# Patient Record
Sex: Male | Born: 1955 | Race: Black or African American | Hispanic: No | Marital: Married | Smoking: Never smoker
Health system: Southern US, Community
[De-identification: ages and names within clinical notes are randomized; demographics above are authoritative.]

## PROBLEM LIST (undated history)

## (undated) DIAGNOSIS — K219 Gastro-esophageal reflux disease without esophagitis: Secondary | ICD-10-CM

## (undated) DIAGNOSIS — I509 Heart failure, unspecified: Secondary | ICD-10-CM

## (undated) DIAGNOSIS — N400 Enlarged prostate without lower urinary tract symptoms: Secondary | ICD-10-CM

## (undated) DIAGNOSIS — I10 Essential (primary) hypertension: Secondary | ICD-10-CM

## (undated) DIAGNOSIS — E785 Hyperlipidemia, unspecified: Secondary | ICD-10-CM

## (undated) HISTORY — PX: PROSTATE SURGERY: SHX751

## (undated) HISTORY — DX: Benign prostatic hyperplasia without lower urinary tract symptoms: N40.0

## (undated) HISTORY — DX: Essential (primary) hypertension: I10

## (undated) HISTORY — PX: CYSTOSCOPY WITH INSERTION OF UROLIFT: SHX6678

## (undated) HISTORY — PX: LASIK: SHX215

## (undated) HISTORY — PX: OTHER SURGICAL HISTORY: SHX169

## (undated) HISTORY — DX: Hyperlipidemia, unspecified: E78.5

## (undated) HISTORY — PX: CARPAL TUNNEL RELEASE: SHX101

---

## 2009-01-18 DIAGNOSIS — I639 Cerebral infarction, unspecified: Secondary | ICD-10-CM

## 2009-01-18 HISTORY — DX: Cerebral infarction, unspecified: I63.9

## 2019-05-17 ENCOUNTER — Telehealth: Payer: Self-pay | Admitting: Medical

## 2019-05-17 NOTE — Telephone Encounter (Signed)
Pt's wife called to schedule this pt for a new appt. She states that pt is having issues with balance and walking. She states that pt has a history of stroke. Wife was advise that pt needed to be assessed today at ER due to his stroke history. She made him a new pt appt with Vincenza Hews for next week anyway. She was advised again to take him to ER.

## 2019-05-24 ENCOUNTER — Encounter: Payer: Self-pay | Admitting: Medical

## 2019-05-24 ENCOUNTER — Other Ambulatory Visit: Payer: Self-pay

## 2019-05-24 ENCOUNTER — Ambulatory Visit: Payer: BC Managed Care – PPO | Admitting: Medical

## 2019-05-24 VITALS — BP 122/84 | HR 81 | Temp 97.9°F | Ht 68.75 in | Wt 168.4 lb

## 2019-05-24 DIAGNOSIS — R42 Dizziness and giddiness: Secondary | ICD-10-CM

## 2019-05-24 DIAGNOSIS — N401 Enlarged prostate with lower urinary tract symptoms: Secondary | ICD-10-CM | POA: Insufficient documentation

## 2019-05-24 DIAGNOSIS — N529 Male erectile dysfunction, unspecified: Secondary | ICD-10-CM

## 2019-05-24 DIAGNOSIS — Z8673 Personal history of transient ischemic attack (TIA), and cerebral infarction without residual deficits: Secondary | ICD-10-CM

## 2019-05-24 DIAGNOSIS — R002 Palpitations: Secondary | ICD-10-CM | POA: Diagnosis not present

## 2019-05-24 DIAGNOSIS — H6501 Acute serous otitis media, right ear: Secondary | ICD-10-CM

## 2019-05-24 DIAGNOSIS — R270 Ataxia, unspecified: Secondary | ICD-10-CM

## 2019-05-24 DIAGNOSIS — R350 Frequency of micturition: Secondary | ICD-10-CM

## 2019-05-24 DIAGNOSIS — I10 Essential (primary) hypertension: Secondary | ICD-10-CM

## 2019-05-24 MED ORDER — MECLIZINE HCL 25 MG PO TABS
25.0000 mg | ORAL_TABLET | Freq: Two times a day (BID) | ORAL | 0 refills | Status: DC
Start: 2019-05-24 — End: 2019-07-09

## 2019-05-24 NOTE — Progress Notes (Signed)
Subjective: Chief Complaint  Patient presents with  . New Patient (Initial Visit)  . Difficulty Walking    balance issuse- feels like falling over    Here as a new patient.   Was living in [place] prior.  Moved here a year ago.  From [place] originally, moved here in 1991.  He notes having a stroke 9 years ago.  Since then he has been doing yearly physical.   Is past due currently for physical given covid.    In recent weeks he notes not feeling balanced when walking.   Recently when standing, felt he has leaning to the left.  His coworker asked if he was feeling ok.  He could tell something was off.  Few days ago coming out of his office, had sudden dizzy spell, lasted 30 seconds.    He notes for the past year, will get a jolt of jumpy feeling of the heart.  Has had this several times this past year but worse recently.  Was once weekly but now this is more frequent.    He had a treadmill stress test almost 2 years ago, normal.    No recently numbness or tingling.  He notes hx/o enlarged prostate.  No incontinence of urine or bowel in general.   He does have prostate issues and uses medicaiton to prevent nocturia.   No vision or hearing change, no slurred speech, no confusion.   No significant headaches.  Drinks no alcohol, nonsmoker lifetime.   Since his stroke 9 years ago he can get some muscle twitching, right index finger will lock up at times, sometimes gets right thigh spasm.  He notes for 3 months having problems getting and keeping erections.  New problem  No CP, no sob, no syncope.  Past Medical History:  Diagnosis Date  . BPH (benign prostatic hyperplasia)   . CVA (cerebral vascular accident) (HCC) 2011  . Hyperlipidemia   . Hypertension    Current Outpatient Medications on File Prior to Visit  Medication Sig Dispense Refill  . aspirin EC 81 MG tablet Take 81 mg by mouth daily.    Marland Kitchen lisinopril-hydrochlorothiazide (ZESTORETIC) 10-12.5 MG tablet Take 1 tablet by mouth  daily.    . simvastatin (ZOCOR) 80 MG tablet Take 80 mg by mouth daily.    . tamsulosin (FLOMAX) 0.4 MG CAPS capsule Take 0.4 mg by mouth.     No current facility-administered medications on file prior to visit.   Family History  Problem Relation Age of Onset  . Hypertension Mother   . Benign prostatic hyperplasia Father   . Other Father        died after horrible bed sore and demise  . Cancer Sister   . Heart disease Neg Hx   . Stroke Neg Hx      ROS as in subjective    Objective: BP 122/84   Pulse 81   Temp 97.9 F (36.6 C)   Ht 5' 8.75" (1.746 m)   Wt 168 lb 6.4 oz (76.4 kg)   SpO2 98%   BMI 25.05 kg/m    General appearence: alert, no distress, WD/WN, [place] American male HEENT: normocephalic, sclerae anicteric, PERRLA, EOMi, nares patent, right TM with serous fluid, left TM and canal normal, no discharge or erythema, pharynx normal Oral cavity: MMM, no lesions Neck: supple, no lymphadenopathy, no thyromegaly, no masses, no JVD, no bruits Heart: RRR, normal S1, S2, no murmurs Lungs: CTA bilaterally, no wheezes, rhonchi, or rales Extremities: no edema, no  cyanosis, no clubbing Pulses: 2+ symmetric, upper and lower extremities, normal cap refill Neurological: alert, oriented x 3, CN2-12 intact, strength normal upper extremities and lower extremities, sensation normal throughout, DTRs 2+ throughout, no cerebellar signs, gait normal Psychiatric: normal affect, behavior normal, pleasant    EKG indication dizziness, palpitations, rate 59 bpm, PR 268ms, QRS 9ms, QTC 352ms, axis 51 degrees, sinus bradycardia with 1st degree AV block, minimal voltage for LVH.   No prior EKG to compare   Assessment: Encounter Diagnoses  Name Primary?  . Dizziness Yes  . Ataxia   . Palpitation   . Essential hypertension   . History of stroke   . Benign prostatic hyperplasia with urinary frequency   . Erectile dysfunction, unspecified erectile dysfunction type   . Non-recurrent  acute serous otitis media of right ear      Plan: We discussed his concerns.  Discussed possible cause of his symptoms.  We discussed several things that can cause dizziness and feeling of ataxia.  He has a history of stroke.  He is on medicines for hypertension and high cholesterol.  He also has serious otitis fluid behind the right TM.  He will use a trial of meclizine in the event this helps.  But if symptoms continue we need to consider head CT or MRI  He has some palpitations or ectopic beats based on symptoms.  EKG reviewed showing first-degree AV block.  No prior EKG to review.  We will send to cardiology for event monitor testing/eval given the symptoms and new ED symptoms  Continue current medications  Advise if worse or new symptoms call otherwise.  Labs today.   Girard Cooter was seen today for new patient (initial visit) and difficulty walking.  Diagnoses and all orders for this visit:  Dizziness -     EKG 12-Lead -     Comprehensive metabolic panel -     TSH -     CBC -     Ambulatory referral to Cardiology  Ataxia -     EKG 12-Lead -     Comprehensive metabolic panel -     TSH -     CBC -     Ambulatory referral to Cardiology  Palpitation -     EKG 12-Lead -     Comprehensive metabolic panel -     TSH -     CBC -     Ambulatory referral to Cardiology  Essential hypertension -     EKG 12-Lead -     Comprehensive metabolic panel -     CBC -     Ambulatory referral to Cardiology  History of stroke  Benign prostatic hyperplasia with urinary frequency  Erectile dysfunction, unspecified erectile dysfunction type  Non-recurrent acute serous otitis media of right ear  Other orders -     meclizine (ANTIVERT) 25 MG tablet; Take 1 tablet (25 mg total) by mouth 2 (two) times daily.

## 2019-05-25 LAB — COMPREHENSIVE METABOLIC PANEL
ALT: 21 IU/L (ref 0–44)
AST: 28 IU/L (ref 0–40)
Albumin/Globulin Ratio: 1.4 (ref 1.2–2.2)
Albumin: 4.5 g/dL (ref 3.8–4.8)
Alkaline Phosphatase: 60 IU/L (ref 39–117)
BUN/Creatinine Ratio: 11 (ref 10–24)
BUN: 12 mg/dL (ref 8–27)
Bilirubin Total: 0.5 mg/dL (ref 0.0–1.2)
CO2: 28 mmol/L (ref 20–29)
Calcium: 9.7 mg/dL (ref 8.6–10.2)
Chloride: 99 mmol/L (ref 96–106)
Creatinine, Ser: 1.1 mg/dL (ref 0.76–1.27)
GFR calc Af Amer: 81 mL/min/{1.73_m2} (ref >59)
GFR calc non Af Amer: 70 mL/min/{1.73_m2} (ref >59)
Globulin, Total: 3.3 g/dL (ref 1.5–4.5)
Glucose: 76 mg/dL (ref 65–99)
Potassium: 4.6 mmol/L (ref 3.5–5.2)
Sodium: 139 mmol/L (ref 134–144)
Total Protein: 7.8 g/dL (ref 6.0–8.5)

## 2019-05-25 LAB — CBC
Hematocrit: 45.7 % (ref 37.5–51.0)
Hemoglobin: 14.8 g/dL (ref 13.0–17.7)
MCH: 27.7 pg (ref 26.6–33.0)
MCHC: 32.4 g/dL (ref 31.5–35.7)
MCV: 86 fL (ref 79–97)
Platelets: 196 10*3/uL (ref 150–450)
RBC: 5.34 x10E6/uL (ref 4.14–5.80)
RDW: 13.1 % (ref 11.6–15.4)
WBC: 3.9 10*3/uL (ref 3.4–10.8)

## 2019-05-25 LAB — TSH: TSH: 0.576 u[IU]/mL (ref 0.450–4.500)

## 2019-06-15 NOTE — Progress Notes (Signed)
Date:  06/20/2019   ID:  Adrian Willis, DOB Jan 17, 1956, MRN [id number]  PCP:  Jac Canavan, PA-C  Cardiologist:  Tessa Lerner, DO, [facility] (established care 06/20/2019)  REASON FOR CONSULT: Palpitation  REQUESTING PHYSICIAN:  Jac Canavan, PA-C [address] [place],  [place] [address]  Chief Complaint  Patient presents with  . Palpitations  . Dizziness  . New Patient (Initial Visit)    HPI  Adrian Willis is a 64 y.o. male who is being seen today for the evaluation of palpitations and dizziness at the request of Genia Del. Patient's past medical history and cardiac risk factors include: Hx of stroke, hypertension, hyperlipidemia, advanced age.   Patient presents the office with a chief complaint of palpitations.  Patient states that the symptoms have been going on for approximately 1 year but have been more frequent.  The last episode was last week when he was try to get out of bed.  The symptoms have been randomly without any precipitating factors.  They last for less than a second and are usually self-limited.  Associated symptoms include dizziness and feeling tired during the episodes.  He denies any anginal chest pain, shortness of breath, or syncopal events.  Patient also notes that he has been not drinking enough water on a daily basis and sometimes skips meals as well.  He does not drink any alcohol nor does consume any illicit drugs.  No new medications or energy drinks.  History of stroke (2011). Denies prior history of coronary artery disease, myocardial infarction, congestive heart failure, deep venous thrombosis, pulmonary embolism.  FUNCTIONAL STATUS: Walks a lot at work; however, no exercise program or daily routine.    ALLERGIES: No Known Allergies  MEDICATION LIST PRIOR TO VISIT: Current Meds  Medication Sig  . aspirin EC 81 MG tablet Take 81 mg by mouth daily.  Marland Kitchen lisinopril-hydrochlorothiazide (ZESTORETIC) 10-12.5 MG tablet Take 1 tablet  by mouth daily.  . simvastatin (ZOCOR) 80 MG tablet Take 80 mg by mouth daily.  . tamsulosin (FLOMAX) 0.4 MG CAPS capsule Take 0.4 mg by mouth.     PAST MEDICAL HISTORY: Past Medical History:  Diagnosis Date  . BPH (benign prostatic hyperplasia)   . CVA (cerebral vascular accident) (HCC) 2011  . Hyperlipidemia   . Hypertension     PAST SURGICAL HISTORY: Past Surgical History:  Procedure Laterality Date  . PROSTATE SURGERY    . thumb      FAMILY HISTORY: The patient family history includes Benign prostatic hyperplasia in his father; Cancer in his sister; Hypertension in his mother; Other in his father.  SOCIAL HISTORY:  The patient  reports that he has never smoked. He has never used smokeless tobacco. He reports that he does not drink alcohol or use drugs.  REVIEW OF SYSTEMS: Review of Systems  Constitution: Negative for chills and fever.  HENT: Negative for hoarse voice and nosebleeds.   Eyes: Negative for discharge, double vision and pain.  Cardiovascular: Positive for palpitations. Negative for chest pain, claudication, dyspnea on exertion, leg swelling, near-syncope, orthopnea, paroxysmal nocturnal dyspnea and syncope.  Respiratory: Negative for hemoptysis and shortness of breath.   Musculoskeletal: Negative for muscle cramps and myalgias.  Gastrointestinal: Negative for abdominal pain, constipation, diarrhea, hematemesis, hematochezia, melena, nausea and vomiting.  Neurological: Positive for dizziness. Negative for light-headedness.    PHYSICAL EXAM: Vitals with BMI 06/20/2019 05/24/2019  Height 5\' 8"  5' 8.75"  Weight 170 lbs 168 lbs 6 oz  BMI 25.85  25.06  Systolic 145 122  Diastolic 87 84  Pulse 88 81   Orthostatic VS for the past 72 hrs (Last 3 readings):  Orthostatic BP Patient Position BP Location Cuff Size Orthostatic Pulse  06/20/19 0933 (!) 140/94 Standing Left Arm Normal 94  06/20/19 0932 142/86 Sitting Left Arm -- 85  06/20/19 0931 128/81 Supine Left Arm  Normal 81  06/20/19 0927 -- Sitting Left Arm Normal --     CONSTITUTIONAL: Well-developed and well-nourished. No acute distress.  SKIN: Skin is warm and dry. No rash noted. No cyanosis. No pallor. No jaundice HEAD: Normocephalic and atraumatic.  EYES: No scleral icterus MOUTH/THROAT: Moist oral membranes.  NECK: No JVD present. No thyromegaly noted. No carotid bruits  LYMPHATIC: No visible cervical adenopathy.  CHEST Normal respiratory effort. No intercostal retractions  LUNGS: Clear to auscultation bilaterally.  no stridor. No wheezes. No rales.  CARDIOVASCULAR: Regular rate and rhythm, positive S1-S2, no murmurs rubs or gallops appreciated ABDOMINAL: Soft, nontender, nondistended, positive bowel in all 4 quadrants.  No apparent ascites.  EXTREMITIES: No peripheral edema  HEMATOLOGIC: No significant bruising NEUROLOGIC: Oriented to person, place, and time. Nonfocal. Normal muscle tone.  PSYCHIATRIC: Normal mood and affect. Normal behavior. Cooperative  CARDIAC DATABASE: EKG: 06/20/2019 normal sinus rhythm, 78 bpm, normal axis, no underlying ischemia or injury pattern.  Echocardiogram: None  Stress Testing: Back in 2005.   Heart Catheterization: None  LABORATORY DATA: CBC Latest Ref Rng & Units 05/24/2019  WBC 3.4 - 10.8 x10E3/uL 3.9  Hemoglobin 13.0 - 17.7 g/dL 57.2  Hematocrit 62.0 - 51.0 % 45.7  Platelets 150 - 450 x10E3/uL 196    CMP Latest Ref Rng & Units 05/24/2019  Glucose 65 - 99 mg/dL 76  BUN 8 - 27 mg/dL 12  Creatinine 3.55 - 9.74 mg/dL 1.63  Sodium 845 - 364 mmol/L 139  Potassium 3.5 - 5.2 mmol/L 4.6  Chloride 96 - 106 mmol/L 99  CO2 20 - 29 mmol/L 28  Calcium 8.6 - 10.2 mg/dL 9.7  Total Protein 6.0 - 8.5 g/dL 7.8  Total Bilirubin 0.0 - 1.2 mg/dL 0.5  Alkaline Phos 39 - 117 IU/L 60  AST 0 - 40 IU/L 28  ALT 0 - 44 IU/L 21    Lipid Panel  No results found for: CHOL, TRIG, HDL, CHOLHDL, VLDL, LDLCALC, LDLDIRECT, LABVLDL  No results found for: HGBA1C No  components found for: NTPROBNP Lab Results  Component Value Date   TSH 0.576 05/24/2019    BMP Recent Labs    05/24/19 1212  NA 139  K 4.6  CL 99  CO2 28  GLUCOSE 76  BUN 12  CREATININE 1.10  CALCIUM 9.7  GFRNONAA 70  GFRAA 81    CBC No results for input(s): WBC, RBC, HGB, HCT, PLT, MCV, MCH, MCHC, RDW, LYMPHSABS, MONOABS, EOSABS, BASOSABS in the last 168 hours.  Invalid input(s): NEUTRABS  HEMOGLOBIN A1C No results found for: HGBA1C, MPG  Cardiac Panel (last 3 results) No results for input(s): CKTOTAL, CKMB, TROPONINI, RELINDX in the last 8760 hours. No results for input(s): TROPIPOC in the last 8760 hours.  BNP (last 3 results) No results for input(s): PROBNP in the last 8760 hours.  TSH Recent Labs    05/24/19 1212  TSH 0.576    CHOLESTEROL No results for input(s): CHOL in the last 8760 hours.  Hepatic Function Panel Recent Labs    05/24/19 1212  PROT 7.8  ALBUMIN 4.5  AST 28  ALT 21  ALKPHOS  60  BILITOT 0.5    IMPRESSION:    ICD-10-CM   1. Palpitations  R00.2 PCV ECHOCARDIOGRAM COMPLETE    EXTERNAL ECG MONITOR (UP TO 30 DAYS) ROCT  2. Essential hypertension  I10 EKG 12-Lead  3. Dizziness  R42 PCV CAROTID DUPLEX (BILATERAL)  4. History of stroke  Z86.73 PCV CAROTID DUPLEX (BILATERAL)  5. Mixed hyperlipidemia  E78.2 PCV CAROTID DUPLEX (BILATERAL)     RECOMMENDATIONS: Zeric Baranowski is a 64 y.o. male whose past medical history and cardiac risk factors include:  Hx of stroke, hypertension, hyperlipidemia, advanced age.  Palpitations: Currently patient is asymptomatic in regards to his palpitations. EKG shows normal sinus rhythm without any ventricular ectopy. Echocardiogram will be ordered to evaluate for structural heart disease and left ventricular systolic function. Plan 14-day mobile cardiac ambulatory telemetry to evaluate for underlying arrhythmia and atrial fibrillation. We also discussed talking to his primary care provider on  splitting the antihypertensive medications to 2 tablets so that he can take 1 morning and 1 in the evening. Patient is asked to eat 3 balanced meals heart healthy.  And also to consume about 6 to 8 glasses of water.  History of stroke: Continue aspirin and statin therapy.  The 14-day mobile cardiac amatory telemetry will also help evaluate for underlying arrhythmia as well atrial fibrillation.  Dizziness: We will check carotid duplex.  Educated on changing positions slowly.  Orthostatic vital signs negative.  Mixed hyperlipidemia: Continue statin therapy.  Currently managed by primary team.  FINAL MEDICATION LIST END OF ENCOUNTER: No orders of the defined types were placed in this encounter.   There are no discontinued medications.   Current Outpatient Medications:  .  aspirin EC 81 MG tablet, Take 81 mg by mouth daily., Disp: , Rfl:  .  lisinopril-hydrochlorothiazide (ZESTORETIC) 10-12.5 MG tablet, Take 1 tablet by mouth daily., Disp: , Rfl:  .  simvastatin (ZOCOR) 80 MG tablet, Take 80 mg by mouth daily., Disp: , Rfl:  .  tamsulosin (FLOMAX) 0.4 MG CAPS capsule, Take 0.4 mg by mouth., Disp: , Rfl:  .  meclizine (ANTIVERT) 25 MG tablet, Take 1 tablet (25 mg total) by mouth 2 (two) times daily. (Patient not taking: Reported on 06/20/2019), Disp: 30 tablet, Rfl: 0  Orders Placed This Encounter  Procedures  . EXTERNAL ECG MONITOR (UP TO 30 DAYS) ROCT  . EKG 12-Lead  . PCV ECHOCARDIOGRAM COMPLETE  . PCV CAROTID DUPLEX (BILATERAL)   --Continue cardiac medications as reconciled in final medication list. --Return in about 5 weeks (around 07/25/2019) for review test results and follow up on palpitations. . Or sooner if needed. --Continue follow-up with your primary care physician regarding the management of your other chronic comorbid conditions.  Patient's questions and concerns were addressed to his satisfaction. He voices understanding of the instructions provided during this encounter.    This note was created using a voice recognition software as a result there may be grammatical errors inadvertently enclosed that do not reflect the nature of this encounter. Every attempt is made to correct such errors.  Rex Kras, [place], [facility]  Pager: [phone] Office: [phone]

## 2019-06-20 ENCOUNTER — Encounter: Payer: Self-pay | Admitting: Cardiology

## 2019-06-20 ENCOUNTER — Ambulatory Visit: Payer: BC Managed Care – PPO | Admitting: Cardiology

## 2019-06-20 ENCOUNTER — Other Ambulatory Visit: Payer: Self-pay

## 2019-06-20 VITALS — BP 145/87 | HR 88 | Ht 68.0 in | Wt 170.0 lb

## 2019-06-20 DIAGNOSIS — R42 Dizziness and giddiness: Secondary | ICD-10-CM

## 2019-06-20 DIAGNOSIS — Z8673 Personal history of transient ischemic attack (TIA), and cerebral infarction without residual deficits: Secondary | ICD-10-CM

## 2019-06-20 DIAGNOSIS — R002 Palpitations: Secondary | ICD-10-CM

## 2019-06-20 DIAGNOSIS — E782 Mixed hyperlipidemia: Secondary | ICD-10-CM

## 2019-06-20 DIAGNOSIS — I10 Essential (primary) hypertension: Secondary | ICD-10-CM

## 2019-06-27 ENCOUNTER — Ambulatory Visit: Payer: BC Managed Care – PPO

## 2019-06-27 ENCOUNTER — Other Ambulatory Visit: Payer: Self-pay

## 2019-06-27 ENCOUNTER — Encounter: Payer: BC Managed Care – PPO | Admitting: Medical

## 2019-06-27 DIAGNOSIS — Z8673 Personal history of transient ischemic attack (TIA), and cerebral infarction without residual deficits: Secondary | ICD-10-CM

## 2019-06-27 DIAGNOSIS — R002 Palpitations: Secondary | ICD-10-CM

## 2019-06-27 DIAGNOSIS — R42 Dizziness and giddiness: Secondary | ICD-10-CM

## 2019-06-27 DIAGNOSIS — E782 Mixed hyperlipidemia: Secondary | ICD-10-CM

## 2019-06-28 ENCOUNTER — Encounter: Payer: Self-pay | Admitting: Medical

## 2019-07-02 NOTE — Progress Notes (Signed)
Pt called back and explain his echo results. Pt undertstood

## 2019-07-02 NOTE — Progress Notes (Signed)
Called pt no answer, left a vm to call back

## 2019-07-09 ENCOUNTER — Telehealth: Payer: Self-pay | Admitting: Medical

## 2019-07-09 ENCOUNTER — Ambulatory Visit: Payer: BC Managed Care – PPO | Admitting: Medical

## 2019-07-09 ENCOUNTER — Other Ambulatory Visit: Payer: Self-pay

## 2019-07-09 ENCOUNTER — Encounter: Payer: Self-pay | Admitting: Medical

## 2019-07-09 VITALS — BP 140/80 | HR 88 | Ht 68.5 in | Wt 165.6 lb

## 2019-07-09 DIAGNOSIS — Z1211 Encounter for screening for malignant neoplasm of colon: Secondary | ICD-10-CM

## 2019-07-09 DIAGNOSIS — Z0000 Encounter for general adult medical examination without abnormal findings: Secondary | ICD-10-CM

## 2019-07-09 DIAGNOSIS — Z23 Encounter for immunization: Secondary | ICD-10-CM | POA: Diagnosis not present

## 2019-07-09 DIAGNOSIS — Z7189 Other specified counseling: Secondary | ICD-10-CM | POA: Diagnosis not present

## 2019-07-09 DIAGNOSIS — I10 Essential (primary) hypertension: Secondary | ICD-10-CM

## 2019-07-09 DIAGNOSIS — Z125 Encounter for screening for malignant neoplasm of prostate: Secondary | ICD-10-CM

## 2019-07-09 DIAGNOSIS — E785 Hyperlipidemia, unspecified: Secondary | ICD-10-CM | POA: Insufficient documentation

## 2019-07-09 DIAGNOSIS — Z7185 Encounter for immunization safety counseling: Secondary | ICD-10-CM | POA: Insufficient documentation

## 2019-07-09 MED ORDER — LISINOPRIL-HYDROCHLOROTHIAZIDE 10-12.5 MG PO TABS: 1.0000 | ORAL_TABLET | Freq: Every day | ORAL | 3 refills | Status: DC

## 2019-07-09 MED ORDER — TAMSULOSIN HCL 0.4 MG PO CAPS: 0.4000 mg | ORAL_CAPSULE | Freq: Every day | ORAL | 0 refills | Status: DC

## 2019-07-09 MED ORDER — SIMVASTATIN 80 MG PO TABS: 80.0000 mg | ORAL_TABLET | Freq: Every day | ORAL | 0 refills | Status: DC

## 2019-07-09 MED ORDER — ASPIRIN 325 MG PO TABS: 325.0000 mg | ORAL_TABLET | Freq: Every day | ORAL | 3 refills | Status: DC

## 2019-07-09 NOTE — Telephone Encounter (Signed)
Pt needs refill on all 4 [facility]

## 2019-07-09 NOTE — Addendum Note (Signed)
Addended by: Jac Canavan on: 07/09/2019 05:02 PM   Modules accepted: Orders

## 2019-07-09 NOTE — Progress Notes (Signed)
Subjective:   HPI  Adrian Willis is a 64 y.o. male who presents for Chief Complaint  Patient presents with  . cpe fasting    cpe, wants cholesterol rechecked    Patient Care Team: Merrell Rettinger, Camelia Eng, PA-C as PCP - General (Family Medicine) Sees dentist Sees eye doctor Cardiology, Dr. Rex Kras   Concerns: He recently saw cardiology.  He had an echocardiogram.  He is using event monitor currently  He had his Covid shots already  He has a history of prostate cancer therapy.  Last urology consult 2 years ago.  He had a "urotrap" pellet treatment prior  No new complaints  Reviewed their medical, surgical, family, social, medication, and allergy history and updated chart as appropriate.  Past Medical History:  Diagnosis Date  . BPH (benign prostatic hyperplasia)   . CVA (cerebral vascular accident) ([place]) 2011  . Hyperlipidemia   . Hypertension     Past Surgical History:  Procedure Laterality Date  . PROSTATE SURGERY    . thumb     right, nerve injury repair    Social History   Socioeconomic History  . Marital status: Married    Spouse name: Not on file  . Number of children: Not on file  . Years of education: Not on file  . Highest education level: Not on file  Occupational History  . Not on file  Tobacco Use  . Smoking status: Never Smoker  . Smokeless tobacco: Never Used  Vaping Use  . Vaping Use: Never used  Substance and Sexual Activity  . Alcohol use: Never  . Drug use: Never  . Sexual activity: Not on file  Other Topics Concern  . Not on file  Social History Narrative   Lives with wife, no children, works in [employer].  Walks a lot at work.   06/2019   Social Determinants of Health   Financial Resource Strain:   . Difficulty of Paying Living Expenses:   Food Insecurity:   . Worried About Charity fundraiser in the Last Year:   . Arboriculturist in the Last Year:   Transportation Needs:   . Film/video editor (Medical):   Marland Kitchen Lack of  Transportation (Non-Medical):   Physical Activity:   . Days of Exercise per Week:   . Minutes of Exercise per Session:   Stress:   . Feeling of Stress :   Social Connections:   . Frequency of Communication with Friends and Family:   . Frequency of Social Gatherings with Friends and Family:   . Attends Religious Services:   . Active Member of Clubs or Organizations:   . Attends Archivist Meetings:   Marland Kitchen Marital Status:   Intimate Partner Violence:   . Fear of Current or Ex-Partner:   . Emotionally Abused:   Marland Kitchen Physically Abused:   . Sexually Abused:     Family History  Problem Relation Age of Onset  . Hypertension Mother   . Benign prostatic hyperplasia Father   . Other Father        died after horrible bed sore and demise  . Cancer Sister   . Heart disease Neg Hx   . Stroke Neg Hx      Current Outpatient Medications:  .  aspirin 325 MG tablet, Take 325 mg by mouth daily., Disp: , Rfl:  .  lisinopril-hydrochlorothiazide (ZESTORETIC) 10-12.5 MG tablet, Take 1 tablet by mouth daily., Disp: , Rfl:  .  simvastatin (ZOCOR) 80 MG  tablet, Take 80 mg by mouth daily., Disp: , Rfl:  .  tamsulosin (FLOMAX) 0.4 MG CAPS capsule, Take 0.4 mg by mouth., Disp: , Rfl:   No Known Allergies     Review of Systems Constitutional: -fever, -chills, -sweats, -unexpected weight change, -decreased appetite, -fatigue Allergy: -sneezing, -itching, -congestion Dermatology: -changing moles, --rash, -lumps ENT: -runny nose, -ear pain, -sore throat, -hoarseness, -sinus pain, -teeth pain, - ringing in ears, -hearing loss, -nosebleeds Cardiology: -chest pain, -palpitations, -swelling, -difficulty breathing when lying flat, -waking up short of breath Respiratory: -cough, -shortness of breath, -difficulty breathing with exercise or exertion, -wheezing, -coughing up blood Gastroenterology: -abdominal pain, -nausea, -vomiting, -diarrhea, -constipation, -blood in stool, -changes in bowel movement,  -difficulty swallowing or eating Hematology: -bleeding, -bruising  Musculoskeletal: -joint aches, -muscle aches, -joint swelling, -back pain, -neck pain, -cramping, -changes in gait Ophthalmology: denies vision changes, eye redness, itching, discharge Urology: -burning with urination, -difficulty urinating, -blood in urine, -urinary frequency, -urgency, -incontinence Neurology: -headache, -weakness, -tingling, -numbness, -memory loss, -falls, -dizziness Psychology: -depressed mood, -agitation, -sleep problems Male GU: no testicular mass, pain, no lymph nodes swollen, no swelling, no rash.     Objective:  BP 140/80   Pulse 88   Ht 5' 8.5" (1.74 m)   Wt 165 lb 9.6 oz (75.1 kg)   SpO2 99%   BMI 24.81 kg/m   General appearance: alert, no distress, WD/WN, [place] American male Skin: Unremarkable Neck: supple, no lymphadenopathy, no thyromegaly, no masses, normal ROM, no bruits Chest: non tender, normal shape and expansion Heart: RRR, normal S1, S2, no murmurs Lungs: CTA bilaterally, no wheezes, rhonchi, or rales Abdomen: +bs, soft, non tender, non distended, no masses, no hepatomegaly, no splenomegaly, no bruits Back: non tender, normal ROM, no scoliosis Musculoskeletal: upper extremities non tender, no obvious deformity, normal ROM throughout, lower extremities non tender, no obvious deformity, normal ROM throughout Extremities: no edema, no cyanosis, no clubbing Pulses: 2+ symmetric, upper and lower extremities, normal cap refill Neurological: alert, oriented x 3, CN2-12 intact, strength normal upper extremities and lower extremities, sensation normal throughout, DTRs 2+ throughout, no cerebellar signs, gait normal Psychiatric: normal affect, behavior normal, pleasant  GU: normal male external genitalia,circumcised, nontender, no masses, no hernia, no lymphadenopathy Rectal: Anus normal tone, prostate seems enlarged, no distinct nodule   Assessment and Plan :   Encounter Diagnoses   Name Primary?  . Encounter for health maintenance examination in adult Yes  . Vaccine counseling   . Need for pneumococcal vaccination   . Screening for prostate cancer   . Screen for colon cancer   . Essential hypertension, benign   . Hyperlipidemia, unspecified hyperlipidemia type     Physical exam - discussed and counseled on healthy lifestyle, diet, exercise, preventative care, vaccinations, sick and well care, proper use of emergency dept and after hours care, and addressed their concerns.    Health screening: See your eye doctor yearly for routine vision care. See your dentist yearly for routine dental care including hygiene visits twice yearly.  Cancer screening Colonoscopy:  Referred for screening colonogard  Discussed PSA, prostate exam, and prostate cancer screening risks/benefits.     Discussed regular skin surveillance   Vaccinations: Advised yearly influenza vaccine He reports Td vaccine 2020. He recently had Covid vaccine.  We will document Counseled on the pneumococcal vaccine.  Vaccine information sheet given.  Pneumococcal vaccine Prevnar 13 given after consent obtained. Counseled on Shingrix as well.  He will consider.    Separate significant chronic issues  discussed:  Hypertension-compliant medication, reviewed his recent echocardiogram with decreased ejection fraction  Hyperlipidemia compliant medication  Palpitations, hypertension, follow-up with cardiology    [name] was seen today for cpe fasting.  Diagnoses and all orders for this visit:  Encounter for health maintenance examination in adult -     Lipid panel -     HIV Antibody (routine testing w rflx) -     Hepatitis C antibody -     PSA  Vaccine counseling  Need for pneumococcal vaccination  Screening for prostate cancer -     PSA  Screen for colon cancer -     Cologuard  Essential hypertension, benign  Hyperlipidemia, unspecified hyperlipidemia type  Other orders -      Pneumococcal conjugate vaccine 13-valent   Follow-up pending labs, yearly for physical

## 2019-07-10 LAB — LIPID PANEL
Chol/HDL Ratio: 2.3 ratio (ref 0.0–5.0)
Cholesterol, Total: 144 mg/dL (ref 100–199)
HDL: 62 mg/dL (ref >39)
LDL Chol Calc (NIH): 72 mg/dL (ref 0–99)
Triglycerides: 46 mg/dL (ref 0–149)
VLDL Cholesterol Cal: 10 mg/dL (ref 5–40)

## 2019-07-10 LAB — HIV ANTIBODY (ROUTINE TESTING W REFLEX): HIV Screen 4th Generation wRfx: NONREACTIVE

## 2019-07-10 LAB — HEPATITIS C ANTIBODY: Hep C Virus Ab: <0.1 s/co ratio (ref 0.0–0.9)

## 2019-07-10 LAB — PSA: Prostate Specific Ag, Serum: 2.5 ng/mL (ref 0.0–4.0)

## 2019-07-12 ENCOUNTER — Other Ambulatory Visit: Payer: Self-pay

## 2019-07-12 DIAGNOSIS — R972 Elevated prostate specific antigen [PSA]: Secondary | ICD-10-CM

## 2019-07-18 LAB — EXTERNAL GENERIC LAB PROCEDURE

## 2019-07-18 LAB — COLOGUARD

## 2019-07-19 ENCOUNTER — Telehealth: Payer: Self-pay | Admitting: Medical

## 2019-07-19 NOTE — Telephone Encounter (Signed)
Cologuard came in showing samples submitted was not appropriate so they are going to reach out to him to repeat another 1

## 2019-07-30 LAB — COLOGUARD: Cologuard: NEGATIVE

## 2019-07-31 ENCOUNTER — Encounter: Payer: Self-pay | Admitting: Medical

## 2019-07-31 LAB — COLOGUARD

## 2019-08-01 ENCOUNTER — Encounter: Payer: Self-pay | Admitting: Cardiology

## 2019-08-01 ENCOUNTER — Other Ambulatory Visit: Payer: Self-pay

## 2019-08-01 ENCOUNTER — Ambulatory Visit: Payer: BC Managed Care – PPO | Admitting: Cardiology

## 2019-08-01 ENCOUNTER — Other Ambulatory Visit (INDEPENDENT_AMBULATORY_CARE_PROVIDER_SITE_OTHER): Payer: BC Managed Care – PPO

## 2019-08-01 ENCOUNTER — Telehealth: Payer: Self-pay

## 2019-08-01 VITALS — BP 129/87 | HR 68 | Resp 15 | Ht 68.0 in | Wt 164.4 lb

## 2019-08-01 DIAGNOSIS — Z8673 Personal history of transient ischemic attack (TIA), and cerebral infarction without residual deficits: Secondary | ICD-10-CM

## 2019-08-01 DIAGNOSIS — R0789 Other chest pain: Secondary | ICD-10-CM

## 2019-08-01 DIAGNOSIS — Z23 Encounter for immunization: Secondary | ICD-10-CM | POA: Diagnosis not present

## 2019-08-01 DIAGNOSIS — Z712 Person consulting for explanation of examination or test findings: Secondary | ICD-10-CM

## 2019-08-01 DIAGNOSIS — I10 Essential (primary) hypertension: Secondary | ICD-10-CM

## 2019-08-01 DIAGNOSIS — E782 Mixed hyperlipidemia: Secondary | ICD-10-CM

## 2019-08-01 DIAGNOSIS — I5020 Unspecified systolic (congestive) heart failure: Secondary | ICD-10-CM

## 2019-08-01 DIAGNOSIS — R0609 Other forms of dyspnea: Secondary | ICD-10-CM

## 2019-08-01 MED ORDER — NITROGLYCERIN 0.4 MG SL SUBL: 0.4000 mg | SUBLINGUAL_TABLET | SUBLINGUAL | 0 refills | Status: AC | PRN

## 2019-08-01 MED ORDER — METOPROLOL TARTRATE 25 MG PO TABS: 25.0000 mg | ORAL_TABLET | Freq: Two times a day (BID) | ORAL | 0 refills | Status: DC

## 2019-08-01 NOTE — Patient Instructions (Signed)
Stop metoprolol two days before the stress test and start after the stress test.

## 2019-08-01 NOTE — Telephone Encounter (Signed)
Informed patient that lab work has been ordered per Dr. Odis Hollingshead and patient needs to go to local [facility] by 08/20/2019 to have results before follow up appt. Patient voiced understanding.

## 2019-08-01 NOTE — Progress Notes (Signed)
Date:  08/01/2019   ID:  Adrian Willis, DOB 01-27-1955, MRN [id number]  PCP:  Jac Canavan, PA-C  Cardiologist:  Tessa Lerner, DO, [facility] (established care 06/20/2019)  REASON FOR CONSULT: Palpitation  REQUESTING PHYSICIAN:  Jac Canavan, PA-C [address] [place],  [place] [address]  Chief Complaint  Patient presents with  . Follow-up    5 week  . Palpitations  . Results    HPI  Adrian Willis is a 64 y.o. male who presents to the office with a chief complaint of "dyspnea on exertion, palpitations, review test results."  Patient's past medical history and cardiovascular risk factors include history of stroke, hypertension, hyperlipidemia, and newly discovered heart failure with preserved EF, stage B, NYHA class II/III, advanced age.     He was referred to the office at the request of his primary care provider for evaluation of palpitations back in June 2021.  Since then patient has undergone diagnostic work-up which included an echocardiogram, carotid duplex, and a cardiac event monitor.  Results of office 3 diagnostic test were reviewed with the patient in great detail at today's office visit.  His cardiac event monitor did not show any findings of significant dysrhythmias.  His carotid duplex was essentially within normal limits.  However, patient does have moderately reduced left ventricular systolic function.  When asked the patient states that he does have symptoms of effort related dyspnea.  He states that he had several episodes of when he has had unexplained effort related dyspnea.  He states that one time he was mowing his lawn and he just ran out of air and he had to rest and relax for him to go back to his baseline.  Patient states that at work he was trying to ambulate on a flat surface and does experience effort related dyspnea.  And one time when he was trying to go to the mailbox he just felt exhausted as well.  Patient denies any chest pain at rest or with  effort related activities.  History of stroke (2011). Denies prior history of coronary artery disease, myocardial infarction, congestive heart failure, deep venous thrombosis, pulmonary embolism.  FUNCTIONAL STATUS: Walks a lot at work; however, no exercise program or daily routine.    ALLERGIES: No Known Allergies  MEDICATION LIST PRIOR TO VISIT: Current Meds  Medication Sig  . aspirin 325 MG tablet Take 1 tablet (325 mg total) by mouth daily.  Marland Kitchen lisinopril-hydrochlorothiazide (ZESTORETIC) 10-12.5 MG tablet Take 1 tablet by mouth daily.  . simvastatin (ZOCOR) 80 MG tablet Take 1 tablet (80 mg total) by mouth daily.  . tamsulosin (FLOMAX) 0.4 MG CAPS capsule Take 1 capsule (0.4 mg total) by mouth daily after supper.     PAST MEDICAL HISTORY: Past Medical History:  Diagnosis Date  . BPH (benign prostatic hyperplasia)   . CVA (cerebral vascular accident) ([facility]) 2011  . Hyperlipidemia   . Hypertension     PAST SURGICAL HISTORY: Past Surgical History:  Procedure Laterality Date  . PROSTATE SURGERY    . thumb     right, nerve injury repair    FAMILY HISTORY: The patient family history includes Benign prostatic hyperplasia in his father; Cancer in his sister; Hypertension in his mother; Other in his father.  SOCIAL HISTORY:  The patient  reports that he has never smoked. He has never used smokeless tobacco. He reports that he does not drink alcohol and does not use drugs.  REVIEW OF SYSTEMS: Review of Systems  Constitutional:  Negative for chills and fever.  HENT: Negative for hoarse voice and nosebleeds.   Eyes: Negative for discharge, double vision and pain.  Cardiovascular: Positive for chest pain, dyspnea on exertion and palpitations. Negative for claudication, leg swelling, near-syncope, orthopnea, paroxysmal nocturnal dyspnea and syncope.  Respiratory: Negative for hemoptysis and shortness of breath.   Musculoskeletal: Negative for muscle cramps and myalgias.    Gastrointestinal: Negative for abdominal pain, constipation, diarrhea, hematemesis, hematochezia, melena, nausea and vomiting.  Neurological: Positive for dizziness. Negative for light-headedness.    PHYSICAL EXAM: Vitals with BMI 08/01/2019 07/09/2019 06/20/2019  Height 5\' 8"  5' 8.5" 5\' 8"   Weight 164 lbs 6 oz 165 lbs 10 oz 170 lbs  BMI 25 24.81 25.85  Systolic 129 140  Diastolic 87 80 87  Pulse 68 88 88   CONSTITUTIONAL: Well-developed and well-nourished. No acute distress.  SKIN: Skin is warm and dry. No rash noted. No cyanosis. No pallor. No jaundice HEAD: Normocephalic and atraumatic.  EYES: No scleral icterus MOUTH/THROAT: Moist oral membranes.  NECK: No JVD present. No thyromegaly noted. No carotid bruits  LYMPHATIC: No visible cervical adenopathy.  CHEST Normal respiratory effort. No intercostal retractions  LUNGS: Clear to auscultation bilaterally.  no stridor. No wheezes. No rales.  CARDIOVASCULAR: Regular rate and rhythm, positive S1-S2, no murmurs rubs or gallops appreciated ABDOMINAL: Soft, nontender, nondistended, positive bowel in all 4 quadrants.  No apparent ascites.  EXTREMITIES: No peripheral edema  HEMATOLOGIC: No significant bruising NEUROLOGIC: Oriented to person, place, and time. Nonfocal. Normal muscle tone.  PSYCHIATRIC: Normal mood and affect. Normal behavior. Cooperative  CARDIAC DATABASE: EKG: 06/20/2019 normal sinus rhythm, 78 bpm, normal axis, no underlying ischemia or injury pattern.  Echocardiogram: 06/27/2019: Moderate global hypokinesis. LVEF 35-40%. Doppler evidence of grade I (impaired) diastolic dysfunction, normal LAP. Mild TR.   Stress Testing: Back in 2005.   Heart Catheterization: None  Event monitor 06/27/2019 - 07/11/2019: Diagnostic time: 100%  Dominant rhythm: Normal Sinus. HR 51-154 bpm. Avg HR 80 bpm. No atrial fibrillation, high grade AV block, or sinus pause >3sec noted. Heart rate >100bpm for approximately 9% of the  monitoring period.  Heart rate <60bpm for <1% of the monitoring period.  Total ventricular ectopy <1%.  No patient triggered events recorded.   Carotid Duplex 06/27/2019: No hemodynamically significant arterial disease in the internal carotid artery bilaterally. Minimal increased intimal thickness bilaterally. Antegrade right vertebral artery flow. Antegrade left vertebral artery flow.  LABORATORY DATA: CBC Latest Ref Rng & Units 05/24/2019  WBC 3.4 - 10.8 x10E3/uL 3.9  Hemoglobin 13.0 - 17.7 g/dL 08/27/2019  Hematocrit 07/24/2019 - 51.0 % 45.7  Platelets 150 - 450 x10E3/uL 196    CMP Latest Ref Rng & Units 05/24/2019  Glucose 65 - 99 mg/dL 76  BUN 8 - 27 mg/dL 12  Creatinine 97.6 - 07/24/2019 mg/dL 7.34  Sodium 1.93 - 7.90 mmol/L 139  Potassium 3.5 - 5.2 mmol/L 4.6  Chloride 96 - 106 mmol/L 99  CO2 20 - 29 mmol/L 28  Calcium 8.6 - 10.2 mg/dL 9.7  Total Protein 6.0 - 8.5 g/dL 7.8  Total Bilirubin 0.0 - 1.2 mg/dL 0.5  Alkaline Phos 39 - 117 IU/L 60  AST 0 - 40 IU/L 28  ALT 0 - 44 IU/L 21    Lipid Panel     Component Value Date/Time   CHOL 144 07/09/2019 1300   TRIG 46 07/09/2019 1300   HDL 62 07/09/2019 1300   CHOLHDL 2.3 07/09/2019 1300   LDLCALC  72 07/09/2019 1300   LABVLDL 10 07/09/2019 1300    No results found for: HGBA1C No components found for: NTPROBNP Lab Results  Component Value Date   TSH 0.576 05/24/2019    BMP Recent Labs    05/24/19 1212  NA 139  K 4.6  CL 99  CO2 28  GLUCOSE 76  BUN 12  CREATININE 1.10  CALCIUM 9.7  GFRNONAA 70  GFRAA 81    CBC No results for input(s): WBC, RBC, HGB, HCT, PLT, MCV, MCH, MCHC, RDW, LYMPHSABS, MONOABS, EOSABS, BASOSABS in the last 168 hours.  Invalid input(s): NEUTRABS  HEMOGLOBIN A1C No results found for: HGBA1C, MPG  Cardiac Panel (last 3 results) No results for input(s): CKTOTAL, CKMB, TROPONINI, RELINDX in the last 8760 hours. No results for input(s): TROPIPOC in the last 8760 hours.  BNP (last 3 results) No  results for input(s): PROBNP in the last 8760 hours.  TSH Recent Labs    05/24/19 1212  TSH 0.576    CHOLESTEROL Recent Labs    07/09/19 1300  CHOL 144    Hepatic Function Panel Recent Labs    05/24/19 1212  PROT 7.8  ALBUMIN 4.5  AST 28  ALT 21  ALKPHOS 60  BILITOT 0.5    IMPRESSION:    ICD-10-CM   1. Dyspnea on exertion  R06.00 Basic metabolic panel    Magnesium    Pro b natriuretic peptide (BNP)    Pro b natriuretic peptide (BNP)    Magnesium    Basic metabolic panel  2. Chest heaviness  R07.89 nitroGLYCERIN (NITROSTAT) 0.4 MG SL tablet  3. Essential hypertension  I10 PCV MYOCARDIAL PERFUSION WO LEXISCAN  4. History of stroke  Z86.73 PCV MYOCARDIAL PERFUSION WO LEXISCAN  5. Mixed hyperlipidemia  E78.2   6. HFrEF (heart failure with reduced ejection fraction) ([facility]CC)  I50.20 metoprolol tartrate (LOPRESSOR) 25 MG tablet    PCV MYOCARDIAL PERFUSION WO LEXISCAN    Basic metabolic panel    Magnesium    Pro b natriuretic peptide (BNP)    Pro b natriuretic peptide (BNP)    Magnesium    Basic metabolic panel     RECOMMENDATIONS: Adrian Willis is a 64 y.o. male whose past medical history and cardiac risk factors include:  Hx of stroke, hypertension, hyperlipidemia, advanced age.  Dyspnea on exertion:  Reviewed the results of the monitor, carotid duplex, and the echocardiogram.  When he was notified that his LVEF is reduced compared to baseline patient states that he does have symptoms of effort later dyspnea which she had forgotten to mention at the last visit.  Patient states that he has effort related dyspnea frequently during which times he feels that he just runs out of breath.  I would like to start him on heart failure medications but prior to that would like to rule out ischemia as he also started describing episodes of chest heaviness at times.  Patient is asked to bring his medications in with him at the next visit.  We will check a BNP, magnesium, NT  proBNP prior to the next office visit with the hopes of starting him on Entresto.  We will start metoprolol 25 mg p.o. twice daily.  Heart failure with reduced EF, stage C, NYHA class II/III: See above  Chest heaviness: We will start beta-blocker 25 mg of Lopressor twice daily.  Nuclear stress test to evaluate for reversible ischemia.  Patient wanted to hold off on coronary CCTA at this time.  Sublingual nitroglycerin  tablets to use for as needed basis.  Medication profile discussed.  Patient understands that he should not be on phosphodiesterase 5 inhibitor such as sildenafil/Viagra, Cialis/tadalafil, Levitra/vardenafil.  Benign essential hypertension: Medications reviewed.  Currently managed per primary team.  History of stroke: Continue aspirin and statin therapy.  The 14-day mobile cardiac amatory telemetry will also help evaluate for underlying arrhythmia as well atrial fibrillation.  Dizziness: Carotid duplex results reviewed with the patient.  He is asked to discuss this further with his primary care provider for noncardiac causes.  Mixed hyperlipidemia: Continue statin therapy.  Currently managed by primary team.  FINAL MEDICATION LIST END OF ENCOUNTER: Meds ordered this encounter  Medications  . metoprolol tartrate (LOPRESSOR) 25 MG tablet    Sig: Take 1 tablet (25 mg total) by mouth 2 (two) times daily. Hold if systolic blood pressure (top blood pressure number) less than 100 mmHg or heart rate less than 60 bpm (pulse)    Dispense:  180 tablet    Refill:  0  . nitroGLYCERIN (NITROSTAT) 0.4 MG SL tablet    Sig: Place 1 tablet (0.4 mg total) under the tongue every 5 (five) minutes as needed for chest pain. If you require more than two tablets five minutes apart go to the nearest ER via EMS.    Dispense:  30 tablet    Refill:  0    There are no discontinued medications.   Current Outpatient Medications:  .  aspirin 325 MG tablet, Take 1 tablet (325 mg total) by mouth daily.,  Disp: 90 tablet, Rfl: 3 .  lisinopril-hydrochlorothiazide (ZESTORETIC) 10-12.5 MG tablet, Take 1 tablet by mouth daily., Disp: 90 tablet, Rfl: 3 .  simvastatin (ZOCOR) 80 MG tablet, Take 1 tablet (80 mg total) by mouth daily., Disp: 90 tablet, Rfl: 0 .  tamsulosin (FLOMAX) 0.4 MG CAPS capsule, Take 1 capsule (0.4 mg total) by mouth daily after supper., Disp: 90 capsule, Rfl: 0 .  ascorbic acid (VITAMIN C) 1000 MG tablet, Take by mouth., Disp: , Rfl:  .  metoprolol tartrate (LOPRESSOR) 25 MG tablet, Take 1 tablet (25 mg total) by mouth 2 (two) times daily. Hold if systolic blood pressure (top blood pressure number) less than 100 mmHg or heart rate less than 60 bpm (pulse), Disp: 180 tablet, Rfl: 0 .  nitroGLYCERIN (NITROSTAT) 0.4 MG SL tablet, Place 1 tablet (0.4 mg total) under the tongue every 5 (five) minutes as needed for chest pain. If you require more than two tablets five minutes apart go to the nearest ER via EMS., Disp: 30 tablet, Rfl: 0  Orders Placed This Encounter  Procedures  . Basic metabolic panel  . Magnesium  . Pro b natriuretic peptide (BNP)  . PCV MYOCARDIAL PERFUSION WO LEXISCAN   --Continue cardiac medications as reconciled in final medication list. --Return in about 4 weeks (around 08/29/2019) for re-evaluation of symptoms., review test results.. Or sooner if needed. --Continue follow-up with your primary care physician regarding the management of your other chronic comorbid conditions.  Patient's questions and concerns were addressed to his satisfaction. He voices understanding of the instructions provided during this encounter.   This note was created using a voice recognition software as a result there may be grammatical errors inadvertently enclosed that do not reflect the nature of this encounter. Every attempt is made to correct such errors.  Tessa LernerSunit Hillari Zumwalt, [place]DO, Ladd Memorial HospitalFACC  Pager: [phone] Office: [phone]

## 2019-08-04 LAB — COLOGUARD: COLOGUARD: NEGATIVE

## 2019-08-04 LAB — EXTERNAL GENERIC LAB PROCEDURE: COLOGUARD: NEGATIVE

## 2019-08-08 ENCOUNTER — Telehealth: Payer: Self-pay | Admitting: Medical

## 2019-08-08 NOTE — Telephone Encounter (Signed)
Please let them know that the Cologuard screening for colon cancer was negative.  This indicates a lower likelihood that colorectal cancer is present.   Lets plan to repeat this in 3 years.  However, if the develop bowel changes, blood in stool, unexpected weight loss, or other new bowel changes, then recheck. 

## 2019-08-08 NOTE — Telephone Encounter (Signed)
Patient has been informed of result.  

## 2019-08-11 LAB — BASIC METABOLIC PANEL
BUN/Creatinine Ratio: 8 — ABNORMAL LOW (ref 10–24)
BUN: 10 mg/dL (ref 8–27)
CO2: 26 mmol/L (ref 20–29)
Calcium: 9.3 mg/dL (ref 8.6–10.2)
Chloride: 101 mmol/L (ref 96–106)
Creatinine, Ser: 1.29 mg/dL — ABNORMAL HIGH (ref 0.76–1.27)
GFR calc Af Amer: 67 mL/min/{1.73_m2} (ref >59)
GFR calc non Af Amer: 58 mL/min/{1.73_m2} — ABNORMAL LOW (ref >59)
Glucose: 90 mg/dL (ref 65–99)
Potassium: 4.2 mmol/L (ref 3.5–5.2)
Sodium: 139 mmol/L (ref 134–144)

## 2019-08-11 LAB — PRO B NATRIURETIC PEPTIDE: NT-Pro BNP: 35 pg/mL (ref 0–376)

## 2019-08-11 LAB — MAGNESIUM: Magnesium: 2.1 mg/dL (ref 1.6–2.3)

## 2019-08-14 ENCOUNTER — Encounter: Payer: Self-pay | Admitting: Family Medicine

## 2019-08-14 ENCOUNTER — Ambulatory Visit
Admission: EM | Admit: 2019-08-14 | Discharge: 2019-08-14 | Disposition: A | Discharge status: Home / Self Care | Payer: BC Managed Care – PPO | Attending: Family Medicine | Admitting: Family Medicine

## 2019-08-14 DIAGNOSIS — R6883 Chills (without fever): Secondary | ICD-10-CM

## 2019-08-14 DIAGNOSIS — R110 Nausea: Secondary | ICD-10-CM

## 2019-08-14 DIAGNOSIS — R42 Dizziness and giddiness: Secondary | ICD-10-CM

## 2019-08-14 LAB — POCT URINALYSIS DIP (MANUAL ENTRY)
Bilirubin, UA: NEGATIVE
Blood, UA: NEGATIVE
Glucose, UA: NEGATIVE mg/dL
Ketones, POC UA: NEGATIVE mg/dL
Leukocytes, UA: NEGATIVE
Nitrite, UA: NEGATIVE
Protein Ur, POC: NEGATIVE mg/dL
Spec Grav, UA: 1.01 (ref 1.010–1.025)
Urobilinogen, UA: 0.2 E.U./dL
pH, UA: 5 (ref 5.0–8.0)

## 2019-08-14 NOTE — Discharge Instructions (Addendum)
Your EKG was not concerning and your urine was normal Make sure that you are staying hydrated  Eats small meals Rest Covid swab pending. We should have the results in the morning.  Follow up with your doctor tomorrow as planned If the dizziness worsens or you start having other concerning symptoms you will need to go to the ER.

## 2019-08-14 NOTE — ED Triage Notes (Signed)
Pt states he feels off balance, jittery, sweating and nausea.  Pt has a hx of these s/s but states it is worse this morning.  States he had a smoothie this morning.

## 2019-08-15 ENCOUNTER — Ambulatory Visit: Payer: BC Managed Care – PPO

## 2019-08-15 ENCOUNTER — Other Ambulatory Visit: Payer: Self-pay

## 2019-08-15 DIAGNOSIS — I10 Essential (primary) hypertension: Secondary | ICD-10-CM

## 2019-08-15 DIAGNOSIS — I5020 Unspecified systolic (congestive) heart failure: Secondary | ICD-10-CM

## 2019-08-15 DIAGNOSIS — Z8673 Personal history of transient ischemic attack (TIA), and cerebral infarction without residual deficits: Secondary | ICD-10-CM

## 2019-08-15 LAB — NOVEL CORONAVIRUS, NAA: SARS-CoV-2, NAA: NOT DETECTED

## 2019-08-15 LAB — SARS-COV-2, NAA 2 DAY TAT

## 2019-08-15 NOTE — ED Provider Notes (Signed)
[facility]    CSN: [id number] Arrival date & time: 08/14/19  4098      History   Chief Complaint No chief complaint on file.   HPI Adrian Willis is a 64 y.o. male.   Pt is a 64 year old male that presents with past medical history of CVA, hyperlipidemia, hypertension, BPH.  Presents today with not feeling well, off balance, jittery sweating and nausea for past 2 days.  Worse this morning.  Felt better after eating a smoothie for breakfast.  Had mild cough and scratchy throat.  Vaccinated but concerned due possible exposures at work.  Has had some intermittent numbness and tingling in the left side of his face over the past couple weeks.  No numbness or tingling currently.  He is also had some mild intermittent chest pressure.  He is currently being seen by cardiology and has a stress test scheduled for tomorrow.  Had recent echocardiogram.  Please prescribe nitro to take as needed.  No current chest pain or shortness of breath.  Felt off balance at work today.  Denies any weakness, slurred speech, vision changes.  She wants  ROS per HPI      Past Medical History:  Diagnosis Date  . BPH (benign prostatic hyperplasia)   . CVA (cerebral vascular accident) (HCC) 2011  . Hyperlipidemia   . Hypertension     Patient Active Problem List   Diagnosis Date Noted  . Hyperlipidemia 07/09/2019  . Screen for colon cancer 07/09/2019  . Need for pneumococcal vaccination 07/09/2019  . Vaccine counseling 07/09/2019  . Dizziness 05/24/2019  . Ataxia 05/24/2019  . Palpitation 05/24/2019  . Essential hypertension, benign 05/24/2019  . History of stroke 05/24/2019  . Benign prostatic hyperplasia with urinary frequency 05/24/2019  . Erectile dysfunction 05/24/2019  . Non-recurrent acute serous otitis media of right ear 05/24/2019    Past Surgical History:  Procedure Laterality Date  . PROSTATE SURGERY    . thumb     right, nerve injury repair       Home Medications      Prior to Admission medications   Medication Sig Start Date End Date Taking? Authorizing Provider  ascorbic acid (VITAMIN C) 1000 MG tablet Take by mouth.    [provider]  aspirin 325 MG tablet Take 1 tablet (325 mg total) by mouth daily. 07/09/19   Tysinger, Kermit Balo, PA-C  lisinopril-hydrochlorothiazide (ZESTORETIC) 10-12.5 MG tablet Take 1 tablet by mouth daily. 07/09/19   Tysinger, Kermit Balo, PA-C  metoprolol tartrate (LOPRESSOR) 25 MG tablet Take 1 tablet (25 mg total) by mouth 2 (two) times daily. Hold if systolic blood pressure (top blood pressure number) less than 100 mmHg or heart rate less than 60 bpm (pulse) 08/01/19 10/30/19  Tolia, Sunit, DO  nitroGLYCERIN (NITROSTAT) 0.4 MG SL tablet Place 1 tablet (0.4 mg total) under the tongue every 5 (five) minutes as needed for chest pain. If you require more than two tablets five minutes apart go to the nearest ER via EMS. 08/01/19 08/31/19  Tolia, Sunit, DO  simvastatin (ZOCOR) 80 MG tablet Take 1 tablet (80 mg total) by mouth daily. 07/09/19   Tysinger, Kermit Balo, PA-C  tamsulosin (FLOMAX) 0.4 MG CAPS capsule Take 1 capsule (0.4 mg total) by mouth daily after supper. 07/09/19   Tysinger, Kermit Balo, PA-C    Family History Family History  Problem Relation Age of Onset  . Hypertension Mother   . Benign prostatic hyperplasia Father   . Other  Father        died after horrible bed sore and demise  . Cancer Sister   . Heart disease Neg Hx   . Stroke Neg Hx     Social History Social History   Tobacco Use  . Smoking status: Never Smoker  . Smokeless tobacco: Never Used  Vaping Use  . Vaping Use: Never used  Substance Use Topics  . Alcohol use: Never  . Drug use: Never     Allergies   Patient has no known allergies.   Review of Systems Review of Systems   Physical Exam Triage Vital Signs ED Triage Vitals  Enc Vitals Group     BP 08/14/19 0904 (!) 129/85     Pulse Rate 08/14/19 0904 65     Resp 08/14/19 0904 16     Temp  08/14/19 0904 98 F (36.7 C)     Temp Source 08/14/19 0904 Oral     SpO2 08/14/19 0904 98 %     Weight 08/14/19 0911 163 lb 2.3 oz (74 kg)     Height 08/14/19 0911 5\' 8"  (1.727 m)     Head Circumference --      Peak Flow --      Pain Score 08/14/19 0911 0     Pain Loc --      Pain Edu? --      Excl. in GC? --    No data found.  Updated Vital Signs BP (!) 129/85 (BP Location: Right Arm)   Pulse 65   Temp 98 F (36.7 C) (Oral)   Resp 16   Ht 5\' 8"  (1.727 m)   Wt 163 lb 2.3 oz (74 kg)   SpO2 98%   BMI 24.81 kg/m   Visual Acuity Right Eye Distance:   Left Eye Distance:   Bilateral Distance:    Right Eye Near:   Left Eye Near:    Bilateral Near:     Physical Exam Vitals and nursing note reviewed.  Constitutional:      General: He is not in acute distress.    Appearance: Normal appearance. He is not ill-appearing, toxic-appearing or diaphoretic.  HENT:     Head: Normocephalic and atraumatic.     Nose: Nose normal. No congestion or rhinorrhea.     Mouth/Throat:     Pharynx: Oropharynx is clear.  Eyes:     Extraocular Movements: Extraocular movements intact.     Conjunctiva/sclera: Conjunctivae normal.     Pupils: Pupils are equal, round, and reactive to light.  Cardiovascular:     Rate and Rhythm: Normal rate and regular rhythm.  Pulmonary:     Effort: Pulmonary effort is normal.     Breath sounds: Normal breath sounds.  Musculoskeletal:        General: Normal range of motion.     Cervical back: Normal range of motion.  Skin:    General: Skin is warm and dry.  Neurological:     General: No focal deficit present.     Mental Status: He is alert and oriented to person, place, and time.     Cranial Nerves: No cranial nerve deficit.     Sensory: No sensory deficit.     Motor: No weakness.     Gait: Gait normal.     Comments: No facial droop, slurred speech or arm drift. Grip strength equal Cranial nerves grossly intact. Gait normal when walking from room to  bathroom.   Psychiatric:  Mood and Affect: Mood normal.        Behavior: Behavior normal.        Thought Content: Thought content normal.        Judgment: Judgment normal.      UC Treatments / Results  Labs (all labs ordered are listed, but only abnormal results are displayed) Labs Reviewed  NOVEL CORONAVIRUS, NAA  POCT URINALYSIS DIP (MANUAL ENTRY)    EKG   Radiology No results found.  Procedures Procedures (including critical care time)  Medications Ordered in UC Medications - No data to display  Initial Impression / Assessment and Plan / UC Course  I have reviewed the triage vital signs and the nursing notes.  Pertinent labs & imaging results that were available during my care of the patient were reviewed by me and considered in my medical decision making (see chart for details).     Dizziness, chills Patient's neurological exam completely normal today. Lungs clear on exam. EKG with sinus bradycardia.  No concern for ACS at this time. Urine without infection. Vital signs normal today Covid swab pending.  This may be some sort of viral illness. Recommend rest, stay hydrated and eat small meals to keep blood sugars up. Patient has appointment to see his cardiologist tomorrow Recommend if symptoms worsen he will need to go to the ER. Patient understanding and agree. Final Clinical Impressions(s) / UC Diagnoses   Final diagnoses:  Dizziness  Chills  Nausea without vomiting     Discharge Instructions     Your EKG was not concerning and your urine was normal Make sure that you are staying hydrated  Eats small meals Rest Covid swab pending. We should have the results in the morning.  Follow up with your doctor tomorrow as planned If the dizziness worsens or you start having other concerning symptoms you will need to go to the ER.     ED Prescriptions    None     PDMP not reviewed this encounter.   Dahlia Byes A, NP 08/15/19 [phone]

## 2019-08-21 ENCOUNTER — Telehealth: Payer: Self-pay

## 2019-08-21 NOTE — Telephone Encounter (Signed)
Called pt to inform him that we would go over his lesix test on his next office visit (08/23/2019)

## 2019-08-21 NOTE — Telephone Encounter (Signed)
-----   Message from [place], [place] sent at 08/19/2019 11:17 PM EDT ----- We will review the results at the upcoming office visit.

## 2019-08-23 ENCOUNTER — Encounter: Payer: Self-pay | Admitting: Cardiology

## 2019-08-23 ENCOUNTER — Ambulatory Visit: Payer: BC Managed Care – PPO | Admitting: Cardiology

## 2019-08-23 ENCOUNTER — Other Ambulatory Visit: Payer: Self-pay

## 2019-08-23 VITALS — BP 111/75 | HR 66 | Ht 68.0 in | Wt 164.0 lb

## 2019-08-23 DIAGNOSIS — R002 Palpitations: Secondary | ICD-10-CM

## 2019-08-23 DIAGNOSIS — Z712 Person consulting for explanation of examination or test findings: Secondary | ICD-10-CM

## 2019-08-23 DIAGNOSIS — I5020 Unspecified systolic (congestive) heart failure: Secondary | ICD-10-CM

## 2019-08-23 DIAGNOSIS — I10 Essential (primary) hypertension: Secondary | ICD-10-CM

## 2019-08-23 DIAGNOSIS — R0609 Other forms of dyspnea: Secondary | ICD-10-CM

## 2019-08-23 DIAGNOSIS — E782 Mixed hyperlipidemia: Secondary | ICD-10-CM

## 2019-08-23 DIAGNOSIS — Z8673 Personal history of transient ischemic attack (TIA), and cerebral infarction without residual deficits: Secondary | ICD-10-CM

## 2019-08-24 ENCOUNTER — Other Ambulatory Visit: Payer: Self-pay

## 2019-08-26 ENCOUNTER — Encounter: Payer: Self-pay | Admitting: Cardiology

## 2019-08-26 MED ORDER — ENTRESTO 49-51 MG PO TABS: 1.0000 | ORAL_TABLET | Freq: Two times a day (BID) | ORAL | 0 refills | Status: DC

## 2019-08-26 NOTE — Progress Notes (Signed)
Date:  08/26/2019   ID:  Adrian Willis, DOB [birth year], MRN [id number]  PCP:  Jac Canavan, PA-C  Cardiologist:  Tessa Lerner, DO, [facility] (established care 06/20/2019)  Date: 08/26/19 Last Office Visit: 08/01/2019  Chief Complaint  Patient presents with  . Follow-up    test results  . Shortness of Breath  . Congestive Heart Failure    HPI  Adrian Willis is a 64 y.o. male who presents to the office with a chief complaint of "follow-up on test results and reevaluation of dyspnea on exertion and heart failure management." Patient's past medical history and cardiovascular risk factors include history of stroke, hypertension, hyperlipidemia, and heart failure with reduced EF, stage B, NYHA class II/III, advanced age.     He was referred to the office at the request of his primary care provider for evaluation of palpitations back in June 2021.  Since then patient has undergone diagnostic work-up which included an echocardiogram, carotid duplex, and a cardiac event monitor.  Cardiac event monitor did not show any significant arrhythmia but at the last office visit he was started on metoprolol tartrate 25 mg twice daily and he has tolerated the medication well.  Patient states that his frequency of palpitations have improved significantly.  At the last office visit he was informed that his LVEF is reduced and symptomatically patient stated that he was also having symptoms of dyspnea on exertion.  In the past he has had effort related dyspnea with mowing his lawn and walking on a flat ground surface which he described as just running out of air.  In addition, given his symptoms of chest heaviness patient was recommended to undergo an ischemic evaluation.  He did not want to undergo coronary CTA and therefore nuclear stress test was recommended.  Since last visit he did have a stress test done which was reported to be overall low risk study.  Details of the report as well as the images were  reviewed with the patient at today's office visit.   Patient states that his effort related dyspnea has improved.  Denies orthopnea, paroxysmal nocturnal dyspnea or lower extremity swelling.  No hospitalizations for congestive heart failure.  History of stroke (2011). Denies prior history of coronary artery disease, myocardial infarction, deep venous thrombosis, pulmonary embolism.  FUNCTIONAL STATUS: Walks a lot at work; however, no exercise program or daily routine.    ALLERGIES: No Known Allergies  MEDICATION LIST PRIOR TO VISIT: Current Meds  Medication Sig  . ascorbic acid (VITAMIN C) 1000 MG tablet Take by mouth.  Marland Kitchen aspirin 325 MG tablet Take 1 tablet (325 mg total) by mouth daily.  . Cholecalciferol (VITAMIN D-3 PO) Take 2,000 Units by mouth daily.  . metoprolol tartrate (LOPRESSOR) 25 MG tablet Take 1 tablet (25 mg total) by mouth 2 (two) times daily. Hold if systolic blood pressure (top blood pressure number) less than 100 mmHg or heart rate less than 60 bpm (pulse)  . nitroGLYCERIN (NITROSTAT) 0.4 MG SL tablet Place 1 tablet (0.4 mg total) under the tongue every 5 (five) minutes as needed for chest pain. If you require more than two tablets five minutes apart go to the nearest ER via EMS.  . simvastatin (ZOCOR) 80 MG tablet Take 1 tablet (80 mg total) by mouth daily.  . tamsulosin (FLOMAX) 0.4 MG CAPS capsule Take 1 capsule (0.4 mg total) by mouth daily after supper.  . [DISCONTINUED] lisinopril-hydrochlorothiazide (ZESTORETIC) 10-12.5 MG tablet Take 1 tablet by mouth daily.  PAST MEDICAL HISTORY: Past Medical History:  Diagnosis Date  . BPH (benign prostatic hyperplasia)   . CVA (cerebral vascular accident) (HCC) 2011  . Hyperlipidemia   . Hypertension     PAST SURGICAL HISTORY: Past Surgical History:  Procedure Laterality Date  . PROSTATE SURGERY    . thumb     right, nerve injury repair    FAMILY HISTORY: The patient family history includes Benign prostatic  hyperplasia in his father; Cancer in his sister; Hypertension in his mother; Other in his father.  SOCIAL HISTORY:  The patient  reports that he has never smoked. He has never used smokeless tobacco. He reports that he does not drink alcohol and does not use drugs.  REVIEW OF SYSTEMS: Review of Systems  Constitutional: Negative for chills and fever.  HENT: Negative for hoarse voice and nosebleeds.   Eyes: Negative for discharge, double vision and pain.  Cardiovascular: Positive for chest pain (improving), dyspnea on exertion (improving) and palpitations (improving). Negative for claudication, leg swelling, near-syncope, orthopnea, paroxysmal nocturnal dyspnea and syncope.  Respiratory: Negative for hemoptysis and shortness of breath.   Musculoskeletal: Negative for muscle cramps and myalgias.  Gastrointestinal: Negative for abdominal pain, constipation, diarrhea, hematemesis, hematochezia, melena, nausea and vomiting.  Neurological: Negative for dizziness and light-headedness.   PHYSICAL EXAM: Vitals with BMI 08/23/2019 08/14/2019 08/01/2019  Height 5\' 8"  5\' 8"  5\' 8"   Weight 164 lbs 163 lbs 2 oz 164 lbs 6 oz  BMI 24.94 24.81 25  Systolic 111 129 [id number]  Diastolic 75 85 87  Pulse 66 65 68   CONSTITUTIONAL: Well-developed and well-nourished. No acute distress.  SKIN: Skin is warm and dry. No rash noted. No cyanosis. No pallor. No jaundice HEAD: Normocephalic and atraumatic.  EYES: No scleral icterus MOUTH/THROAT: Moist oral membranes.  NECK: No JVD present. No thyromegaly noted. No carotid bruits  LYMPHATIC: No visible cervical adenopathy.  CHEST Normal respiratory effort. No intercostal retractions  LUNGS: Clear to auscultation bilaterally.  no stridor. No wheezes. No rales.  CARDIOVASCULAR: Regular rate and rhythm, positive S1-S2, no murmurs rubs or gallops appreciated ABDOMINAL: Soft, nontender, nondistended, positive bowel in all 4 quadrants.  No apparent ascites.  EXTREMITIES: No  peripheral edema  HEMATOLOGIC: No significant bruising NEUROLOGIC: Oriented to person, place, and time. Nonfocal. Normal muscle tone.  PSYCHIATRIC: Normal mood and affect. Normal behavior. Cooperative  CARDIAC DATABASE: EKG: 06/20/2019 normal sinus rhythm, 78 bpm, normal axis, no underlying ischemia or injury pattern.  Echocardiogram: 06/27/2019: Moderate global hypokinesis. LVEF 35-40%. Doppler evidence of grade I (impaired) diastolic dysfunction, normal LAP. Mild TR.   Stress Testing: Lexiscan Sestamibi Stress Test 08/15/2019:  Nondiagnostic ECG stress.  Myocardial perfusion is normal.  There is a fixed mild defect in the inferior region suggestive of diaphragmatic attenuation.  Normal LV size. Overall LV systolic function is abnormal without regional wall motion abnormalities. Stress LV EF: 47%.  No previous exam available for comparison. Low risk.  Heart Catheterization: None  Event monitor 06/27/2019 - 07/11/2019: Diagnostic time: 100%  Dominant rhythm: Normal Sinus. HR 51-154 bpm. Avg HR 80 bpm. No atrial fibrillation, high grade AV block, or sinus pause >3sec noted. Heart rate >100bpm for approximately 9% of the monitoring period.  Heart rate <60bpm for <1% of the monitoring period.  Total ventricular ectopy <1%.  No patient triggered events recorded.   Carotid Duplex 06/27/2019: No hemodynamically significant arterial disease in the internal carotid artery bilaterally. Minimal increased intimal thickness bilaterally. Antegrade right vertebral artery flow. Antegrade left  vertebral artery flow.  LABORATORY DATA: CBC Latest Ref Rng & Units 05/24/2019  WBC 3.4 - 10.8 x10E3/uL 3.9  Hemoglobin 13.0 - 17.7 g/dL 31.5  Hematocrit 40.0 - 51.0 % 45.7  Platelets 150 - 450 x10E3/uL 196    CMP Latest Ref Rng & Units 08/10/2019 05/24/2019  Glucose 65 - 99 mg/dL 90 76  BUN 8 - 27 mg/dL 10 12  Creatinine 8.67 - 1.27 mg/dL 6.19(J) 0.93  Sodium 267 - 144 mmol/L 139 139  Potassium 3.5 -  5.2 mmol/L 4.2 4.6  Chloride 96 - 106 mmol/L 101 99  CO2 20 - 29 mmol/L 26 28  Calcium 8.6 - 10.2 mg/dL 9.3 9.7  Total Protein 6.0 - 8.5 g/dL - 7.8  Total Bilirubin 0.0 - 1.2 mg/dL - 0.5  Alkaline Phos 39 - 117 IU/L - 60  AST 0 - 40 IU/L - 28  ALT 0 - 44 IU/L - 21    Lipid Panel     Component Value Date/Time   CHOL 144 07/09/2019 1300   TRIG 46 07/09/2019 1300   HDL 62 07/09/2019 1300   CHOLHDL 2.3 07/09/2019 1300   LDLCALC 72 07/09/2019 1300   LABVLDL 10 07/09/2019 1300    No results found for: HGBA1C No components found for: NTPROBNP Lab Results  Component Value Date   TSH 0.576 05/24/2019    BMP Recent Labs    05/24/19 1212 08/10/19 1059  NA 139 139  K 4.6 4.2  CL 99 101  CO2 28 26  GLUCOSE 76 90  BUN 12 10  CREATININE 1.10 1.29*  CALCIUM 9.7 9.3  GFRNONAA 70 58*  GFRAA 81 67    CBC No results for input(s): WBC, RBC, HGB, HCT, PLT, MCV, MCH, MCHC, RDW, LYMPHSABS, MONOABS, EOSABS, BASOSABS in the last 168 hours.  Invalid input(s): NEUTRABS  HEMOGLOBIN A1C No results found for: HGBA1C, MPG  Cardiac Panel (last 3 results) No results for input(s): CKTOTAL, CKMB, TROPONINI, RELINDX in the last 8760 hours. No results for input(s): TROPIPOC in the last 8760 hours.  BNP (last 3 results) Recent Labs    08/10/19 1059  PROBNP 35    TSH Recent Labs    05/24/19 1212  TSH 0.576    CHOLESTEROL Recent Labs    07/09/19 1300  CHOL 144    Hepatic Function Panel Recent Labs    05/24/19 1212  PROT 7.8  ALBUMIN 4.5  AST 28  ALT 21  ALKPHOS 60  BILITOT 0.5    IMPRESSION:    ICD-10-CM   1. HFrEF (heart failure with reduced ejection fraction) (HCC)  I50.20 ENTRESTO 49-51 MG    Pro b natriuretic peptide (BNP)    Basic metabolic panel    Magnesium  2. Essential hypertension  I10   3. History of stroke  Z86.73   4. Mixed hyperlipidemia  E78.2   5. Dyspnea on exertion  R06.00   6. Palpitations  R00.2   7. Encounter to discuss test results   Z71.2      RECOMMENDATIONS: Adrian Willis is a 64 y.o. male whose past medical history and cardiac risk factors include:  history of stroke, hypertension, hyperlipidemia, and and heart failure with reduced EF, stage B, NYHA class II/III, advanced age.    Heart failure with reduced EF, stage B, NYHA class II/III:  Labs from August 10, 2019 reviewed with the patient.  Discontinue lisinopril/hydrochlorothiazide.  Patient is educated on the importance of a 36-hour washout.  Prior to initiating Entresto.  For simplicity I have asked him to start Entresto 2 days after he has discontinued lisinopril/hydrochlorothiazide.  We will start Entresto 49/51 mg p.o. twice daily.  Blood work in 1 week to evaluate kidney function electrolytes  Chest heaviness:   Improved since last office visit.  Nuclear stress test did not illustrate reversible ischemia.  Patient has sublingual nitroglycerin to use on as needed basis.  Continue beta-blocker therapy.  May transition to Toprol-XL at the next office visit.    Benign essential hypertension: Medications reviewed.  Currently managed per primary team.  History of stroke: Continue aspirin and statin therapy.   Mixed hyperlipidemia: Continue statin therapy.  Currently managed by primary team.  FINAL MEDICATION LIST END OF ENCOUNTER: Meds ordered this encounter  Medications  . ENTRESTO 49-51 MG    Sig: Take 1 tablet by mouth 2 (two) times daily.    Dispense:  60 tablet    Refill:  0    Medications Discontinued During This Encounter  Medication Reason  . lisinopril-hydrochlorothiazide (ZESTORETIC) 10-12.5 MG tablet Change in therapy     Current Outpatient Medications:  .  ascorbic acid (VITAMIN C) 1000 MG tablet, Take by mouth., Disp: , Rfl:  .  aspirin 325 MG tablet, Take 1 tablet (325 mg total) by mouth daily., Disp: 90 tablet, Rfl: 3 .  Cholecalciferol (VITAMIN D-3 PO), Take 2,000 Units by mouth daily., Disp: , Rfl:  .  metoprolol tartrate  (LOPRESSOR) 25 MG tablet, Take 1 tablet (25 mg total) by mouth 2 (two) times daily. Hold if systolic blood pressure (top blood pressure number) less than 100 mmHg or heart rate less than 60 bpm (pulse), Disp: 180 tablet, Rfl: 0 .  nitroGLYCERIN (NITROSTAT) 0.4 MG SL tablet, Place 1 tablet (0.4 mg total) under the tongue every 5 (five) minutes as needed for chest pain. If you require more than two tablets five minutes apart go to the nearest ER via EMS., Disp: 30 tablet, Rfl: 0 .  simvastatin (ZOCOR) 80 MG tablet, Take 1 tablet (80 mg total) by mouth daily., Disp: 90 tablet, Rfl: 0 .  tamsulosin (FLOMAX) 0.4 MG CAPS capsule, Take 1 capsule (0.4 mg total) by mouth daily after supper., Disp: 90 capsule, Rfl: 0 .  ENTRESTO 49-51 MG, Take 1 tablet by mouth 2 (two) times daily., Disp: 60 tablet, Rfl: 0  Orders Placed This Encounter  Procedures  . Pro b natriuretic peptide (BNP)  . Basic metabolic panel  . Magnesium   --Continue cardiac medications as reconciled in final medication list. --No follow-ups on file. Or sooner if needed. --Continue follow-up with your primary care physician regarding the management of your other chronic comorbid conditions.  Patient's questions and concerns were addressed to his satisfaction. He voices understanding of the instructions provided during this encounter.   This note was created using a voice recognition software as a result there may be grammatical errors inadvertently enclosed that do not reflect the nature of this encounter. Every attempt is made to correct such errors.  Tessa Lerner, [place], [facility]  Pager: [phone] Office: [phone]

## 2019-08-28 ENCOUNTER — Other Ambulatory Visit: Payer: Self-pay | Admitting: Cardiology

## 2019-08-29 LAB — BASIC METABOLIC PANEL
BUN/Creatinine Ratio: 10 (ref 10–24)
BUN: 11 mg/dL (ref 8–27)
CO2: 24 mmol/L (ref 20–29)
Calcium: 9.6 mg/dL (ref 8.6–10.2)
Chloride: 103 mmol/L (ref 96–106)
Creatinine, Ser: 1.07 mg/dL (ref 0.76–1.27)
GFR calc Af Amer: 84 mL/min/{1.73_m2} (ref >59)
GFR calc non Af Amer: 72 mL/min/{1.73_m2} (ref >59)
Glucose: 91 mg/dL (ref 65–99)
Potassium: 5.2 mmol/L (ref 3.5–5.2)
Sodium: 140 mmol/L (ref 134–144)

## 2019-08-29 LAB — MAGNESIUM: Magnesium: 2.1 mg/dL (ref 1.6–2.3)

## 2019-08-29 LAB — PRO B NATRIURETIC PEPTIDE: NT-Pro BNP: 12 pg/mL (ref 0–376)

## 2019-08-29 LAB — SPECIMEN STATUS REPORT

## 2019-08-30 ENCOUNTER — Encounter: Payer: Self-pay | Admitting: Medical

## 2019-09-03 NOTE — Progress Notes (Signed)
Spoke with patient. Patient voiced understanding.

## 2019-09-03 NOTE — Progress Notes (Signed)
Left vm to cb.

## 2019-09-04 ENCOUNTER — Encounter: Payer: Self-pay | Admitting: Medical

## 2019-09-17 ENCOUNTER — Other Ambulatory Visit: Payer: Self-pay | Admitting: Cardiology

## 2019-09-17 DIAGNOSIS — I5020 Unspecified systolic (congestive) heart failure: Secondary | ICD-10-CM

## 2019-09-25 ENCOUNTER — Encounter: Payer: Self-pay | Admitting: Cardiology

## 2019-09-25 ENCOUNTER — Other Ambulatory Visit: Payer: Self-pay

## 2019-09-25 ENCOUNTER — Ambulatory Visit: Payer: BC Managed Care – PPO | Admitting: Cardiology

## 2019-09-25 ENCOUNTER — Telehealth: Payer: Self-pay

## 2019-09-25 VITALS — BP 127/84 | HR 61 | Resp 16 | Ht 68.0 in | Wt 166.0 lb

## 2019-09-25 DIAGNOSIS — E782 Mixed hyperlipidemia: Secondary | ICD-10-CM

## 2019-09-25 DIAGNOSIS — R0609 Other forms of dyspnea: Secondary | ICD-10-CM

## 2019-09-25 DIAGNOSIS — I10 Essential (primary) hypertension: Secondary | ICD-10-CM

## 2019-09-25 DIAGNOSIS — I5020 Unspecified systolic (congestive) heart failure: Secondary | ICD-10-CM

## 2019-09-25 DIAGNOSIS — Z8673 Personal history of transient ischemic attack (TIA), and cerebral infarction without residual deficits: Secondary | ICD-10-CM

## 2019-09-25 MED ORDER — ASPIRIN EC 81 MG PO TBEC: 81.0000 mg | DELAYED_RELEASE_TABLET | Freq: Every day | ORAL | 3 refills | Status: DC

## 2019-09-25 NOTE — Progress Notes (Signed)
Date:  09/25/2019   ID:  Adrian Willis, DOB 11-19-1955, MRN [id number]  PCP:  Jac Canavan, PA-C  Cardiologist:  Tessa Lerner, DO, [facility] (established care 06/20/2019)  Date: 09/25/19 Last Office Visit: 08/23/2019  Chief Complaint  Patient presents with  . HFrEF  . Follow-up    1 month    HPI  Adrian Willis is a 64 y.o. male who presents to the office with a chief complaint of "management of congestive heart failure." Patient's past medical history and cardiovascular risk factors include history of stroke, hypertension, hyperlipidemia, and heart failure with reduced EF, stage B, NYHA class II, advanced age.     He was referred to the office at the request of his primary care provider for evaluation of palpitations back in June 2021.  Since then patient has undergone diagnostic work-up which included an echocardiogram, carotid duplex, and a cardiac event monitor.  Cardiac event monitor did not show any significant arrhythmia but due to underlying sinus tachycardia he was started on Lopressor which he has tolerated well without any side effects or intolerances and his symptoms of palpitation have improved significantly.    During the work-up of palpitations an echocardiogram noted reduced left ventricular systolic function and his symptoms of effort related dyspnea were concerning for underlying heart failure with reduced EF.  Patient is medications have been uptitrated to guideline directed medical therapy and at the last office visit was started on Entresto.  After starting Entresto patient states that he has tolerated the medication well and has felt the best he is ever done in the last several years.  Patient does not want to uptitrate the Entresto at today's office visit since his symptoms have stabilized.    No heart failure admissions since last office visit.    In the past patient has been having symptoms of chest heaviness and given reduced LVEF stress test was recommended.   Stress test was reported to be overall low risk with normal myocardial perfusion; however, independently reviewed the images patient may have a small, medium in size mild intensity perfusion defect in the RCA distribution.  This was conveyed to the patient and recommended further evaluation with either coronary CTA or left heart catheterization.  However, since medical management has improved his symptoms he would like to monitor it medically.    FUNCTIONAL STATUS: Walks a lot at work; however, no exercise program or daily routine.    ALLERGIES: No Known Allergies  MEDICATION LIST PRIOR TO VISIT: Current Meds  Medication Sig  . ascorbic acid (VITAMIN C) 1000 MG tablet Take by mouth.  . Cholecalciferol (VITAMIN D-3 PO) Take 2,000 Units by mouth daily.  Marland Kitchen ENTRESTO 49-51 MG TAKE 1 TABLET BY MOUTH TWICE A DAY  . metoprolol tartrate (LOPRESSOR) 25 MG tablet Take 1 tablet (25 mg total) by mouth 2 (two) times daily. Hold if systolic blood pressure (top blood pressure number) less than 100 mmHg or heart rate less than 60 bpm (pulse)  . simvastatin (ZOCOR) 80 MG tablet Take 1 tablet (80 mg total) by mouth daily.  . tamsulosin (FLOMAX) 0.4 MG CAPS capsule Take 1 capsule (0.4 mg total) by mouth daily after supper.  . [DISCONTINUED] aspirin 325 MG tablet Take 1 tablet (325 mg total) by mouth daily.     PAST MEDICAL HISTORY: Past Medical History:  Diagnosis Date  . BPH (benign prostatic hyperplasia)   . CVA (cerebral vascular accident) (HCC) 2011  . Hyperlipidemia   . Hypertension  PAST SURGICAL HISTORY: Past Surgical History:  Procedure Laterality Date  . PROSTATE SURGERY    . thumb     right, nerve injury repair    FAMILY HISTORY: The patient family history includes Benign prostatic hyperplasia in his father; Cancer in his sister; Hypertension in his mother; Other in his father.  SOCIAL HISTORY:  The patient  reports that he has never smoked. He has never used smokeless tobacco. He  reports that he does not drink alcohol and does not use drugs.  REVIEW OF SYSTEMS: Review of Systems  Constitutional: Negative for chills and fever.  HENT: Negative for hoarse voice and nosebleeds.   Eyes: Negative for discharge, double vision and pain.  Cardiovascular: Positive for palpitations. Negative for chest pain, claudication, dyspnea on exertion, leg swelling, near-syncope, orthopnea, paroxysmal nocturnal dyspnea and syncope.  Respiratory: Negative for hemoptysis and shortness of breath.   Musculoskeletal: Negative for muscle cramps and myalgias.  Gastrointestinal: Negative for abdominal pain, constipation, diarrhea, hematemesis, hematochezia, melena, nausea and vomiting.  Neurological: Negative for dizziness and light-headedness.   PHYSICAL EXAM: Vitals with BMI 09/25/2019 08/23/2019 08/14/2019  Height 5\' 8"  5\' 8"  5\' 8"   Weight 166 lbs 164 lbs 163 lbs 2 oz  BMI 25.25 24.94 24.81  Systolic 127 111  Diastolic 84 75 85  Pulse 61 66 65   CONSTITUTIONAL: Well-developed and well-nourished. No acute distress.  SKIN: Skin is warm and dry. No rash noted. No cyanosis. No pallor. No jaundice HEAD: Normocephalic and atraumatic.  EYES: No scleral icterus MOUTH/THROAT: Moist oral membranes.  NECK: No JVD present. No thyromegaly noted. No carotid bruits  LYMPHATIC: No visible cervical adenopathy.  CHEST Normal respiratory effort. No intercostal retractions  LUNGS: Clear to auscultation bilaterally.  no stridor. No wheezes. No rales.  CARDIOVASCULAR: Regular rate and rhythm, positive S1-S2, no murmurs rubs or gallops appreciated ABDOMINAL: Soft, nontender, nondistended, positive bowel in all 4 quadrants.  No apparent ascites.  EXTREMITIES: No peripheral edema  HEMATOLOGIC: No significant bruising NEUROLOGIC: Oriented to person, place, and time. Nonfocal. Normal muscle tone.  PSYCHIATRIC: Normal mood and affect. Normal behavior. Cooperative  CARDIAC DATABASE: EKG: 06/20/2019 normal  sinus rhythm, 78 bpm, normal axis, no underlying ischemia or injury pattern.  Echocardiogram: 06/27/2019: Moderate global hypokinesis. LVEF 35-40%. Doppler evidence of grade I (impaired) diastolic dysfunction, normal LAP. Mild TR.   Stress Testing: Lexiscan Sestamibi Stress Test 08/15/2019:  Nondiagnostic ECG stress.  Myocardial perfusion is normal.  There is a fixed mild defect in the inferior region suggestive of diaphragmatic attenuation.  Normal LV size. Overall LV systolic function is abnormal without regional wall motion abnormalities. Stress LV EF: 47%.  No previous exam available for comparison. Low risk.  Heart Catheterization: None  Event monitor 06/27/2019 - 07/11/2019: Diagnostic time: 100%  Dominant rhythm: Normal Sinus. HR 51-154 bpm. Avg HR 80 bpm. No atrial fibrillation, high grade AV block, or sinus pause >3sec noted. Heart rate >100bpm for approximately 9% of the monitoring period.  Heart rate <60bpm for <1% of the monitoring period.  Total ventricular ectopy <1%.  No patient triggered events recorded.   Carotid Duplex 06/27/2019: No hemodynamically significant arterial disease in the internal carotid artery bilaterally. Minimal increased intimal thickness bilaterally. Antegrade right vertebral artery flow. Antegrade left vertebral artery flow.  LABORATORY DATA: CBC Latest Ref Rng & Units 05/24/2019  WBC 3.4 - 10.8 x10E3/uL 3.9  Hemoglobin 13.0 - 17.7 g/dL 07/13/2019  Hematocrit 08/27/2019 - 51.0 % 45.7  Platelets 150 - 450 x10E3/uL 196  CMP Latest Ref Rng & Units 08/28/2019 08/10/2019 05/24/2019  Glucose 65 - 99 mg/dL 91 90 76  BUN 8 - 27 mg/dL 11 10 12   Creatinine 0.76 - 1.27 mg/dL 7.40) 8.14(G  Sodium 134 - 144 mmol/L 140 139 139  Potassium 3.5 - 5.2 mmol/L 5.2 4.2 4.6  Chloride 96 - 106 mmol/L 103 101 99  CO2 20 - 29 mmol/L 24 26 28   Calcium 8.6 - 10.2 mg/dL 9.6 9.3 9.7  Total Protein 6.0 - 8.5 g/dL - - 7.8  Total Bilirubin 0.0 - 1.2 mg/dL - - 0.5  Alkaline  Phos 39 - 117 IU/L - - 60  AST 0 - 40 IU/L - - 28  ALT 0 - 44 IU/L - - 21    Lipid Panel     Component Value Date/Time   CHOL 144 07/09/2019 1300   TRIG 46 07/09/2019 1300   HDL 62 07/09/2019 1300   CHOLHDL 2.3 07/09/2019 1300   LDLCALC 72 07/09/2019 1300   LABVLDL 10 07/09/2019 1300    No results found for: HGBA1C No components found for: NTPROBNP Lab Results  Component Value Date   TSH 0.576 05/24/2019    BMP Recent Labs    05/24/19 1212 08/10/19 1059 08/28/19 1103  NA 139 139 140  K 4.6 4.2 5.2  CL 99 101 103  CO2 28 26 24   GLUCOSE 76 90 91  BUN 12 10 11   CREATININE 1.10 1.29* 1.07  CALCIUM 9.7 9.3 9.6  GFRNONAA 70 58* 72  GFRAA 81 67 84    CBC No results for input(s): WBC, RBC, HGB, HCT, PLT, MCV, MCH, MCHC, RDW, LYMPHSABS, MONOABS, EOSABS, BASOSABS in the last 168 hours.  Invalid input(s): NEUTRABS  HEMOGLOBIN A1C No results found for: HGBA1C, MPG  Cardiac Panel (last 3 results) No results for input(s): CKTOTAL, CKMB, TROPONINI, RELINDX in the last 8760 hours. No results for input(s): TROPIPOC in the last 8760 hours.  BNP (last 3 results) Recent Labs    08/10/19 1059 08/28/19 1103  PROBNP 35 12    TSH Recent Labs    05/24/19 1212  TSH 0.576    CHOLESTEROL Recent Labs    07/09/19 1300  CHOL 144    Hepatic Function Panel Recent Labs    05/24/19 1212  PROT 7.8  ALBUMIN 4.5  AST 28  ALT 21  ALKPHOS 60  BILITOT 0.5    IMPRESSION:    ICD-10-CM   1. HFrEF (heart failure with reduced ejection fraction) (HCC)  I50.20   2. Essential hypertension  I10   3. History of stroke  Z86.73 aspirin EC 81 MG tablet  4. Mixed hyperlipidemia  E78.2   5. Dyspnea on exertion  R06.00      RECOMMENDATIONS: Adrian Willis is a 64 y.o. male whose past medical history and cardiac risk factors include:  history of stroke, hypertension, hyperlipidemia, and and heart failure with reduced EF, stage B, NYHA class II/III, advanced age.    Heart  failure with reduced EF, stage B, NYHA class II:  Initiated Entresto August 23, 2019.  Patient states that he feels way better has more energy, and less symptomatic since the initiation of Entresto.  Recommended up titration of Entresto to the next dose the patient wants to hold off on that as his symptoms have improved dramatically.  Patient states that 07/24/19 may be cost prohibitive.  We will try to see if patient qualifies for patient assistance.    Labs from August 28, 2019 independently reviewed with the patient.  Medications reconciled.  Chest heaviness:   No recurrence of chest heaviness since last office visit.  Patient has not required the use of sublingual nitroglycerin tablets.  Stress test was reported to be overall low risk with normal myocardial perfusion; however, independently reviewed the images patient may have a small, medium in size mild intensity perfusion defect in the RCA distribution.  This was conveyed to the patient and recommended further evaluation with either coronary CTA or left heart catheterization.  However, since medical management has improved his symptoms he would like to monitor it medically  Continue to monitor.  Benign essential hypertension: Blood pressure is very well controlled.  Medications reviewed.  Currently managed per primary team.  History of stroke: Continue aspirin and statin therapy.   Mixed hyperlipidemia: Continue statin therapy.  Currently managed by primary team.  FINAL MEDICATION LIST END OF ENCOUNTER: Meds ordered this encounter  Medications  . aspirin EC 81 MG tablet    Sig: Take 1 tablet (81 mg total) by mouth daily. Swallow whole.    Dispense:  90 tablet    Refill:  3    Medications Discontinued During This Encounter  Medication Reason  . aspirin 325 MG tablet Dose change     Current Outpatient Medications:  .  ascorbic acid (VITAMIN C) 1000 MG tablet, Take by mouth., Disp: , Rfl:  .  Cholecalciferol (VITAMIN D-3  PO), Take 2,000 Units by mouth daily., Disp: , Rfl:  .  ENTRESTO 49-51 MG, TAKE 1 TABLET BY MOUTH TWICE A DAY, Disp: 60 tablet, Rfl: 0 .  metoprolol tartrate (LOPRESSOR) 25 MG tablet, Take 1 tablet (25 mg total) by mouth 2 (two) times daily. Hold if systolic blood pressure (top blood pressure number) less than 100 mmHg or heart rate less than 60 bpm (pulse), Disp: 180 tablet, Rfl: 0 .  simvastatin (ZOCOR) 80 MG tablet, Take 1 tablet (80 mg total) by mouth daily., Disp: 90 tablet, Rfl: 0 .  tamsulosin (FLOMAX) 0.4 MG CAPS capsule, Take 1 capsule (0.4 mg total) by mouth daily after supper., Disp: 90 capsule, Rfl: 0 .  aspirin EC 81 MG tablet, Take 1 tablet (81 mg total) by mouth daily. Swallow whole., Disp: 90 tablet, Rfl: 3 .  nitroGLYCERIN (NITROSTAT) 0.4 MG SL tablet, Place 1 tablet (0.4 mg total) under the tongue every 5 (five) minutes as needed for chest pain. If you require more than two tablets five minutes apart go to the nearest ER via EMS., Disp: 30 tablet, Rfl: 0  No orders of the defined types were placed in this encounter.  --Continue cardiac medications as reconciled in final medication list. --Return in about 3 months (around 12/25/2019) for Reevaluation of, heart failure management.. Or sooner if needed. --Continue follow-up with your primary care physician regarding the management of your other chronic comorbid conditions.  Patient's questions and concerns were addressed to his satisfaction. He voices understanding of the instructions provided during this encounter.   This note was created using a voice recognition software as a result there may be grammatical errors inadvertently enclosed that do not reflect the nature of this encounter. Every attempt is made to correct such errors.  Total time spent: 30 minutes.  Tessa LernerSunit Abdinasir Spadafore, [place]DO, First Street HospitalFACC  Pager: [phone] Office: [phone]

## 2019-09-25 NOTE — Telephone Encounter (Signed)
Pt was given Patient assistance application for entresto.

## 2019-10-03 ENCOUNTER — Other Ambulatory Visit: Payer: Self-pay

## 2019-10-03 ENCOUNTER — Other Ambulatory Visit (INDEPENDENT_AMBULATORY_CARE_PROVIDER_SITE_OTHER): Payer: BC Managed Care – PPO

## 2019-10-03 ENCOUNTER — Telehealth: Payer: Self-pay

## 2019-10-03 DIAGNOSIS — Z23 Encounter for immunization: Secondary | ICD-10-CM

## 2019-10-03 NOTE — Telephone Encounter (Signed)
Application for entresto patient assistance was faxed to [employer]

## 2019-10-07 ENCOUNTER — Other Ambulatory Visit: Payer: Self-pay | Admitting: Medical

## 2019-10-12 ENCOUNTER — Other Ambulatory Visit: Payer: Self-pay

## 2019-10-12 ENCOUNTER — Other Ambulatory Visit: Payer: Self-pay | Admitting: Cardiology

## 2019-10-12 DIAGNOSIS — I5020 Unspecified systolic (congestive) heart failure: Secondary | ICD-10-CM

## 2019-10-12 MED ORDER — ENTRESTO 49-51 MG PO TABS: 1.0000 | ORAL_TABLET | Freq: Two times a day (BID) | ORAL | 3 refills | Status: DC

## 2019-10-24 ENCOUNTER — Other Ambulatory Visit: Payer: Self-pay

## 2019-10-30 ENCOUNTER — Other Ambulatory Visit: Payer: Self-pay

## 2019-10-30 DIAGNOSIS — I5020 Unspecified systolic (congestive) heart failure: Secondary | ICD-10-CM

## 2019-10-30 MED ORDER — ENTRESTO 49-51 MG PO TABS: 1.0000 | ORAL_TABLET | Freq: Two times a day (BID) | ORAL | 3 refills | Status: DC

## 2019-11-07 ENCOUNTER — Other Ambulatory Visit: Payer: Self-pay | Admitting: Urology

## 2019-11-23 NOTE — Progress Notes (Signed)
Left voice mail with Adrian Willis pt does not meet [facility] guidelines ef is 35 to 40 %.

## 2019-11-28 NOTE — Progress Notes (Addendum)
PCP - Crosby Oyster PA_C Cardiologist - Tessa Lerner, DO lov 09-25-19 epic  PPM/ICD -  Device Orders -  Rep Notified -   Chest x-ray -  EKG - 07-01-19 epic Stress Test - Lexi scan 07-26-19 epic ECHO - 06-27-19 Cardiac Cath -   Sleep Study -  CPAP -   Fasting Blood Sugar -  Checks Blood Sugar _____ times a day  Blood Thinner Instructions: Aspirin Instructions:  ERAS Protcol - PRE-SURGERY Ensure or G2-   COVID TEST- 11-11  Activity -Can walk 4 flights of stairs without sob  Anesthesia review: CHF, CVA 2011, HTN  Patient denies shortness of breath, fever, cough and chest pain at PAT appointment  NONE   All instructions explained to the patient, with a verbal understanding of the material. Patient agrees to go over the instructions while at home for a better understanding. Patient also instructed to self quarantine after being tested for COVID-19. The opportunity to ask questions was provided.

## 2019-11-28 NOTE — Patient Instructions (Addendum)
DUE TO COVID-19 ONLY ONE VISITOR IS ALLOWED TO COME WITH YOU AND STAY IN THE WAITING ROOM ONLY DURING PRE OP AND PROCEDURE DAY OF SURGERY. THE 1 VISITOR  MAY VISIT WITH YOU AFTER SURGERY IN YOUR PRIVATE ROOM DURING VISITING HOURS ONLY!  YOU NEED TO HAVE A COVID 19 TEST ON__11-11-21_____ @_0900  am______, THIS TEST MUST BE DONE BEFORE SURGERY,  COVID TESTING SITE [address] [place] , IT IS ON THE RIGHT GOING OUT [address] APPROXIMATELY  2 MINUTES PAST [employer] ON THE RIGHT. ONCE YOUR COVID TEST IS COMPLETED,  PLEASE BEGIN THE QUARANTINE INSTRUCTIONS AS OUTLINED IN YOUR HANDOUT.                [id number] [address]  11/28/2019   Your procedure is scheduled on: 12-03-19   Report to [facility] Main  Entrance   Report to admitting at      1225  PM     Call this number if you have problems the morning of surgery [phone]    Remember: Do not eat food  :After Midnight. You may have clear liquids until 1125  Am the morning of your surgery  then nothing by mouth     CLEAR LIQUID DIET   Foods Allowed                                                                               Foods Excluded   Black Coffee and tea, regular and decaf                                      liquids that you cannot  Plain Jell-O any favor except red or purple                                           see through such as: Fruit ices (not with fruit pulp)                                                       milk, soups, orange juice  Iced Popsicles                                                      All solid food Carbonated beverages, regular and diet                                    Cranberry, grape and apple juices Sports drinks like Gatorade Lightly seasoned clear broth or consume(fat free) Sugar, honey syrup  _____________________________________________________________________      BRUSH YOUR TEETH MORNING OF SURGERY  AND RINSE YOUR MOUTH OUT, NO  CHEWING GUM CANDY OR MINTS.     Take these medicines the morning of surgery with A SIP OF WATER: metoprolol, simvastatin                                 You may not have any metal on your body including hair pins and              piercings  Do not wear jewelry,  lotions, powders or perfumes, deodorant              Men may shave face and neck.   Do not bring valuables to the hospital. [facility] IS NOT             RESPONSIBLE   FOR VALUABLES.  Contacts, dentures or bridgework may not be worn into surgery.      Patients discharged the day of surgery will not be allowed to drive home. IF YOU ARE HAVING SURGERY AND GOING HOME THE SAME DAY, YOU MUST HAVE AN ADULT TO DRIVE YOU HOME AND BE WITH YOU FOR 24 HOURS. YOU MAY GO HOME BY TAXI OR UBER OR ORTHERWISE, BUT AN ADULT MUST ACCOMPANY YOU HOME AND STAY WITH YOU FOR 24 HOURS.  Name and phone number of your driver:  Special Instructions: N/A              Please read over the following fact sheets you were given: _____________________________________________________________________             [facility] - Preparing for Surgery Before surgery, you can play an important role.  Because skin is not sterile, your skin needs to be as free of germs as possible.  You can reduce the number of germs on your skin by washing with CHG (chlorahexidine gluconate) soap before surgery.  CHG is an antiseptic cleaner which kills germs and bonds with the skin to continue killing germs even after washing. Please DO NOT use if you have an allergy to CHG or antibacterial soaps.  If your skin becomes reddened/irritated stop using the CHG and inform your nurse when you arrive at Short Stay. Do not shave (including legs and underarms) for at least 48 hours prior to the first CHG shower.  You may shave your face/neck. Please follow these instructions carefully:  1.  Shower with CHG Soap the night before surgery and the  morning of Surgery.  2.  If you choose to  wash your hair, wash your hair first as usual with your  normal  shampoo.  3.  After you shampoo, rinse your hair and body thoroughly to remove the  shampoo.                           4.  Use CHG as you would any other liquid soap.  You can apply chg directly  to the skin and wash                       Gently with a scrungie or clean washcloth.  5.  Apply the CHG Soap to your body ONLY FROM THE NECK DOWN.   Do not use on face/ open                           Wound or open sores.  Avoid contact with eyes, ears mouth and genitals (private parts).                       Wash face,  Genitals (private parts) with your normal soap.             6.  Wash thoroughly, paying special attention to the area where your surgery  will be performed.  7.  Thoroughly rinse your body with warm water from the neck down.  8.  DO NOT shower/wash with your normal soap after using and rinsing off  the CHG Soap.                9.  Pat yourself dry with a clean towel.            10.  Wear clean pajamas.            11.  Place clean sheets on your bed the night of your first shower and do not  sleep with pets. Day of Surgery : Do not apply any lotions/deodorants the morning of surgery.  Please wear clean clothes to the hospital/surgery center.  FAILURE TO FOLLOW THESE INSTRUCTIONS MAY RESULT IN THE CANCELLATION OF YOUR SURGERY PATIENT SIGNATURE_________________________________  NURSE SIGNATURE__________________________________  ________________________________________________________________________

## 2019-11-29 ENCOUNTER — Encounter (HOSPITAL_COMMUNITY): Payer: Self-pay

## 2019-11-29 ENCOUNTER — Other Ambulatory Visit (HOSPITAL_COMMUNITY)
Admission: RE | Admit: 2019-11-29 | Discharge: 2019-11-29 | Disposition: A | Discharge status: Home / Self Care | Payer: BC Managed Care – PPO | Source: Ambulatory Visit | Attending: Urology | Admitting: Urology

## 2019-11-29 ENCOUNTER — Other Ambulatory Visit: Payer: Self-pay

## 2019-11-29 ENCOUNTER — Encounter (HOSPITAL_COMMUNITY)
Admission: RE | Admit: 2019-11-29 | Discharge: 2019-11-29 | Disposition: A | Discharge status: Home / Self Care | Payer: BC Managed Care – PPO | Source: Ambulatory Visit | Attending: Urology | Admitting: Urology

## 2019-11-29 DIAGNOSIS — Z01812 Encounter for preprocedural laboratory examination: Secondary | ICD-10-CM | POA: Insufficient documentation

## 2019-11-29 DIAGNOSIS — Z20822 Contact with and (suspected) exposure to covid-19: Secondary | ICD-10-CM | POA: Diagnosis not present

## 2019-11-29 HISTORY — DX: Heart failure, unspecified: I50.9

## 2019-11-29 HISTORY — DX: Gastro-esophageal reflux disease without esophagitis: K21.9

## 2019-11-29 LAB — CBC
HCT: 44.8 % (ref 39.0–52.0)
Hemoglobin: 14.2 g/dL (ref 13.0–17.0)
MCH: 27.5 pg (ref 26.0–34.0)
MCHC: 31.7 g/dL (ref 30.0–36.0)
MCV: 86.8 fL (ref 80.0–100.0)
Platelets: 179 10*3/uL (ref 150–400)
RBC: 5.16 MIL/uL (ref 4.22–5.81)
RDW: 14 % (ref 11.5–15.5)
WBC: 3.3 10*3/uL — ABNORMAL LOW (ref 4.0–10.5)
nRBC: 0 % (ref 0.0–0.2)

## 2019-11-29 LAB — BASIC METABOLIC PANEL
Anion gap: 6 (ref 5–15)
BUN: 10 mg/dL (ref 8–23)
CO2: 28 mmol/L (ref 22–32)
Calcium: 9 mg/dL (ref 8.9–10.3)
Chloride: 105 mmol/L (ref 98–111)
Creatinine, Ser: 1.09 mg/dL (ref 0.61–1.24)
GFR, Estimated: >60 mL/min (ref >60)
Glucose, Bld: 102 mg/dL — ABNORMAL HIGH (ref 70–99)
Potassium: 5 mmol/L (ref 3.5–5.1)
Sodium: 139 mmol/L (ref 135–145)

## 2019-11-29 LAB — SARS CORONAVIRUS 2 (TAT 6-24 HRS): SARS Coronavirus 2: NEGATIVE

## 2019-11-30 NOTE — Progress Notes (Signed)
Anesthesia Chart Review   Case: [id number] Date/Time: 12/03/19 1410   Procedure: TRANSURETHRAL RESECTION OF THE PROSTATE (TURP), BIPOLAR (N/A ) - ONLY NEEDS 60 MIN   Anesthesia type: General   Pre-op diagnosis: BENIGN PROSTATIC HYPERPLASIA   Location: [facility] ROOM 08 / [facility]   Surgeons: Jannifer Hick, MD      DISCUSSION:64 y.o. never smoker with h/o HTN, CHF, GERD, CVA, BPH scheduled for above procedure 12/03/2019 with Dr. Jettie Pagan.   Pt last seen by cardiology 09/25/2019. Stable at this visit. Per OV note, "Echocardiogram 06/27/2019:  Left ventricle cavity is normal in size and wall thickness. Moderate  global hypokinesis. LVEF 35-40%. Doppler evidence of grade I (impaired)  diastolic dysfunction, normal LAP.  Mild tricuspid regurgitation.  No evidence of pulmonary hypertension." Entresto initiated 08/23/19.   Anticipate pt can proceed with planned procedure barring acute status change.   VS: BP (!) 140/93   Pulse (!) 58   Temp 36.8 C (Oral)   Resp 16   Ht 5\' 8"  (1.727 m)   Wt 74.4 kg   SpO2 99%   BMI 24.94 kg/m   PROVIDERS: Tysinger, , PA-C is PCP   Kermit Balo, DO is Cardiologist  LABS: Labs reviewed: Acceptable for surgery. (all labs ordered are listed, but only abnormal results are displayed)  Labs Reviewed  BASIC METABOLIC PANEL - Abnormal; Notable for the following components:      Result Value   Glucose, Bld 102 (*)    All other components within normal limits  CBC - Abnormal; Notable for the following components:   WBC 3.3 (*)    All other components within normal limits     IMAGES:   EKG: 06/20/2019 normal sinus rhythm, 78 bpm, normal axis, no underlying ischemia or injury pattern.  CV: Echocardiogram 06/27/2019:  Left ventricle cavity is normal in size and wall thickness. Moderate  global hypokinesis. LVEF 35-40%. Doppler evidence of grade I (impaired)  diastolic dysfunction, normal LAP.  Mild tricuspid regurgitation.  No evidence of pulmonary  hypertension.  Lexiscan Sestamibi Stress Test 08/15/2019: Nondiagnostic ECG stress. Myocardial perfusion is normal. There is a fixed mild defect in the inferior region suggestive of diaphragmatic attenuation. Normal LV size. Overall LV systolic function is abnormal without regional wall motion abnormalities. Stress LV EF: 47%.  No previous exam available for comparison. Low risk.  Past Medical History:  Diagnosis Date  . BPH (benign prostatic hyperplasia)   . CHF (congestive heart failure) (HCC)   . CVA (cerebral vascular accident) (HCC) 2011   numbness right index finger and right thumb and right thigh  . GERD (gastroesophageal reflux disease)   . Hyperlipidemia   . Hypertension     Past Surgical History:  Procedure Laterality Date  . CYSTOSCOPY WITH INSERTION OF UROLIFT    . LASIK    . thumb     right, nerve injury repair    MEDICATIONS: . ascorbic acid (VITAMIN C) 1000 MG tablet  . aspirin EC 81 MG tablet  . Cholecalciferol (VITAMIN D-3 PO)  . ENTRESTO 49-51 MG  . metoprolol tartrate (LOPRESSOR) 25 MG tablet  . nitroGLYCERIN (NITROSTAT) 0.4 MG SL tablet  . simvastatin (ZOCOR) 80 MG tablet  . tamsulosin (FLOMAX) 0.4 MG CAPS capsule   No current facility-administered medications for this encounter.   2012, PA-C [facility] Pre-Surgical Testing [phone]

## 2019-12-03 ENCOUNTER — Ambulatory Visit (HOSPITAL_COMMUNITY): Payer: BC Managed Care – PPO | Admitting: Anesthesiology

## 2019-12-03 ENCOUNTER — Other Ambulatory Visit: Payer: Self-pay

## 2019-12-03 ENCOUNTER — Encounter (HOSPITAL_COMMUNITY): Payer: Self-pay | Admitting: Urology

## 2019-12-03 ENCOUNTER — Encounter (HOSPITAL_COMMUNITY)
Admission: RE | Disposition: A | Discharge status: Home / Self Care | Payer: Self-pay | Source: Home / Self Care | Attending: Urology

## 2019-12-03 ENCOUNTER — Ambulatory Visit (HOSPITAL_COMMUNITY): Payer: BC Managed Care – PPO | Admitting: Physician Assistant

## 2019-12-03 ENCOUNTER — Observation Stay (HOSPITAL_COMMUNITY)
Admission: RE | Admit: 2019-12-03 | Discharge: 2019-12-04 | Disposition: A | Discharge status: Home / Self Care | Payer: BC Managed Care – PPO | Attending: Urology | Admitting: Urology

## 2019-12-03 DIAGNOSIS — Z7982 Long term (current) use of aspirin: Secondary | ICD-10-CM | POA: Diagnosis not present

## 2019-12-03 DIAGNOSIS — I110 Hypertensive heart disease with heart failure: Secondary | ICD-10-CM | POA: Insufficient documentation

## 2019-12-03 DIAGNOSIS — I509 Heart failure, unspecified: Secondary | ICD-10-CM | POA: Diagnosis not present

## 2019-12-03 DIAGNOSIS — N400 Enlarged prostate without lower urinary tract symptoms: Secondary | ICD-10-CM | POA: Diagnosis present

## 2019-12-03 DIAGNOSIS — R350 Frequency of micturition: Secondary | ICD-10-CM | POA: Insufficient documentation

## 2019-12-03 DIAGNOSIS — N401 Enlarged prostate with lower urinary tract symptoms: Principal | ICD-10-CM | POA: Insufficient documentation

## 2019-12-03 DIAGNOSIS — R3912 Poor urinary stream: Secondary | ICD-10-CM | POA: Insufficient documentation

## 2019-12-03 DIAGNOSIS — Z79899 Other long term (current) drug therapy: Secondary | ICD-10-CM | POA: Diagnosis not present

## 2019-12-03 HISTORY — PX: TRANSURETHRAL RESECTION OF PROSTATE: SHX73

## 2019-12-03 LAB — CBC
HCT: 41.8 % (ref 39.0–52.0)
Hemoglobin: 13.2 g/dL (ref 13.0–17.0)
MCH: 27.6 pg (ref 26.0–34.0)
MCHC: 31.6 g/dL (ref 30.0–36.0)
MCV: 87.3 fL (ref 80.0–100.0)
Platelets: 171 10*3/uL (ref 150–400)
RBC: 4.79 MIL/uL (ref 4.22–5.81)
RDW: 14.2 % (ref 11.5–15.5)
WBC: 7.4 10*3/uL (ref 4.0–10.5)
nRBC: 0 % (ref 0.0–0.2)

## 2019-12-03 LAB — CREATININE, SERUM
Creatinine, Ser: 0.94 mg/dL (ref 0.61–1.24)
GFR, Estimated: >60 mL/min (ref >60)

## 2019-12-03 SURGERY — TURP (TRANSURETHRAL RESECTION OF PROSTATE)
Anesthesia: General

## 2019-12-03 MED ORDER — DOCUSATE SODIUM 100 MG PO CAPS
100.0000 mg | ORAL_CAPSULE | Freq: Two times a day (BID) | ORAL | Status: DC
  Administered 2019-12-03 – 2019-12-04 (×2): 100 mg via ORAL
  Filled 2019-12-03 (×2): qty 1

## 2019-12-03 MED ORDER — DIPHENHYDRAMINE HCL 50 MG/ML IJ SOLN: 12.5000 mg | Freq: Four times a day (QID) | INTRAMUSCULAR | Status: DC | PRN

## 2019-12-03 MED ORDER — ORAL CARE MOUTH RINSE: 15.0000 mL | Freq: Once | OROMUCOSAL | Status: AC

## 2019-12-03 MED ORDER — DEXAMETHASONE SODIUM PHOSPHATE 10 MG/ML IJ SOLN
INTRAMUSCULAR | Status: DC | PRN
  Administered 2019-12-03: 5 mg via INTRAVENOUS

## 2019-12-03 MED ORDER — OXYCODONE-ACETAMINOPHEN 5-325 MG PO TABS
1.0000 | ORAL_TABLET | ORAL | Status: DC | PRN
  Administered 2019-12-03: 1 via ORAL
  Administered 2019-12-03: 2 via ORAL
  Filled 2019-12-03: qty 1
  Filled 2019-12-03: qty 2

## 2019-12-03 MED ORDER — MIDAZOLAM HCL 5 MG/5ML IJ SOLN
INTRAMUSCULAR | Status: DC | PRN
  Administered 2019-12-03: 2 mg via INTRAVENOUS

## 2019-12-03 MED ORDER — FENTANYL CITRATE (PF) 100 MCG/2ML IJ SOLN
INTRAMUSCULAR | Status: AC
  Filled 2019-12-03: qty 2

## 2019-12-03 MED ORDER — SODIUM CHLORIDE 0.9% FLUSH
3.0000 mL | Freq: Two times a day (BID) | INTRAVENOUS | Status: DC
  Administered 2019-12-03 – 2019-12-04 (×2): 3 mL via INTRAVENOUS

## 2019-12-03 MED ORDER — CHLORHEXIDINE GLUCONATE 0.12 % MT SOLN
15.0000 mL | Freq: Once | OROMUCOSAL | Status: AC
  Administered 2019-12-03: 15 mL via OROMUCOSAL

## 2019-12-03 MED ORDER — DOCUSATE SODIUM 100 MG PO CAPS: 100.0000 mg | ORAL_CAPSULE | Freq: Every day | ORAL | 0 refills | Status: DC | PRN

## 2019-12-03 MED ORDER — DIPHENHYDRAMINE HCL 12.5 MG/5ML PO ELIX: 12.5000 mg | ORAL_SOLUTION | Freq: Four times a day (QID) | ORAL | Status: DC | PRN

## 2019-12-03 MED ORDER — OXYCODONE-ACETAMINOPHEN 5-325 MG PO TABS
1.0000 | ORAL_TABLET | ORAL | 0 refills | Status: DC | PRN
Start: 2019-12-03 — End: 2020-01-23

## 2019-12-03 MED ORDER — CEFAZOLIN SODIUM-DEXTROSE 2-4 GM/100ML-% IV SOLN
2.0000 g | Freq: Once | INTRAVENOUS | Status: AC
  Administered 2019-12-03: 2 g via INTRAVENOUS

## 2019-12-03 MED ORDER — HEPARIN SODIUM (PORCINE) 5000 UNIT/ML IJ SOLN
5000.0000 [IU] | Freq: Three times a day (TID) | INTRAMUSCULAR | Status: DC
  Administered 2019-12-04: 5000 [IU] via SUBCUTANEOUS
  Filled 2019-12-03: qty 1

## 2019-12-03 MED ORDER — OXYCODONE HCL 5 MG PO TABS: 5.0000 mg | ORAL_TABLET | Freq: Once | ORAL | Status: DC | PRN

## 2019-12-03 MED ORDER — SODIUM CHLORIDE 0.9 % IR SOLN
Status: DC | PRN
  Administered 2019-12-03: 27000 mL

## 2019-12-03 MED ORDER — OXYCODONE HCL 5 MG/5ML PO SOLN: 5.0000 mg | Freq: Once | ORAL | Status: DC | PRN

## 2019-12-03 MED ORDER — KCL IN DEXTROSE-NACL 20-5-0.45 MEQ/L-%-% IV SOLN
INTRAVENOUS | Status: DC
  Filled 2019-12-03 (×2): qty 1000

## 2019-12-03 MED ORDER — SODIUM CHLORIDE 0.9% FLUSH: 3.0000 mL | INTRAVENOUS | Status: DC | PRN

## 2019-12-03 MED ORDER — MEPERIDINE HCL 50 MG/ML IJ SOLN
6.2500 mg | INTRAMUSCULAR | Status: DC | PRN
  Administered 2019-12-03: 12.5 mg via INTRAVENOUS

## 2019-12-03 MED ORDER — SODIUM CHLORIDE 0.9 % IR SOLN
3000.0000 mL | Status: DC
  Administered 2019-12-04: 3000 mL

## 2019-12-03 MED ORDER — MIDAZOLAM HCL 2 MG/2ML IJ SOLN
INTRAMUSCULAR | Status: AC
  Filled 2019-12-03: qty 2

## 2019-12-03 MED ORDER — SODIUM CHLORIDE 0.9 % IV SOLN: 250.0000 mL | INTRAVENOUS | Status: DC | PRN

## 2019-12-03 MED ORDER — CEFAZOLIN SODIUM-DEXTROSE 2-4 GM/100ML-% IV SOLN
INTRAVENOUS | Status: AC
  Filled 2019-12-03: qty 100

## 2019-12-03 MED ORDER — BACITRACIN-NEOMYCIN-POLYMYXIN 400-5-5000 EX OINT: 1.0000 "application " | TOPICAL_OINTMENT | Freq: Three times a day (TID) | CUTANEOUS | Status: DC | PRN

## 2019-12-03 MED ORDER — ACETAMINOPHEN 325 MG PO TABS: 650.0000 mg | ORAL_TABLET | ORAL | Status: DC | PRN

## 2019-12-03 MED ORDER — ACETAMINOPHEN 160 MG/5ML PO SOLN: 325.0000 mg | ORAL | Status: DC | PRN

## 2019-12-03 MED ORDER — ONDANSETRON HCL 4 MG/2ML IJ SOLN
INTRAMUSCULAR | Status: DC | PRN
  Administered 2019-12-03: 4 mg via INTRAVENOUS

## 2019-12-03 MED ORDER — LACTATED RINGERS IV SOLN: INTRAVENOUS | Status: DC

## 2019-12-03 MED ORDER — METOPROLOL TARTRATE 25 MG PO TABS
25.0000 mg | ORAL_TABLET | Freq: Two times a day (BID) | ORAL | Status: DC
  Administered 2019-12-03 – 2019-12-04 (×2): 25 mg via ORAL
  Filled 2019-12-03 (×2): qty 1

## 2019-12-03 MED ORDER — FENTANYL CITRATE (PF) 100 MCG/2ML IJ SOLN
25.0000 ug | INTRAMUSCULAR | Status: DC | PRN
  Administered 2019-12-03: 50 ug via INTRAVENOUS

## 2019-12-03 MED ORDER — OXYBUTYNIN CHLORIDE ER 10 MG PO TB24
10.0000 mg | ORAL_TABLET | Freq: Every day | ORAL | Status: DC
  Administered 2019-12-03 – 2019-12-04 (×2): 10 mg via ORAL
  Filled 2019-12-03 (×2): qty 1

## 2019-12-03 MED ORDER — ONDANSETRON HCL 4 MG/2ML IJ SOLN: 4.0000 mg | Freq: Once | INTRAMUSCULAR | Status: DC | PRN

## 2019-12-03 MED ORDER — PROPOFOL 10 MG/ML IV BOLUS
INTRAVENOUS | Status: DC | PRN
  Administered 2019-12-03: 160 mg via INTRAVENOUS

## 2019-12-03 MED ORDER — LIDOCAINE 2% (20 MG/ML) 5 ML SYRINGE
INTRAMUSCULAR | Status: DC | PRN
  Administered 2019-12-03: 60 mg via INTRAVENOUS

## 2019-12-03 MED ORDER — BELLADONNA ALKALOIDS-OPIUM 16.2-60 MG RE SUPP: 1.0000 | Freq: Four times a day (QID) | RECTAL | Status: DC | PRN

## 2019-12-03 MED ORDER — MEPERIDINE HCL 50 MG/ML IJ SOLN
INTRAMUSCULAR | Status: AC
  Filled 2019-12-03: qty 1

## 2019-12-03 MED ORDER — SACUBITRIL-VALSARTAN 49-51 MG PO TABS
1.0000 | ORAL_TABLET | Freq: Two times a day (BID) | ORAL | Status: DC
  Administered 2019-12-03 – 2019-12-04 (×2): 1 via ORAL
  Filled 2019-12-03 (×2): qty 1

## 2019-12-03 MED ORDER — CEPHALEXIN 500 MG PO CAPS: 500.0000 mg | ORAL_CAPSULE | Freq: Four times a day (QID) | ORAL | 0 refills | Status: AC

## 2019-12-03 MED ORDER — ONDANSETRON HCL 4 MG/2ML IJ SOLN: 4.0000 mg | INTRAMUSCULAR | Status: DC | PRN

## 2019-12-03 MED ORDER — ACETAMINOPHEN 325 MG PO TABS: 325.0000 mg | ORAL_TABLET | ORAL | Status: DC | PRN

## 2019-12-03 MED ORDER — MORPHINE SULFATE (PF) 4 MG/ML IV SOLN: 2.0000 mg | INTRAVENOUS | Status: DC | PRN

## 2019-12-03 MED ORDER — FENTANYL CITRATE (PF) 100 MCG/2ML IJ SOLN
INTRAMUSCULAR | Status: DC | PRN
  Administered 2019-12-03: 50 ug via INTRAVENOUS
  Administered 2019-12-03: 25 ug via INTRAVENOUS
  Administered 2019-12-03: 50 ug via INTRAVENOUS
  Administered 2019-12-03: 25 ug via INTRAVENOUS
  Administered 2019-12-03: 50 ug via INTRAVENOUS

## 2019-12-03 SURGICAL SUPPLY — 23 items
BAG URINE DRAIN 2000ML AR STRL (UROLOGICAL SUPPLIES) ×3 IMPLANT
BAG URO CATCHER STRL LF (MISCELLANEOUS) ×3 IMPLANT
CATH FOLEY 3WAY 30CC 24FR (CATHETERS)
CATH HEMA 3WAY 30CC 22FR COUDE (CATHETERS) ×3 IMPLANT
CATH URTH STD 24FR FL 3W 2 (CATHETERS) IMPLANT
ELECT REM PT RETURN 15FT ADLT (MISCELLANEOUS) IMPLANT
GLOVE BIOGEL M STRL SZ7.5 (GLOVE) ×3 IMPLANT
GOWN STRL REUS W/TWL LRG LVL3 (GOWN DISPOSABLE) ×3 IMPLANT
GUIDEWIRE STR DUAL SENSOR (WIRE) IMPLANT
HOLDER FOLEY CATH W/STRAP (MISCELLANEOUS) ×3 IMPLANT
IV CATH 14GX2 1/4 (CATHETERS) IMPLANT
KIT TURNOVER KIT A (KITS) IMPLANT
LOOP CUT BIPOLAR 24F LRG (ELECTROSURGICAL) IMPLANT
MANIFOLD NEPTUNE II (INSTRUMENTS) ×3 IMPLANT
PACK CYSTO (CUSTOM PROCEDURE TRAY) ×3 IMPLANT
SYR 30ML LL (SYRINGE) ×3 IMPLANT
SYR 50ML LL SCALE MARK (SYRINGE) ×3 IMPLANT
SYR TOOMEY IRRIG 70ML (MISCELLANEOUS) ×3
SYRINGE TOOMEY IRRIG 70ML (MISCELLANEOUS) ×1 IMPLANT
TUBING CONNECTING 10 (TUBING) ×2 IMPLANT
TUBING CONNECTING 10' (TUBING) ×1
TUBING UROLOGY SET (TUBING) ×3 IMPLANT
WATER STERILE IRR 500ML POUR (IV SOLUTION) ×3 IMPLANT

## 2019-12-03 NOTE — Op Note (Signed)
Operative Note  Preoperative diagnosis:  1.  BPH with bladder outlet obstruction  Postoperative diagnosis: 1.  BPH with bladder outlet obstruction  Procedure(s): 1.  Bipolar transurethral resection of prostate  Surgeon: Jettie Pagan, MD  Assistants:  None  Anesthesia:  General  Complications:  None  EBL:  <54ml  Specimens: 1. Prostate chips and urolift implants ID Type Source Tests Collected by Time Destination  1 : prostate chips and uro lift implants Tissue PATH Soft tissue resection SURGICAL PATHOLOGY Jannifer Hick, MD 12/03/2019 1455     Drains/Catheters: 1.  22Fr 3 way catheter with 8ml water into balloon  Intraoperative findings:   1. Trilobar obstructing prostate successfully resected. 4 urolift implants in specimen were removed. Excellent hemostasis. Wide open prostatic defect. Ureteral orifices and sphincter were well out of the way.  Indication:  Adrian Willis is a 64 y.o. male with BPH with bladder outlet obstruction presenting for transurethral resection of the prostate. He had a TRUS volume of 13ml.  He has failed urolift.  After thorough discussion including all relevant risk benefits and alternatives, he presents today for a bipolar TURP.  Description of procedure: The indication, alternatives, benefits and risks were discussed with the patient and informed consent was obtained.  Patient was brought to the operating room table, positioned supine, secured with a safety strap.  Pneumatic compression devices were placed on the lower extremities.  After the administration of intravenous antibiotics and general anesthesia, the patient was repositioned into the dorsal lithotomy position.  All pressure points were carefully padded.  A rectal examination was performed confirming a smooth symmetric enlarged gland.  The genitalia were prepped and draped in standard sterile manner.  A timeout was completed, verifying the correct patient, surgical procedure and positioning  prior to beginning the procedure.  Isotonic sodium chloride was used for irrigation.  A 26 French continuous-flow resectoscope sheath with the visual obturator and a 30 degree lens was advanced under direct vision into the bladder.  The anterior urethra appeared normal in its entirety.The prostatic urethra was elongated with trilobarhyperplasia.  On cystoscopic evaluation, his bladder capacity appeared normal, the bladder wall was noted to expand symmetrically in all dimensions.  There were no tumors, stones or foreign bodies present. The bladder was trabeculated with normal-appearing mucosa.  Both ureteral orifices were in their normal anatomic positions with clear urinary reflux noted bilaterally.  The obturator was removed and replaced by the working element with a resection loop.  The location of the ureteral orifices and the prostatic configuration were again confirmed.  Starting at the bladder neck and proceeding distally to the verumontanum a transurethral section of the prostate was performed using bipolar using energy of 4 and 5 for cutting and coagulation, respectively.  The procedure began at the bladder neck at the 5 o'clock and 7 o'clock positions and carefully carried distally to the verumontanum, resecting the intervening prostatic adenoma.  Next the left lateral lobe was resected to the level of the transverse capsular fibers.  The identical procedure was performed on the right lobe.  Attention was then directed anteriorly and the resection was completed from the 10 o'clock to 2 o'clock positions. Of note, during resection, we came across 4 separate urolift implants that were removed and evacuated with the specimen.  All bleeding vessels were fulgurated achieving meticulous hemostasis.  The bladder was irrigated with a Toomey syringe, ensuring removal of all prostate chips which were sent to pathology for evaluation.  Having completed the resection and the  chips removed, we again confirmed  hemostasis with the loop with coagulating current.  Upon completion of the entire procedure, the bladder and posterior urethra were reexamined, confirming open prostatic urethra and bladder neck without evidence of bleeding or perforation.  Both ureteral orifices and the external sphincter were noted to be intact.  The resectoscope was withdrawn under direct vision and a 22 French three-way Foley catheter with a 30 cc balloon was inserted into the bladder.  The balloon was inflated with 50 cc of sterile water.  After multiple manual irrigations ensuring clear return of the irrigant, the procedure was terminated.  The catheter was attached to a drainage bag and continuous bladder irrigation was started with normal saline.  The patient was positioned supine.  At the end of the procedure, all counts were correct.  Patient tolerated the procedure well and was taken to the recovery room satisfactory condition.  Plan: Continuous bladder irrigation overnight with gentle Foley traction.  Plan to discharge home tomorrow with Foley catheter in place and void trial in the office in 3 days.  Matt R. Dayja Loveridge MD [facility]  Pager: [phone]

## 2019-12-03 NOTE — Anesthesia Procedure Notes (Signed)
Procedure Name: LMA Insertion Date/Time: 12/03/2019 2:07 PM Performed by: Vanessa Dunreith, CRNA Pre-anesthesia Checklist: Emergency Drugs available, Patient identified, Suction available and Patient being monitored Patient Re-evaluated:Patient Re-evaluated prior to induction Oxygen Delivery Method: Circle system utilized Preoxygenation: Pre-oxygenation with 100% oxygen Induction Type: IV induction Ventilation: Mask ventilation without difficulty LMA: LMA inserted LMA Size: 4.0 Number of attempts: 1 Placement Confirmation: positive ETCO2 and breath sounds checked- equal and bilateral Tube secured with: Tape Dental Injury: Teeth and Oropharynx as per pre-operative assessment

## 2019-12-03 NOTE — Transfer of Care (Signed)
Immediate Anesthesia Transfer of Care Note  Patient: Adrian Willis  Procedure(s) Performed: TRANSURETHRAL RESECTION OF THE PROSTATE (TURP), BIPOLAR (N/A )  Patient Location: PACU  Anesthesia Type:General  Level of Consciousness: awake and patient cooperative  Airway & Oxygen Therapy: Patient Spontanous Breathing and Patient connected to face mask  Post-op Assessment: Report given to RN and Post -op Vital signs reviewed and stable  Post vital signs: Reviewed and stable  Last Vitals:  Vitals Value Taken Time  BP 133/93 12/03/19 1539  Temp    Pulse 69 12/03/19 1542  Resp 15 12/03/19 1542  SpO2 100 % 12/03/19 1542  Vitals shown include unvalidated device data.  Last Pain:  Vitals:   12/03/19 1302  TempSrc:   PainSc: 0-No pain      Patients Stated Pain Goal: 1 (12/03/19 1302)  Complications: No complications documented.

## 2019-12-03 NOTE — Anesthesia Postprocedure Evaluation (Signed)
Anesthesia Post Note  Patient: Adrian Willis  Procedure(s) Performed: TRANSURETHRAL RESECTION OF THE PROSTATE (TURP), BIPOLAR (N/A )     Patient location during evaluation: PACU Anesthesia Type: General Level of consciousness: awake and alert Pain management: pain level controlled Vital Signs Assessment: post-procedure vital signs reviewed and stable Respiratory status: spontaneous breathing, nonlabored ventilation, respiratory function stable and patient connected to nasal cannula oxygen Cardiovascular status: blood pressure returned to baseline and stable Postop Assessment: no apparent nausea or vomiting Anesthetic complications: no   No complications documented.  Last Vitals:  Vitals:   12/03/19 1615 12/03/19 1630  BP: 121/79 111/78  Pulse: (!) 59 (!) 56  Resp: 15 10  Temp:  36.5 C  SpO2: 100% 99%    Last Pain:  Vitals:   12/03/19 1630  TempSrc:   PainSc: 0-No pain                 Shelton Silvas

## 2019-12-03 NOTE — Anesthesia Preprocedure Evaluation (Addendum)
Anesthesia Evaluation  Patient identified by MRN, date of birth, ID band Patient awake    Reviewed: Allergy & Precautions, H&P , NPO status , Patient's Chart, lab work & pertinent test results, reviewed documented beta blocker date and time   Airway Mallampati: I  TM Distance: >3 FB Neck ROM: full    Dental no notable dental hx. (+) Teeth Intact, Dental Advisory Given   Pulmonary neg pulmonary ROS,    Pulmonary exam normal breath sounds clear to auscultation       Cardiovascular Exercise Tolerance: Good hypertension, Pt. on medications and Pt. on home beta blockers +CHF   Rhythm:regular Rate:Normal  EKG: 06/20/2019 normal sinus rhythm, 78 bpm, normal axis, no underlying ischemia or injury pattern.  CV: Echocardiogram 06/27/2019:  Left ventricle cavity is normal in size and wall thickness. Moderate  global hypokinesis. LVEF 35-40%. Doppler evidence of grade I (impaired)  diastolic dysfunction, normal LAP.  Mild tricuspid regurgitation.  No evidence of pulmonary hypertension.  Lexiscan Sestamibi Stress Test07/28/2021: Nondiagnostic ECG stress. Myocardial perfusion is normal. There is a fixed mild defect in the inferior region suggestive of diaphragmatic attenuation. Normal LV size. Overall LV systolic function is abnormal without regional wall motion abnormalities. Stress LV EF: 47%.  No previous exam available for comparison. Low risk.    Neuro/Psych Carotid artery duplex 06/27/2019: No hemodynamically significant arterial disease in the internal carotid  artery bilaterally. Minimal increased intimal thickness bilaterally.  Antegrade right vertebral artery flow. Antegrade left vertebral artery  flow. CVA, Residual Symptoms negative psych ROS   GI/Hepatic Neg liver ROS, GERD  Medicated,  Endo/Other  negative endocrine ROS  Renal/GU negative Renal ROS  negative genitourinary   Musculoskeletal   Abdominal    Peds  Hematology negative hematology ROS (+)   Anesthesia Other Findings   Reproductive/Obstetrics negative OB ROS                           Anesthesia Physical Anesthesia Plan  ASA: III  Anesthesia Plan: General   Post-op Pain Management:    Induction: Intravenous  PONV Risk Score and Plan: 2 and Ondansetron, Treatment may vary due to age or medical condition and Dexamethasone  Airway Management Planned: LMA  Additional Equipment:   Intra-op Plan:   Post-operative Plan:   Informed Consent: I have reviewed the patients History and Physical, chart, labs and discussed the procedure including the risks, benefits and alternatives for the proposed anesthesia with the patient or authorized representative who has indicated his/her understanding and acceptance.     Dental Advisory Given  Plan Discussed with: CRNA and Anesthesiologist  Anesthesia Plan Comments: ( )        Anesthesia Quick Evaluation

## 2019-12-03 NOTE — Discharge Instructions (Signed)
   Activity:  You are encouraged to ambulate frequently (about every hour during waking hours) to help prevent blood clots from forming in your legs or lungs.  However, you should not engage in any heavy lifting (> 10-15 lbs), strenuous activity, or straining.   Diet: You should advance your diet as instructed by your physician.  It will be normal to have some bloating, nausea, and abdominal discomfort intermittently.   Prescriptions:  You will be provided a prescription for pain medication to take as needed.  If your pain is not severe enough to require the prescription pain medication, you may take extra strength Tylenol instead which will have less side effects.  You should also take a prescribed stool softener to avoid straining with bowel movements as the prescription pain medication may constipate you.   What to call us about: You should call the office [phone]) if you develop fever > 101 or develop persistent vomiting. Activity:  You are encouraged to ambulate frequently (about every hour during waking hours) to help prevent blood clots from forming in your legs or lungs.  However, you should not engage in any heavy lifting (> 10-15 lbs), strenuous activity, or straining.  You have a catheter in place. This will be removed in the office on Thursday.

## 2019-12-03 NOTE — H&P (Signed)
Office Visit Report     10/31/2019   --------------------------------------------------------------------------------   Adrian Willis. Adrian Willis  MRN: [id number]  DOB: 1955/03/20, 64 year old Male  SSN:    PRIMARY CARE:  D Catering manager, PA  REFERRING:  D Kristian Covey, PA  PROVIDER:  Jettie Pagan, M.D.  LOCATION:  [facility] [phone]     --------------------------------------------------------------------------------   CC/HPI: Adrian Willis is a 64 year old male seen in followup today with BPH with LUTS and to undergo BPH evaluation.   TRUS 07/31/2019 revealed prostate volume of 42 mL. He is declining a cystoscopy for uroflow today as he had this performed 3 years ago and this is unpleasant for him.   He has a history of BPH with lower urinary tract symptoms and underwent a UroLift with Dr. Hilda Lias in [facility] on 05/18/2017. He complains of significantly bothersome lower urinary tract symptoms. His IPSS score today is 34. He complains of incomplete emptying almost always. He complains of frequency every 1-2 hours. He complains of intermittency almost every void. He complains of urgency throughout the day. He notes a significantly weak flow of stream. He reports straining to urinate almost every time. He has 4-5 times nocturia nightly. He remains on Flomax. He previously taken finasteride for quite some time however did not see any benefits to stop taking this medication approximately a year ago. He underwent a UroLift 3 years ago and noted return of symptoms approximately 2-1/2 years ago. He states that Flomax is somewhat helpful however is interested in a repeat surgical procedure.   He also complains of erectile dysfunction. He has difficulty obtaining and maintaining erection. His SHIM score is 5.   He denies a personal history of malignancy. He denies a family history of urologic malignancy.   His last PSA was 2.5 on 07/10/2019 which is in the normal range for this  gentleman.   He has seen a cardiologist. He states he has been diagnosed with hypertension but no other heart issues. He denies taking anticoagulation.   Patient currently denies fever, chills, sweats, nausea, vomiting, abdominal or flank pain, gross hematuria or dysuria.          ALLERGIES: NKDA    MEDICATIONS: Metoprolol Succinate 25 mg tablet, extended release 24 hr  Simvastatin 80 mg tablet  Tamsulosin Hcl 0.4 mg capsule  Aspirin Ec 81 mg tablet, delayed release  Entresto 49 mg-51 mg tablet     GU PSH: None   NON-GU PSH: None   GU PMH: BPH w/LUTS - 10/09/2019 Encounter for Prostate Cancer screening - 10/09/2019    NON-GU PMH: GERD Hypercholesterolemia Hypertension    FAMILY HISTORY: 2 daughters - Daughter 1 son - Son   SOCIAL HISTORY: Marital Status: Married Preferred Language: English; Ethnicity: Not Hispanic Or Latino; Race: Black or African American Current Smoking Status: Patient has never smoked.   Tobacco Use Assessment Completed: Used Tobacco in last 30 days? Has never drank.  Does not drink caffeine.    REVIEW OF SYSTEMS:    GU Review Male:   Patient denies frequent urination, hard to postpone urination, burning/ pain with urination, get up at night to urinate, leakage of urine, stream starts and stops, trouble starting your stream, have to strain to urinate , erection problems, and penile pain.  Gastrointestinal (Upper):   Patient denies nausea, vomiting, and indigestion/ heartburn.  Gastrointestinal (Lower):   Patient denies diarrhea and constipation.  Constitutional:   Patient denies fever, night sweats, weight loss, and fatigue.  Skin:  Patient denies itching and skin rash/ lesion.  Eyes:   Patient denies blurred vision and double vision.  Ears/ Nose/ Throat:   Patient denies sore throat and sinus problems.  Hematologic/Lymphatic:   Patient denies swollen glands and easy bruising.  Cardiovascular:   Patient denies leg swelling and chest pains.   Respiratory:   Patient denies cough and shortness of breath.  Endocrine:   Patient denies excessive thirst.  Musculoskeletal:   Patient denies back pain and joint pain.  Neurological:   Patient denies headaches and dizziness.  Psychologic:   Patient denies depression and anxiety.   VITAL SIGNS:      10/31/2019 11:58 AM  Weight 164 lb / 74.39 kg  Height 68 in / 172.72 cm  BP 116/74 mmHg  Pulse 69 /min  Temperature 96.8 F / 36 C  BMI 24.9 kg/m   MULTI-SYSTEM PHYSICAL EXAMINATION:    Constitutional: Well-nourished. No physical deformities. Normally developed. Good grooming.  Respiratory: No labored breathing, no use of accessory muscles.   Cardiovascular: Normal temperature, normal extremity pulses, no swelling, no varicosities.  Gastrointestinal: No mass, no tenderness, no rigidity, non obese abdomen.     Complexity of Data:  Source Of History:  Patient, Medical Record Summary  Lab Test Review:   PSA  Records Review:   AUA Symptom Score, Previous Doctor Records, Previous Patient Records, IIEF Score  Urine Test Review:   Urinalysis  Urodynamics Review:   Review Bladder Scan   PROCEDURES:         Prostate Ultrasound - 56433  Length: 4.57 cm Height: 3.76 cm Width: 4.67 cm Volume: 42.06 ml      The transrectal ultrasound probe is introduced into the rectum, and the prostate is visualized. Ultrasonography is utilized throughout the procedure. At the conclusion of the procedure, the ultrasound probe is removed. The patient tolerates the procedure without complication.  Patient confirmed No Neulasta OnPro Device.          PVR Ultrasound - 29518  Scanned Volume: 000 cc         Urinalysis - 81003 Dipstick Dipstick Cont'd  Color: Yellow Bilirubin: Neg  Appearance: Clear Ketones: Neg  Specific Gravity: 1.020 Blood: Neg  pH: 7.0 Protein: Trace  Glucose: Neg Urobilinogen: 0.2    Nitrites: Neg    Leukocyte Esterase: Neg    Notes:      ASSESSMENT:      ICD-10 Details   1 GU:   BPH w/LUTS - N40.1   2   Encounter for Prostate Cancer screening - Z12.5   3   Urinary Frequency - R35.0   4   Weak Urinary Stream - R39.12    PLAN:           Document Letter(s):  Created for Patient: Clinical Summary         Notes:   1. BPH: S/p urolift on 05/18/2017 in [place]. IPSS score severe at 34. TRUS vol 10/13 is 51ml. Declined cysto/uroflow today. PVR 55ml. Discussed repeat options, he elects to proceed with TURP.  We discussed the options for management of Benign Prostatic Hyperplasia, including watchful waiting, over the counter supplements, medical therapy, minimally invasive therapies, laser therapy and transurethral resection/coagulative therapies. The risks and benefits of each option were discussed.   Risks and benefits of Transurethral resection of the prostate were reviewed in detail including infection, bleeding, blood transfusion, injury to bladder/urethra/surrounding structures, erectile dysfunction, urinary incontinence, bladder neck contracture, persistent obstructive and irritative voiding symptoms, and global anesthesia risks  including but not limited to CVA, MI, DVT, PE, pneumonia, and death. He expressed understanding and desire to proceed.   2. CaP Screening  Last PSA was 2.5 ng/mL on 07/10/2019. This is in the normal range. Check PSA in 1 year. DRE benign last visit.   CC: Kristian CoveyShane Tysinger     Signed by Jettie PaganMatthew Rayyan Burley, M.D. on 11/02/19 at 11:13 AM (EDT)  Urology Preoperative H&P   Chief Complaint: BPH  History of Present Illness: Adrian Willis is a 64 y.o. male with BPH who is s/p urolift with no benefit. He is here today for TURP. Denies fevers or chills.   Past Medical History:  Diagnosis Date  . BPH (benign prostatic hyperplasia)   . CHF (congestive heart failure) (HCC)   . CVA (cerebral vascular accident) (HCC) 2011   numbness right index finger and right thumb and right thigh  . GERD (gastroesophageal reflux disease)   . Hyperlipidemia   .  Hypertension     Past Surgical History:  Procedure Laterality Date  . CYSTOSCOPY WITH INSERTION OF UROLIFT    . LASIK    . thumb     right, nerve injury repair    Allergies: No Known Allergies  Family History  Problem Relation Age of Onset  . Hypertension Mother   . Benign prostatic hyperplasia Father   . Other Father        died after horrible bed sore and demise  . Cancer Sister   . Heart disease Neg Hx   . Stroke Neg Hx     Social History:  reports that he has never smoked. He has never used smokeless tobacco. He reports that he does not drink alcohol and does not use drugs.  ROS: A complete review of systems was performed.  All systems are negative except for pertinent findings as noted.  Physical Exam:  Vital signs in last 24 hours: Temp:  [98.5 F (36.9 C)] 98.5 F (36.9 C) (11/15 1246) Pulse Rate:  [57] 57 (11/15 1246) Resp:  [18] 18 (11/15 1246) BP: (129)/(86) 129/86 (11/15 1246) SpO2:  [100 %] 100 % (11/15 1246) Weight:  [74.6 kg] 74.6 kg (11/15 1302) Constitutional:  Alert and oriented, No acute distress Cardiovascular: Regular rate and rhythm Respiratory: Normal respiratory effort, Lungs clear bilaterally GI: Abdomen is soft, nontender, nondistended, no abdominal masses GU: No CVA tenderness Lymphatic: No lymphadenopathy Neurologic: Grossly intact, no focal deficits Psychiatric: Normal mood and affect  Laboratory Data:  No results for input(s): WBC, HGB, HCT, PLT in the last 72 hours.  No results for input(s): NA, K, CL, GLUCOSE, BUN, CALCIUM, CREATININE in the last 72 hours.  Invalid input(s): CO3   No results found for this or any previous visit (from the past 24 hour(s)). Recent Results (from the past 240 hour(s))  SARS CORONAVIRUS 2 (TAT 6-24 HRS) Nasopharyngeal Nasopharyngeal Swab     Status: None   Collection Time: 11/29/19  9:40 AM   Specimen: Nasopharyngeal Swab  Result Value Ref Range Status   SARS Coronavirus 2 NEGATIVE NEGATIVE Final     Comment: (NOTE) SARS-CoV-2 target nucleic acids are NOT DETECTED.  The SARS-CoV-2 RNA is generally detectable in upper and lower respiratory specimens during the acute phase of infection. Negative results do not preclude SARS-CoV-2 infection, do not rule out co-infections with other pathogens, and should not be used as the sole basis for treatment or other patient management decisions. Negative results must be combined with clinical observations, patient history, and epidemiological information. The  expected result is Negative.  Fact Sheet for Patients: HairSlick.no  Fact Sheet for Healthcare Providers: quierodirigir.com  This test is not yet approved or cleared by the [place] FDA and  has been authorized for detection and/or diagnosis of SARS-CoV-2 by FDA under an Emergency Use Authorization (EUA). This EUA will remain  in effect (meaning this test can be used) for the duration of the COVID-19 declaration under Se ction 564(b)(1) of the Act, 21 U.S.C. section 360bbb-3(b)(1), unless the authorization is terminated or revoked sooner.  Performed at [facility], [address] [place], [place] [address]     Renal Function: Recent Labs    11/29/19 0912  CREATININE 1.09   Estimated Creatinine Clearance: 65.4 mL/min (by C-G formula based on SCr of 1.09 mg/dL).  Radiologic Imaging: No results found.  I independently reviewed the above imaging studies.  Assessment and Plan Adrian Willis is a 64 y.o. male with BPH who is s/p urolift with no benefit. He is here today for TURP.    Risks and benefits of TURP were reviewed in detail including infection, bleeding, blood transfusion, injury to bladder/urethra/surrounding structures, erectile dysfunction, urinary incontinence, bladder neck contracture, persistent obstructive and irritative voiding symptoms, and global anesthesia risks including but not limited to  CVA, MI, DVT, PE, pneumonia, and death.  He expressed understanding and desire to proceed.    Matt R. Marnell Mcdaniel MD 12/03/2019, 1:32 PM  [facility] Pager: [phone]): [phone]

## 2019-12-03 NOTE — Plan of Care (Signed)
  Problem: Pain Managment: Goal: General experience of comfort will improve Outcome: Progressing   

## 2019-12-04 DIAGNOSIS — N401 Enlarged prostate with lower urinary tract symptoms: Secondary | ICD-10-CM | POA: Diagnosis not present

## 2019-12-04 LAB — BASIC METABOLIC PANEL
Anion gap: 9 (ref 5–15)
BUN: 8 mg/dL (ref 8–23)
CO2: 21 mmol/L — ABNORMAL LOW (ref 22–32)
Calcium: 8 mg/dL — ABNORMAL LOW (ref 8.9–10.3)
Chloride: 104 mmol/L (ref 98–111)
Creatinine, Ser: 0.97 mg/dL (ref 0.61–1.24)
GFR, Estimated: >60 mL/min (ref >60)
Glucose, Bld: 195 mg/dL — ABNORMAL HIGH (ref 70–99)
Potassium: 4.6 mmol/L (ref 3.5–5.1)
Sodium: 134 mmol/L — ABNORMAL LOW (ref 135–145)

## 2019-12-04 LAB — SURGICAL PATHOLOGY

## 2019-12-04 LAB — CBC
HCT: 41 % (ref 39.0–52.0)
Hemoglobin: 12.8 g/dL — ABNORMAL LOW (ref 13.0–17.0)
MCH: 27.3 pg (ref 26.0–34.0)
MCHC: 31.2 g/dL (ref 30.0–36.0)
MCV: 87.4 fL (ref 80.0–100.0)
Platelets: 179 10*3/uL (ref 150–400)
RBC: 4.69 MIL/uL (ref 4.22–5.81)
RDW: 14.3 % (ref 11.5–15.5)
WBC: 8.3 10*3/uL (ref 4.0–10.5)
nRBC: 0 % (ref 0.0–0.2)

## 2019-12-04 NOTE — Progress Notes (Signed)
D/c'd with foley in place

## 2019-12-04 NOTE — Discharge Summary (Signed)
Date of admission: 12/03/2019  Date of discharge: 12/04/2019  Admission diagnosis: BPH  Discharge diagnosis: BPH  Secondary diagnoses: none  History and Physical: For full details, please see admission history and physical. Briefly, Adrian Willis is a 64 y.o. year old patient with BPH who underwent TURP on 11/15.   Hospital Course: The patient recovered in the usual expected fashion.  He had his diet advanced slowly.  Initially managed with IV pain control, then transitioned to PO meds when he was tolerating oral intake.  His labs were stable throughout the hospital course. He was on CBI overnight and this remained clear and was capped on POD1. He was discharged to home on POD#1.  At the time of discharge the patient was tolerating a regular diet, passing flatus, ambulating, had adequate pain control and was agreeable to discharge.  Follow up as scheduled.    Laboratory values:  Recent Labs    12/03/19 1610 12/04/19 0540  HGB 13.2 12.8*  HCT 41.8 41.0   Recent Labs    12/03/19 1610 12/04/19 0540  CREATININE 0.94 0.97    Disposition: Home  Discharge instruction: The patient was instructed to be ambulatory but told to refrain from heavy lifting, strenuous activity, or driving.   Discharge medications:  Allergies as of 12/04/2019   No Known Allergies     Medication List    TAKE these medications   ascorbic acid 1000 MG tablet Commonly known as: VITAMIN C Take by mouth.   aspirin EC 81 MG tablet Take 1 tablet (81 mg total) by mouth daily. Swallow whole.   cephALEXin 500 MG capsule Commonly known as: KEFLEX Take 1 capsule (500 mg total) by mouth 4 (four) times daily for 6 doses.   docusate sodium 100 MG capsule Commonly known as: Colace Take 1 capsule (100 mg total) by mouth daily as needed for up to 30 doses.   Entresto 49-51 MG Generic drug: sacubitril-valsartan Take 1 tablet by mouth 2 (two) times daily.   metoprolol tartrate 25 MG tablet Commonly known  as: LOPRESSOR TAKE AS INSTRUCTED BY YOUR PRESCRIBER What changed: See the new instructions.   nitroGLYCERIN 0.4 MG SL tablet Commonly known as: Nitrostat Place 1 tablet (0.4 mg total) under the tongue every 5 (five) minutes as needed for chest pain. If you require more than two tablets five minutes apart go to the nearest ER via EMS.   oxyCODONE-acetaminophen 5-325 MG tablet Commonly known as: Percocet Take 1 tablet by mouth every 4 (four) hours as needed for up to 12 doses for severe pain.   simvastatin 80 MG tablet Commonly known as: ZOCOR TAKE 1 TABLET DAILY   tamsulosin 0.4 MG Caps capsule Commonly known as: FLOMAX TAKE 1 CAPSULE DAILY AFTER SUPPER What changed: See the new instructions.   VITAMIN D-3 PO Take 2,000 Units by mouth daily.       Followup:   Follow-up Information    [facility] In 3 days.   Why: 11:15AM for catheter removal Contact information: [address] [place] [address] [phone]              Matt R. Angad Nabers MD [facility]  Pager: [phone]

## 2019-12-04 NOTE — Plan of Care (Signed)
  Problem: Clinical Measurements: Goal: Will remain free from infection Outcome: Progressing   Problem: Pain Managment: Goal: General experience of comfort will improve Outcome: Progressing   Problem: Safety: Goal: Ability to remain free from injury will improve Outcome: Progressing   

## 2019-12-06 ENCOUNTER — Encounter (HOSPITAL_COMMUNITY): Payer: Self-pay | Admitting: Urology

## 2019-12-25 ENCOUNTER — Encounter: Payer: Self-pay | Admitting: Cardiology

## 2019-12-25 ENCOUNTER — Other Ambulatory Visit: Payer: Self-pay

## 2019-12-25 ENCOUNTER — Ambulatory Visit: Payer: BC Managed Care – PPO | Admitting: Cardiology

## 2019-12-25 VITALS — BP 126/84 | HR 68 | Ht 68.0 in | Wt 166.0 lb

## 2019-12-25 DIAGNOSIS — I5020 Unspecified systolic (congestive) heart failure: Secondary | ICD-10-CM

## 2019-12-25 DIAGNOSIS — I10 Essential (primary) hypertension: Secondary | ICD-10-CM

## 2019-12-25 DIAGNOSIS — Z8673 Personal history of transient ischemic attack (TIA), and cerebral infarction without residual deficits: Secondary | ICD-10-CM

## 2019-12-25 DIAGNOSIS — E782 Mixed hyperlipidemia: Secondary | ICD-10-CM

## 2019-12-25 MED ORDER — ENTRESTO 97-103 MG PO TABS: 1.0000 | ORAL_TABLET | Freq: Two times a day (BID) | ORAL | 0 refills | Status: DC

## 2019-12-25 NOTE — Progress Notes (Signed)
Date:  12/25/2019   ID:  Adrian Willis, DOB 1955/06/13, MRN [id number]  PCP:  Jac Canavanysinger, David S, PA-C  Cardiologist:  Tessa LernerSunit Lavina Resor, DO, [facility] (established care 06/20/2019)  Date: 12/25/19 Last Office Visit: 09/25/2019  Chief Complaint  Patient presents with  . Heart Failure    HPI  Adrian Willis is a 64 y.o. male who presents to the office with a chief complaint of "management of congestive heart failure." Patient's past medical history and cardiovascular risk factors include history of stroke, hypertension, hyperlipidemia, and heart failure with reduced EF, stage B, NYHA class II, advanced age.     He was referred to the office at the request of his primary care provider for evaluation of palpitations back in June 2021.  Since then patient has undergone diagnostic work-up which included an echocardiogram, carotid duplex, and a cardiac event monitor.  Cardiac event monitor did not show any significant arrhythmia but due to underlying sinus tachycardia he was started on Lopressor which he has tolerated well and his symptoms of palpitation have resolved.  His ischemic evaluation included reduced LVEF and given his symptoms of effort related dyspnea patient is being treated for heart failure with reduced EF.  Patient's medications have been uptitrated as stepwise fashion and he presents for follow-up.  Since last office visit patient states that he is doing well from a cardiovascular standpoint.  No hospitalization or urgent care visits for cardiovascular symptoms.  He recently had prostate surgery.  In the past patient also underwent a nuclear stress test which was reported to be a low risk study. However, review of the SPECT images notes a small, medium in size mild intensity perfusion defect in the RCA distribution.  This was conveyed to the patient and recommended further evaluation with either coronary CTA or left heart catheterization.  However, he would like to continue medical therapy for  now and his symptoms have stabilized.    FUNCTIONAL STATUS: Walks a lot at work; however, no exercise program or daily routine.    ALLERGIES: No Known Allergies  MEDICATION LIST PRIOR TO VISIT: Current Meds  Medication Sig  . metoprolol tartrate (LOPRESSOR) 25 MG tablet TAKE AS INSTRUCTED BY YOUR PRESCRIBER (Patient taking differently: Take 25 mg by mouth 2 (two) times daily. )  . nitroGLYCERIN (NITROSTAT) 0.4 MG SL tablet Place 1 tablet (0.4 mg total) under the tongue every 5 (five) minutes as needed for chest pain. If you require more than two tablets five minutes apart go to the nearest ER via EMS.  Marland Kitchen. oxyCODONE-acetaminophen (PERCOCET) 5-325 MG tablet Take 1 tablet by mouth every 4 (four) hours as needed for up to 12 doses for severe pain.  . simvastatin (ZOCOR) 80 MG tablet TAKE 1 TABLET DAILY (Patient taking differently: Take 80 mg by mouth daily. )  . [DISCONTINUED] ENTRESTO 49-51 MG Take 1 tablet by mouth 2 (two) times daily.     PAST MEDICAL HISTORY: Past Medical History:  Diagnosis Date  . BPH (benign prostatic hyperplasia)   . CHF (congestive heart failure) (HCC)   . CVA (cerebral vascular accident) (HCC) 2011   numbness right index finger and right thumb and right thigh  . GERD (gastroesophageal reflux disease)   . Hyperlipidemia   . Hypertension     PAST SURGICAL HISTORY: Past Surgical History:  Procedure Laterality Date  . CYSTOSCOPY WITH INSERTION OF UROLIFT    . LASIK    . thumb     right, nerve injury repair  . TRANSURETHRAL RESECTION  OF PROSTATE N/A 12/03/2019   Procedure: TRANSURETHRAL RESECTION OF THE PROSTATE (TURP), BIPOLAR;  Surgeon: Jannifer Hick, MD;  Location: [facility];  Service: Urology;  Laterality: N/A;  ONLY NEEDS 60 MIN    FAMILY HISTORY: The patient family history includes Benign prostatic hyperplasia in his father; Cancer in his sister; Hypertension in his mother; Other in his father.  SOCIAL HISTORY:  The patient  reports that he has never  smoked. He has never used smokeless tobacco. He reports that he does not drink alcohol and does not use drugs.  REVIEW OF SYSTEMS: Review of Systems  Constitutional: Negative for chills and fever.  HENT: Negative for hoarse voice and nosebleeds.   Eyes: Negative for discharge, double vision and pain.  Cardiovascular: Negative for chest pain, claudication, dyspnea on exertion, leg swelling, near-syncope, orthopnea, palpitations, paroxysmal nocturnal dyspnea and syncope.  Respiratory: Negative for hemoptysis and shortness of breath.   Musculoskeletal: Negative for muscle cramps and myalgias.  Gastrointestinal: Negative for abdominal pain, constipation, diarrhea, hematemesis, hematochezia, melena, nausea and vomiting.  Neurological: Negative for dizziness and light-headedness.   PHYSICAL EXAM: Vitals with BMI 12/25/2019 12/04/2019 12/04/2019  Height  - -  Weight 166 lbs - -  BMI 25.25 - -  Systolic 126 123 098  Diastolic 84 91 73  Pulse 68 62 63   CONSTITUTIONAL: Well-developed and well-nourished. No acute distress.  SKIN: Skin is warm and dry. No rash noted. No cyanosis. No pallor. No jaundice HEAD: Normocephalic and atraumatic.  EYES: No scleral icterus MOUTH/THROAT: Moist oral membranes.  NECK: No JVD present. No thyromegaly noted. No carotid bruits  LYMPHATIC: No visible cervical adenopathy.  CHEST Normal respiratory effort. No intercostal retractions  LUNGS: Clear to auscultation bilaterally.  no stridor. No wheezes. No rales.  CARDIOVASCULAR: Regular rate and rhythm, positive S1-S2, no murmurs rubs or gallops appreciated ABDOMINAL: Soft, nontender, nondistended, positive bowel in all 4 quadrants.  No apparent ascites.  EXTREMITIES: No peripheral edema  HEMATOLOGIC: No significant bruising NEUROLOGIC: Oriented to person, place, and time. Nonfocal. Normal muscle tone.  PSYCHIATRIC: Normal mood and affect. Normal behavior. Cooperative  CARDIAC DATABASE: EKG: 12/25/2019:  Sinus  Rhythm, 61bpm, normal axis, without underlying injury or ischemia.   Echocardiogram: 06/27/2019: Moderate global hypokinesis. LVEF 35-40%. Doppler evidence of grade I (impaired) diastolic dysfunction, normal LAP. Mild TR.   Stress Testing: Lexiscan Sestamibi Stress Test 08/15/2019:  Nondiagnostic ECG stress.  Myocardial perfusion is normal.  There is a fixed mild defect in the inferior region suggestive of diaphragmatic attenuation.  Normal LV size. Overall LV systolic function is abnormal without regional wall motion abnormalities. Stress LV EF: 47%.  No previous exam available for comparison. Low risk.  Heart Catheterization: None  Event monitor 06/27/2019 - 07/11/2019: Diagnostic time: 100%  Dominant rhythm: Normal Sinus. HR 51-154 bpm. Avg HR 80 bpm. No atrial fibrillation, high grade AV block, or sinus pause >3sec noted. Heart rate >100bpm for approximately 9% of the monitoring period.  Heart rate <60bpm for <1% of the monitoring period.  Total ventricular ectopy <1%.  No patient triggered events recorded.   Carotid Duplex 06/27/2019: No hemodynamically significant arterial disease in the internal carotid artery bilaterally. Minimal increased intimal thickness bilaterally. Antegrade right vertebral artery flow. Antegrade left vertebral artery flow.  LABORATORY DATA: CBC Latest Ref Rng & Units 12/04/2019 12/03/2019 11/29/2019  WBC 4.0 - 10.5 K/uL 8.3 7.4 3.3(L)  Hemoglobin 13.0 - 17.0 g/dL 12.8(L) 13.2 14.2  Hematocrit 39 - 52 % 41.0 41.8 44.8  Platelets 150 - 400 K/uL 179 171 179    CMP Latest Ref Rng & Units 12/04/2019 12/03/2019 11/29/2019  Glucose 70 - 99 mg/dL 448(J) - 856(D)  BUN 8 - 23 mg/dL 8 - 10  Creatinine 1.49 - 1.24 mg/dL 7.02 6.37 8.58  Sodium 135 - 145 mmol/L 134(L) - 139  Potassium 3.5 - 5.1 mmol/L 4.6 - 5.0  Chloride 98 - 111 mmol/L 104 - 105  CO2 22 - 32 mmol/L 21(L) - 28  Calcium 8.9 - 10.3 mg/dL 8.0(L) - 9.0  Total Protein 6.0 - 8.5 g/dL - -  -  Total Bilirubin 0.0 - 1.2 mg/dL - - -  Alkaline Phos 39 - 117 IU/L - - -  AST 0 - 40 IU/L - - -  ALT 0 - 44 IU/L - - -    Lipid Panel     Component Value Date/Time   CHOL 144 07/09/2019 1300   TRIG 46 07/09/2019 1300   HDL 62 07/09/2019 1300   CHOLHDL 2.3 07/09/2019 1300   LDLCALC 72 07/09/2019 1300   LABVLDL 10 07/09/2019 1300    No results found for: HGBA1C No components found for: NTPROBNP Lab Results  Component Value Date   TSH 0.576 05/24/2019    BMP Recent Labs    05/24/19 1212 05/24/19 1212 08/10/19 1059 08/10/19 1059 08/28/19 1103 08/28/19 1103 11/29/19 0912 12/03/19 1610 12/04/19 0540  NA 139   < > 139   < > 140  --  139  --  134*  K 4.6   < > 4.2   < > 5.2  --  5.0  --  4.6  CL 99   < > 101   < > 103  --  105  --  104  CO2 28   < > 26   < > 24  --  28  --  21*  GLUCOSE 76   < > 90   < > 91  --  102*  --  195*  BUN 12   < > 10   < > 11  --  10  --  8  CREATININE 1.10   < > 1.29*   < > 1.07   < > 1.09 0.94 0.97  CALCIUM 9.7   < > 9.3   < > 9.6  --  9.0  --  8.0*  GFRNONAA 70   < > 58*   < > 72  --  >60 >60 >60  GFRAA 81  --  67  --  84  --   --   --   --    < > = values in this interval not displayed.    CBC No results for input(s): WBC, RBC, HGB, HCT, PLT, MCV, MCH, MCHC, RDW, LYMPHSABS, MONOABS, EOSABS, BASOSABS in the last 168 hours.  Invalid input(s): NEUTRABS  HEMOGLOBIN A1C No results found for: HGBA1C, MPG  Cardiac Panel (last 3 results) No results for input(s): CKTOTAL, CKMB, TROPONINI, RELINDX in the last 8760 hours. No results for input(s): TROPIPOC in the last 8760 hours.  BNP (last 3 results) Recent Labs    08/10/19 1059 08/28/19 1103  PROBNP 35 12    TSH Recent Labs    05/24/19 1212  TSH 0.576    CHOLESTEROL Recent Labs    07/09/19 1300  CHOL 144    Hepatic Function Panel Recent Labs    05/24/19 1212  PROT 7.8  ALBUMIN 4.5  AST 28  ALT 21  ALKPHOS 60  BILITOT 0.5    IMPRESSION:    ICD-10-CM   1.  HFrEF (heart failure with reduced ejection fraction) (HCC)  I50.20 EKG 12-Lead    sacubitril-valsartan (ENTRESTO) 97-103 MG    Basic metabolic panel    Magnesium    Pro b natriuretic peptide (BNP)  2. Essential hypertension  I10   3. History of stroke  Z86.73   4. Mixed hyperlipidemia  E78.2      RECOMMENDATIONS: Adrian Willis is a 64 y.o. male whose past medical history and cardiac risk factors include:  history of stroke, hypertension, hyperlipidemia, and and heart failure with reduced EF, stage B, NYHA class II/III, advanced age.    Heart failure with reduced EF, stage B, NYHA class II:  Since last office visit no hospitalizations for congestive heart failure.  Patient has been enrolled into patient assistance helping him cover the cost of Entresto.  He is willing to uptitrate medical therapy.  Will increase Entresto to 97/103 mg p.o. twice daily repeat blood work in 1 week.  Recommend further up titration of guideline directed medical therapy as hemodynamics and laboratory values allow.  Patient is now open to for further medication titration.   Labs from December 04 2019 independently reviewed with the patient.  Medications reconciled.  In the past patient did undergo an ischemic evaluation which included a nuclear stress test.  The test was reported to be low risk.  However as noted above independent review of the SPECT images notes a small size mild intensity, reversible perfusion defect in the RCA distribution.  Patient would like to be treated medically as he is asymptomatic for now.  He notes that he should seek medical attention if he has worsening heart failure symptoms or anginal chest pain.  Benign essential hypertension: Blood pressure is very well controlled.  Medications reviewed.   History of stroke: Continue aspirin and statin therapy.   Mixed hyperlipidemia: Continue statin therapy.  Currently managed by primary team.  FINAL MEDICATION LIST END OF  ENCOUNTER: Meds ordered this encounter  Medications  . sacubitril-valsartan (ENTRESTO) 97-103 MG    Sig: Take 1 tablet by mouth 2 (two) times daily.    Dispense:  180 tablet    Refill:  0    Medications Discontinued During This Encounter  Medication Reason  . tamsulosin (FLOMAX) 0.4 MG CAPS capsule Completed Course  . ENTRESTO 49-51 MG Dose change     Current Outpatient Medications:  .  metoprolol tartrate (LOPRESSOR) 25 MG tablet, TAKE AS INSTRUCTED BY YOUR PRESCRIBER (Patient taking differently: Take 25 mg by mouth 2 (two) times daily. ), Disp: 180 tablet, Rfl: 3 .  nitroGLYCERIN (NITROSTAT) 0.4 MG SL tablet, Place 1 tablet (0.4 mg total) under the tongue every 5 (five) minutes as needed for chest pain. If you require more than two tablets five minutes apart go to the nearest ER via EMS., Disp: 30 tablet, Rfl: 0 .  oxyCODONE-acetaminophen (PERCOCET) 5-325 MG tablet, Take 1 tablet by mouth every 4 (four) hours as needed for up to 12 doses for severe pain., Disp: 12 tablet, Rfl: 0 .  simvastatin (ZOCOR) 80 MG tablet, TAKE 1 TABLET DAILY (Patient taking differently: Take 80 mg by mouth daily. ), Disp: 90 tablet, Rfl: 0 .  ascorbic acid (VITAMIN C) 1000 MG tablet, Take by mouth. (Patient not taking: Reported on 11/28/2019), Disp: , Rfl:  .  aspirin EC 81 MG tablet, Take 1 tablet (81 mg total) by mouth daily. Swallow whole. (Patient  not taking: Reported on 11/28/2019), Disp: 90 tablet, Rfl: 3 .  Cholecalciferol (VITAMIN D-3 PO), Take 2,000 Units by mouth daily. (Patient not taking: Reported on 11/28/2019), Disp: , Rfl:  .  docusate sodium (COLACE) 100 MG capsule, Take 1 capsule (100 mg total) by mouth daily as needed for up to 30 doses. (Patient not taking: Reported on 12/25/2019), Disp: 30 capsule, Rfl: 0 .  sacubitril-valsartan (ENTRESTO) 97-103 MG, Take 1 tablet by mouth 2 (two) times daily., Disp: 180 tablet, Rfl: 0  Orders Placed This Encounter  Procedures  . Basic metabolic panel  .  Magnesium  . Pro b natriuretic peptide (BNP)  . EKG 12-Lead   --Continue cardiac medications as reconciled in final medication list. --Return in about 4 weeks (around 01/22/2020) for heart failure management.. Or sooner if needed. --Continue follow-up with your primary care physician regarding the management of your other chronic comorbid conditions.  Patient's questions and concerns were addressed to his satisfaction. He voices understanding of the instructions provided during this encounter.   This note was created using a voice recognition software as a result there may be grammatical errors inadvertently enclosed that do not reflect the nature of this encounter. Every attempt is made to correct such errors.  Total time spent: 36 minutes.  Tessa Lerner, [place], [facility]  Pager: [phone] Office: [phone]

## 2020-01-02 LAB — PRO B NATRIURETIC PEPTIDE: NT-Pro BNP: 21 pg/mL (ref 0–376)

## 2020-01-02 LAB — BASIC METABOLIC PANEL
BUN/Creatinine Ratio: 10 (ref 10–24)
BUN: 12 mg/dL (ref 8–27)
CO2: 29 mmol/L (ref 20–29)
Calcium: 9.3 mg/dL (ref 8.6–10.2)
Chloride: 100 mmol/L (ref 96–106)
Creatinine, Ser: 1.15 mg/dL (ref 0.76–1.27)
GFR calc Af Amer: 77 mL/min/{1.73_m2} (ref >59)
GFR calc non Af Amer: 66 mL/min/{1.73_m2} (ref >59)
Glucose: 74 mg/dL (ref 65–99)
Potassium: 4.4 mmol/L (ref 3.5–5.2)
Sodium: 140 mmol/L (ref 134–144)

## 2020-01-02 LAB — MAGNESIUM: Magnesium: 2.1 mg/dL (ref 1.6–2.3)

## 2020-01-07 ENCOUNTER — Other Ambulatory Visit: Payer: Self-pay | Admitting: Medical

## 2020-01-23 ENCOUNTER — Encounter: Payer: Self-pay | Admitting: Cardiology

## 2020-01-23 ENCOUNTER — Ambulatory Visit: Payer: BC Managed Care – PPO | Admitting: Cardiology

## 2020-01-23 ENCOUNTER — Other Ambulatory Visit: Payer: Self-pay

## 2020-01-23 VITALS — BP 122/86 | HR 73 | Ht 68.0 in | Wt 166.0 lb

## 2020-01-23 DIAGNOSIS — I5020 Unspecified systolic (congestive) heart failure: Secondary | ICD-10-CM

## 2020-01-23 DIAGNOSIS — R0609 Other forms of dyspnea: Secondary | ICD-10-CM

## 2020-01-23 DIAGNOSIS — Z8673 Personal history of transient ischemic attack (TIA), and cerebral infarction without residual deficits: Secondary | ICD-10-CM

## 2020-01-23 DIAGNOSIS — I10 Essential (primary) hypertension: Secondary | ICD-10-CM

## 2020-01-23 DIAGNOSIS — R0600 Dyspnea, unspecified: Secondary | ICD-10-CM

## 2020-01-23 DIAGNOSIS — E782 Mixed hyperlipidemia: Secondary | ICD-10-CM

## 2020-01-23 MED ORDER — METOPROLOL SUCCINATE ER 50 MG PO TB24: 50.0000 mg | ORAL_TABLET | Freq: Every day | ORAL | 1 refills | Status: DC

## 2020-01-23 MED ORDER — DAPAGLIFLOZIN PROPANEDIOL 10 MG PO TABS: 10.0000 mg | ORAL_TABLET | Freq: Every day | ORAL | 0 refills | Status: DC

## 2020-01-23 NOTE — Progress Notes (Signed)
Date:  01/23/2020   ID:  Adrian Willis, DOB May 29, 1955, MRN [id number]  PCP:  Jac Canavan, PA-C  Cardiologist:  Tessa Lerner, DO, [facility] (established care 06/20/2019)  Date: 01/23/20 Last Office Visit: 12/25/2019  Chief Complaint  Patient presents with  . heart failure with reduced ejection fraction  . pt c/o severe heartburn    HPI  Adrian Willis is a 65 y.o. male who presents to the office with a chief complaint of "management of congestive heart failure." Patient's past medical history and cardiovascular risk factors include history of stroke, hypertension, hyperlipidemia, and heart failure with reduced EF, stage B, NYHA class II, advanced age.     He was referred to the office by his PCP back in June 2021 for evaluation of palpitations.  He underwent a monitor which noted sinus tachycardia without any significant dysrhythmias.  He was started on beta-blocker therapy and his symptoms resolved.  He also underwent an echocardiogram which noted moderately reduced left ventricular systolic function and given his symptoms of effort related dyspnea underwent an ischemic evaluation.  The stress test was reported to be overall low risk study.  However, independent review of SPECT images concerning for small size, mild intensity, perfusion defect in the RCA distribution.  However, patient stated that the medical therapy had improved his symptoms of effort related dyspnea and palpitations and he wanted treated medically.  At the last office visit he was more receptive to increasing his guideline directed medical therapy as he received patient assistance for the cost of Entresto.  He was uptitrated on Entresto 97/103 mg p.o. twice daily.  Independent review of blood work dating 01/01/2020 notes preserved kidney function and electrolytes.  His NT proBNP at that time was 21.  Today he presents for follow-up visit for management of congestive heart failure.  No hospitalizations or urgent care  visits for cardiovascular symptoms.  Patient's weight remains stable.  Patient is compliant with his heart failure medications.  Patient states that he tries to assume a low-salt diet.  No chest pain or anginal equivalent.  Patient overall appears euvolemic and denies orthopnea, paroxysmal nocturnal dyspnea or lower extremity swelling.  FUNCTIONAL STATUS: Walks a lot at work; however, no exercise program or daily routine.    ALLERGIES: No Known Allergies  MEDICATION LIST PRIOR TO VISIT: Current Meds  Medication Sig  . aspirin EC 81 MG tablet Take 1 tablet (81 mg total) by mouth daily. Swallow whole.  . dapagliflozin propanediol (FARXIGA) 10 MG TABS tablet Take 1 tablet (10 mg total) by mouth daily before breakfast.  . metoprolol succinate (TOPROL-XL) 50 MG 24 hr tablet Take 1 tablet (50 mg total) by mouth daily. Take with or immediately following a meal.  . sacubitril-valsartan (ENTRESTO) 97-103 MG Take 1 tablet by mouth 2 (two) times daily.  . simvastatin (ZOCOR) 80 MG tablet TAKE 1 TABLET DAILY  . [DISCONTINUED] metoprolol tartrate (LOPRESSOR) 25 MG tablet TAKE AS INSTRUCTED BY YOUR PRESCRIBER (Patient taking differently: Take 25 mg by mouth 2 (two) times daily.)     PAST MEDICAL HISTORY: Past Medical History:  Diagnosis Date  . BPH (benign prostatic hyperplasia)   . CHF (congestive heart failure) (HCC)   . CVA (cerebral vascular accident) (HCC) 2011   numbness right index finger and right thumb and right thigh  . GERD (gastroesophageal reflux disease)   . Hyperlipidemia   . Hypertension     PAST SURGICAL HISTORY: Past Surgical History:  Procedure Laterality Date  . CYSTOSCOPY WITH  INSERTION OF UROLIFT    . LASIK    . thumb     right, nerve injury repair  . TRANSURETHRAL RESECTION OF PROSTATE N/A 12/03/2019   Procedure: TRANSURETHRAL RESECTION OF THE PROSTATE (TURP), BIPOLAR;  Surgeon: Jannifer Hick, MD;  Location: [facility];  Service: Urology;  Laterality: N/A;  ONLY NEEDS 60  MIN    FAMILY HISTORY: The patient family history includes Benign prostatic hyperplasia in his father; Cancer in his sister; Hypertension in his mother; Other in his father.  SOCIAL HISTORY:  The patient  reports that he has never smoked. He has never used smokeless tobacco. He reports that he does not drink alcohol and does not use drugs.  REVIEW OF SYSTEMS: Review of Systems  Constitutional: Negative for chills and fever.  HENT: Negative for hoarse voice and nosebleeds.   Eyes: Negative for discharge, double vision and pain.  Cardiovascular: Negative for chest pain, claudication, dyspnea on exertion, leg swelling, near-syncope, orthopnea, palpitations, paroxysmal nocturnal dyspnea and syncope.  Respiratory: Negative for hemoptysis and shortness of breath.   Musculoskeletal: Negative for muscle cramps and myalgias.  Gastrointestinal: Negative for abdominal pain, constipation, diarrhea, hematemesis, hematochezia, melena, nausea and vomiting.  Neurological: Negative for dizziness and light-headedness.   PHYSICAL EXAM: Vitals with BMI 01/23/2020 12/25/2019 12/04/2019  Height 5\' 8"  5\' 8"  -  Weight 166 lbs 166 lbs -  BMI 25.25 25.25 -  Systolic 122 126  Diastolic 86 84 91  Pulse 73 68 62   CONSTITUTIONAL: Well-developed and well-nourished. No acute distress.  SKIN: Skin is warm and dry. No rash noted. No cyanosis. No pallor. No jaundice HEAD: Normocephalic and atraumatic.  EYES: No scleral icterus MOUTH/THROAT: Moist oral membranes.  NECK: No JVD present. No thyromegaly noted. No carotid bruits  LYMPHATIC: No visible cervical adenopathy.  CHEST Normal respiratory effort. No intercostal retractions  LUNGS: Clear to auscultation bilaterally.  no stridor. No wheezes. No rales.  CARDIOVASCULAR: Regular rate and rhythm, positive S1-S2, no murmurs rubs or gallops appreciated ABDOMINAL: Soft, nontender, nondistended, positive bowel in all 4 quadrants.  No apparent ascites.  EXTREMITIES:  No peripheral edema  HEMATOLOGIC: No significant bruising NEUROLOGIC: Oriented to person, place, and time. Nonfocal. Normal muscle tone.  PSYCHIATRIC: Normal mood and affect. Normal behavior. Cooperative  CARDIAC DATABASE: EKG: 01/23/2020: Normal sinus rhythm, 60 bpm, normal axis, without underlying ischemia or injury pattern.  Echocardiogram: 06/27/2019: Moderate global hypokinesis. LVEF 35-40%. Doppler evidence of grade I (impaired) diastolic dysfunction, normal LAP. Mild TR.   Stress Testing: Lexiscan Sestamibi Stress Test 08/15/2019:  Nondiagnostic ECG stress.  Myocardial perfusion is normal.  There is a fixed mild defect in the inferior region suggestive of diaphragmatic attenuation.  Normal LV size. Overall LV systolic function is abnormal without regional wall motion abnormalities. Stress LV EF: 47%.  No previous exam available for comparison. Low risk.  Heart Catheterization: None  Event monitor 06/27/2019 - 07/11/2019: Diagnostic time: 100%  Dominant rhythm: Normal Sinus. HR 51-154 bpm. Avg HR 80 bpm. No atrial fibrillation, high grade AV block, or sinus pause >3sec noted. Heart rate >100bpm for approximately 9% of the monitoring period.  Heart rate <60bpm for <1% of the monitoring period.  Total ventricular ectopy <1%.  No patient triggered events recorded.   Carotid Duplex 06/27/2019: No hemodynamically significant arterial disease in the internal carotid artery bilaterally. Minimal increased intimal thickness bilaterally. Antegrade right vertebral artery flow. Antegrade left vertebral artery flow.  LABORATORY DATA: CBC Latest Ref Rng & Units 12/04/2019 12/03/2019  11/29/2019  WBC 4.0 - 10.5 K/uL 8.3 7.4 3.3(L)  Hemoglobin 13.0 - 17.0 g/dL 12.8(L) 13.2 14.2  Hematocrit 39.0 - 52.0 % 41.0 41.8 44.8  Platelets 150 - 400 K/uL 179 171 179    CMP Latest Ref Rng & Units 01/01/2020 12/04/2019 12/03/2019  Glucose 65 - 99 mg/dL 74 638(V) -  BUN 8 - 27 mg/dL 12 8 -   Creatinine 5.64 - 1.27 mg/dL 3.32 9.51 8.84  Sodium 134 - 144 mmol/L 140 134(L) -  Potassium 3.5 - 5.2 mmol/L 4.4 4.6 -  Chloride 96 - 106 mmol/L 100 104 -  CO2 20 - 29 mmol/L 29 21(L) -  Calcium 8.6 - 10.2 mg/dL 9.3 8.0(L) -  Total Protein 6.0 - 8.5 g/dL - - -  Total Bilirubin 0.0 - 1.2 mg/dL - - -  Alkaline Phos 39 - 117 IU/L - - -  AST 0 - 40 IU/L - - -  ALT 0 - 44 IU/L - - -    Lipid Panel     Component Value Date/Time   CHOL 144 07/09/2019 1300   TRIG 46 07/09/2019 1300   HDL 62 07/09/2019 1300   CHOLHDL 2.3 07/09/2019 1300   LDLCALC 72 07/09/2019 1300   LABVLDL 10 07/09/2019 1300    No results found for: HGBA1C No components found for: NTPROBNP Lab Results  Component Value Date   TSH 0.576 05/24/2019    BMP Recent Labs    08/10/19 1059 08/28/19 1103 08/28/19 1103 11/29/19 0912 12/03/19 1610 12/04/19 0540 01/01/20 1253  NA 139 140  --  139  --  134* 140  K 4.2 5.2  --  5.0  --  4.6 4.4  CL 101 103  --  105  --  104 100  CO2 26 24  --  28  --  21* 29  GLUCOSE 90 91  --  102*  --  195* 74  BUN 10 11  --  10  --  8 12  CREATININE 1.29* 1.07  --  1.09 0.94 0.97 1.15  CALCIUM 9.3 9.6  --  9.0  --  8.0* 9.3  GFRNONAA 58* 72   < > >60 >60 >60 66  GFRAA 67 84  --   --   --   --  77   < > = values in this interval not displayed.    CBC No results for input(s): WBC, RBC, HGB, HCT, PLT, MCV, MCH, MCHC, RDW, LYMPHSABS, MONOABS, EOSABS, BASOSABS in the last 168 hours.  Invalid input(s): NEUTRABS  HEMOGLOBIN A1C No results found for: HGBA1C, MPG  Cardiac Panel (last 3 results) No results for input(s): CKTOTAL, CKMB, TROPONINI, RELINDX in the last 8760 hours. No results for input(s): TROPIPOC in the last 8760 hours.  BNP (last 3 results) Recent Labs    08/10/19 1059 08/28/19 1103 01/01/20 1253  PROBNP 35 12 21    TSH Recent Labs    05/24/19 1212  TSH 0.576    CHOLESTEROL Recent Labs    07/09/19 1300  CHOL 144    Hepatic Function  Panel Recent Labs    05/24/19 1212  PROT 7.8  ALBUMIN 4.5  AST 28  ALT 21  ALKPHOS 60  BILITOT 0.5    IMPRESSION:    ICD-10-CM   1. HFrEF (heart failure with reduced ejection fraction) (HCC)  I50.20 EKG 12-Lead    metoprolol succinate (TOPROL-XL) 50 MG 24 hr tablet    dapagliflozin propanediol (FARXIGA) 10 MG TABS tablet  Basic metabolic panel    Magnesium    Pro b natriuretic peptide (BNP)  2. Dyspnea on exertion  R06.00   3. Essential hypertension  I10   4. Mixed hyperlipidemia  E78.2   5. History of stroke  Z86.73      RECOMMENDATIONS: Adrian Willis is a 65 y.o. male whose past medical history and cardiac risk factors include:  history of stroke, hypertension, hyperlipidemia, and and heart failure with reduced EF, stage B, NYHA class II/III, advanced age.    Heart failure with reduced EF, stage B, NYHA class II:  Since last office visit no hospitalizations for congestive heart failure.  Since last visit patient has tolerated the up titration of Entresto to 97/103 mg p.o. twice daily.  Independently reviewed labs from December 2021.  We will transition him from Lopressor to Toprol-XL 50 mg p.o. daily.  Medication refilled provided  Start Farxiga 10 mg p.o. daily.  Blood work in 1 week to evaluate kidney function and electrolytes.  At the next office visit we will consider the addition of spironolactone if hemodynamics and laboratory values allow.  In the past patient did undergo an ischemic evaluation which included a nuclear stress test.  The test was reported to be low risk.  However as noted above independent review of the SPECT images notes a small size mild intensity, reversible perfusion defect in the RCA distribution.  Patient would like to be treated medically as he is asymptomatic for now.  He notes that he should seek medical attention if he has worsening heart failure symptoms or anginal chest pain.  Benign essential hypertension: Blood pressure is very  well controlled.  Medications reviewed.   History of stroke: Continue aspirin and statin therapy.   Mixed hyperlipidemia: Continue statin therapy.  Currently managed by primary team.  FINAL MEDICATION LIST END OF ENCOUNTER: Meds ordered this encounter  Medications  . metoprolol succinate (TOPROL-XL) 50 MG 24 hr tablet    Sig: Take 1 tablet (50 mg total) by mouth daily. Take with or immediately following a meal.    Dispense:  90 tablet    Refill:  1  . dapagliflozin propanediol (FARXIGA) 10 MG TABS tablet    Sig: Take 1 tablet (10 mg total) by mouth daily before breakfast.    Dispense:  30 tablet    Refill:  0    Medications Discontinued During This Encounter  Medication Reason  . oxyCODONE-acetaminophen (PERCOCET) 5-325 MG tablet Completed Course  . Cholecalciferol (VITAMIN D-3 PO) Completed Course  . docusate sodium (COLACE) 100 MG capsule Completed Course  . metoprolol tartrate (LOPRESSOR) 25 MG tablet Change in therapy     Current Outpatient Medications:  .  aspirin EC 81 MG tablet, Take 1 tablet (81 mg total) by mouth daily. Swallow whole., Disp: 90 tablet, Rfl: 3 .  dapagliflozin propanediol (FARXIGA) 10 MG TABS tablet, Take 1 tablet (10 mg total) by mouth daily before breakfast., Disp: 30 tablet, Rfl: 0 .  metoprolol succinate (TOPROL-XL) 50 MG 24 hr tablet, Take 1 tablet (50 mg total) by mouth daily. Take with or immediately following a meal., Disp: 90 tablet, Rfl: 1 .  sacubitril-valsartan (ENTRESTO) 97-103 MG, Take 1 tablet by mouth 2 (two) times daily., Disp: 180 tablet, Rfl: 0 .  simvastatin (ZOCOR) 80 MG tablet, TAKE 1 TABLET DAILY, Disp: 90 tablet, Rfl: 0 .  ascorbic acid (VITAMIN C) 1000 MG tablet, Take by mouth. (Patient not taking: Reported on 01/23/2020), Disp: , Rfl:  .  nitroGLYCERIN (  NITROSTAT) 0.4 MG SL tablet, Place 1 tablet (0.4 mg total) under the tongue every 5 (five) minutes as needed for chest pain. If you require more than two tablets five minutes apart go  to the nearest ER via EMS., Disp: 30 tablet, Rfl: 0  Orders Placed This Encounter  Procedures  . Basic metabolic panel  . Magnesium  . Pro b natriuretic peptide (BNP)  . EKG 12-Lead   --Continue cardiac medications as reconciled in final medication list. --Return in about 4 weeks (around 02/20/2020) for heart failure management.. Or sooner if needed. --Continue follow-up with your primary care physician regarding the management of your other chronic comorbid conditions.  Patient's questions and concerns were addressed to his satisfaction. He voices understanding of the instructions provided during this encounter.   This note was created using a voice recognition software as a result there may be grammatical errors inadvertently enclosed that do not reflect the nature of this encounter. Every attempt is made to correct such errors.  Tessa Lerner, [place], [facility]  Pager: [phone] Office: [phone]

## 2020-01-30 ENCOUNTER — Other Ambulatory Visit: Payer: Self-pay | Admitting: Cardiology

## 2020-01-31 LAB — BASIC METABOLIC PANEL
BUN/Creatinine Ratio: 9 — ABNORMAL LOW (ref 10–24)
BUN: 9 mg/dL (ref 8–27)
CO2: 25 mmol/L (ref 20–29)
Calcium: 9.3 mg/dL (ref 8.6–10.2)
Chloride: 102 mmol/L (ref 96–106)
Creatinine, Ser: 1.02 mg/dL (ref 0.76–1.27)
GFR calc Af Amer: 89 mL/min/{1.73_m2} (ref >59)
GFR calc non Af Amer: 77 mL/min/{1.73_m2} (ref >59)
Glucose: 88 mg/dL (ref 65–99)
Potassium: 4.4 mmol/L (ref 3.5–5.2)
Sodium: 141 mmol/L (ref 134–144)

## 2020-01-31 LAB — MAGNESIUM: Magnesium: 2.2 mg/dL (ref 1.6–2.3)

## 2020-01-31 LAB — PRO B NATRIURETIC PEPTIDE: NT-Pro BNP: 20 pg/mL (ref 0–376)

## 2020-02-06 NOTE — Progress Notes (Signed)
Attempted to call pt, no answer. Left vm requesting call back.

## 2020-02-08 NOTE — Progress Notes (Signed)
Called and spoke with pt regarding BMP, patient voiced understanding.

## 2020-02-18 ENCOUNTER — Other Ambulatory Visit: Payer: Self-pay

## 2020-02-18 DIAGNOSIS — I5020 Unspecified systolic (congestive) heart failure: Secondary | ICD-10-CM

## 2020-02-21 ENCOUNTER — Ambulatory Visit: Payer: BC Managed Care – PPO | Admitting: Cardiology

## 2020-02-21 ENCOUNTER — Other Ambulatory Visit: Payer: Self-pay

## 2020-02-21 ENCOUNTER — Encounter: Payer: Self-pay | Admitting: Cardiology

## 2020-02-21 VITALS — BP 134/85 | HR 86 | Temp 98.0°F | Resp 16 | Ht 68.0 in | Wt 162.4 lb

## 2020-02-21 DIAGNOSIS — R0600 Dyspnea, unspecified: Secondary | ICD-10-CM

## 2020-02-21 DIAGNOSIS — E782 Mixed hyperlipidemia: Secondary | ICD-10-CM

## 2020-02-21 DIAGNOSIS — Z8673 Personal history of transient ischemic attack (TIA), and cerebral infarction without residual deficits: Secondary | ICD-10-CM

## 2020-02-21 DIAGNOSIS — R0609 Other forms of dyspnea: Secondary | ICD-10-CM

## 2020-02-21 DIAGNOSIS — I5020 Unspecified systolic (congestive) heart failure: Secondary | ICD-10-CM

## 2020-02-21 DIAGNOSIS — R072 Precordial pain: Secondary | ICD-10-CM

## 2020-02-21 DIAGNOSIS — I10 Essential (primary) hypertension: Secondary | ICD-10-CM

## 2020-02-21 MED ORDER — DAPAGLIFLOZIN PROPANEDIOL 10 MG PO TABS: 10.0000 mg | ORAL_TABLET | Freq: Every day | ORAL | 1 refills | Status: DC

## 2020-02-21 NOTE — Progress Notes (Signed)
Date:  02/21/2020   ID:  Adrian LarsenFredrick Hendershott, DOB 07-Oct-1955, MRN [id number]  PCP:  Jac Canavanysinger, David S, PA-C  Cardiologist:  Tessa LernerSunit Grizel Vesely, DO, [facility] (established care 06/20/2019)  Date: 02/21/20 Last Office Visit: 12/25/2019  Chief Complaint  Patient presents with  . Congestive Heart Failure  . Chest Pain  . Follow-up    4 weeks    HPI  Adrian Willis is a 65 y.o. male who presents to the office with a chief complaint of "management of congestive heart failure." Patient's past medical history and cardiovascular risk factors include history of stroke, hypertension, hyperlipidemia, and heart failure with reduced EF, stage B, NYHA class II, advanced age.     He was referred to the office by his PCP back in June 2021 for evaluation of palpitations.  He underwent a monitor which noted sinus tachycardia without any significant dysrhythmias.  He was started on beta-blocker therapy and his symptoms resolved.  He also underwent an echocardiogram which noted moderately reduced left ventricular systolic function and given his symptoms of effort related dyspnea underwent an ischemic evaluation.  The stress test was reported to be overall low risk study.  However, independent review of SPECT images concerning for small size, mild intensity, perfusion defect in the RCA distribution.  However, patient stated that the medical therapy had improved his symptoms of effort related dyspnea and palpitations and he wanted treated medically.  At the last office visit he was more receptive to increasing his guideline directed medical therapy as he received patient assistance for the cost of Entresto.  He was uptitrated on Entresto 97/103 mg p.o. twice daily.  Independent review of blood work dating 01/01/2020 notes preserved kidney function and electrolytes.  His NT proBNP at that time was 21.  Today he presents for follow-up visit for management of congestive heart failure.  No hospitalizations or urgent care visits for  cardiovascular symptoms.  Patient is compliant with his heart failure medications.  He has lost 4 pounds since last office visit.  Patient states that he tries to assume a low-salt diet.    Last Saturday patient states that he started boxing and after that has been experiencing chest discomfort.  The pain has been going on for last 5 days, intermittent, last for several hours on duration (4-18hrs), not brought on by effort related activities, does not resolve with rest.  It is more present when he is laying flat.  Patient has not required sublingual nitroglycerin tablets.  Pain is reproducible on palpation.  FUNCTIONAL STATUS: Walks a lot at work; however, no exercise program or daily routine.    ALLERGIES: No Known Allergies  MEDICATION LIST PRIOR TO VISIT: Current Meds  Medication Sig  . ascorbic acid (VITAMIN C) 1000 MG tablet Take by mouth.  Marland Kitchen. aspirin EC 81 MG tablet Take 1 tablet (81 mg total) by mouth daily. Swallow whole.  . metoprolol succinate (TOPROL-XL) 50 MG 24 hr tablet Take 1 tablet (50 mg total) by mouth daily. Take with or immediately following a meal.  . nitroGLYCERIN (NITROSTAT) 0.4 MG SL tablet Place 1 tablet (0.4 mg total) under the tongue every 5 (five) minutes as needed for chest pain. If you require more than two tablets five minutes apart go to the nearest ER via EMS.  . sacubitril-valsartan (ENTRESTO) 97-103 MG Take 1 tablet by mouth 2 (two) times daily.  . simvastatin (ZOCOR) 80 MG tablet TAKE 1 TABLET DAILY  . [DISCONTINUED] dapagliflozin propanediol (FARXIGA) 10 MG TABS tablet Take 1  tablet (10 mg total) by mouth daily before breakfast.     PAST MEDICAL HISTORY: Past Medical History:  Diagnosis Date  . BPH (benign prostatic hyperplasia)   . CHF (congestive heart failure) (HCC)   . CVA (cerebral vascular accident) (HCC) 2011   numbness right index finger and right thumb and right thigh  . GERD (gastroesophageal reflux disease)   . Hyperlipidemia   .  Hypertension     PAST SURGICAL HISTORY: Past Surgical History:  Procedure Laterality Date  . CYSTOSCOPY WITH INSERTION OF UROLIFT    . LASIK    . thumb     right, nerve injury repair  . TRANSURETHRAL RESECTION OF PROSTATE N/A 12/03/2019   Procedure: TRANSURETHRAL RESECTION OF THE PROSTATE (TURP), BIPOLAR;  Surgeon: Jannifer Hick, MD;  Location: [facility];  Service: Urology;  Laterality: N/A;  ONLY NEEDS 60 MIN    FAMILY HISTORY: The patient family history includes Benign prostatic hyperplasia in his father; Cancer in his sister; Hypertension in his mother; Other in his father.  SOCIAL HISTORY:  The patient  reports that he has never smoked. He has never used smokeless tobacco. He reports that he does not drink alcohol and does not use drugs.  REVIEW OF SYSTEMS: Review of Systems  Constitutional: Negative for chills and fever.  HENT: Negative for hoarse voice and nosebleeds.   Eyes: Negative for discharge, double vision and pain.  Cardiovascular: Positive for chest pain. Negative for claudication, dyspnea on exertion, leg swelling, near-syncope, orthopnea, palpitations, paroxysmal nocturnal dyspnea and syncope.  Respiratory: Negative for hemoptysis and shortness of breath.   Musculoskeletal: Negative for muscle cramps and myalgias.  Gastrointestinal: Negative for abdominal pain, constipation, diarrhea, hematemesis, hematochezia, melena, nausea and vomiting.  Neurological: Negative for dizziness and light-headedness.   PHYSICAL EXAM: Vitals with BMI 02/21/2020 01/23/2020 12/25/2019  Height 5\' 8"  5\' 8"  5\' 8"   Weight 162 lbs 6 oz 166 lbs 166 lbs  BMI 24.7 25.25 25.25  Systolic 134 122  Diastolic 85 86 84  Pulse 86 73 68   CONSTITUTIONAL: Well-developed and well-nourished. No acute distress.  SKIN: Skin is warm and dry. No rash noted. No cyanosis. No pallor. No jaundice HEAD: Normocephalic and atraumatic.  EYES: No scleral icterus MOUTH/THROAT: Moist oral membranes.  NECK: No JVD  present. No thyromegaly noted. No carotid bruits  LYMPHATIC: No visible cervical adenopathy.  CHEST Normal respiratory effort. No intercostal retractions  LUNGS: Clear to auscultation bilaterally.  no stridor. No wheezes. No rales.  CARDIOVASCULAR: Regular rate and rhythm, positive S1-S2, no murmurs rubs or gallops appreciated ABDOMINAL: Soft, nontender, nondistended, positive bowel in all 4 quadrants.  No apparent ascites.  EXTREMITIES: No peripheral edema  HEMATOLOGIC: No significant bruising NEUROLOGIC: Oriented to person, place, and time. Nonfocal. Normal muscle tone.  PSYCHIATRIC: Normal mood and affect. Normal behavior. Cooperative  CARDIAC DATABASE: EKG: 02/21/2020: Normal sinus rhythm, 77 bpm, normal axis, without underlying injury pattern.   Echocardiogram: 06/27/2019: Moderate global hypokinesis. LVEF 35-40%. Doppler evidence of grade I (impaired) diastolic dysfunction, normal LAP. Mild TR.   Stress Testing: Lexiscan Sestamibi Stress Test 08/15/2019:  Nondiagnostic ECG stress.  Myocardial perfusion is normal.  There is a fixed mild defect in the inferior region suggestive of diaphragmatic attenuation.  Normal LV size. Overall LV systolic function is abnormal without regional wall motion abnormalities. Stress LV EF: 47%.  No previous exam available for comparison. Low risk.  Heart Catheterization: None  Event monitor 06/27/2019 - 07/11/2019: Diagnostic time: 100%  Dominant rhythm:  Normal Sinus. HR 51-154 bpm. Avg HR 80 bpm. No atrial fibrillation, high grade AV block, or sinus pause >3sec noted. Heart rate >100bpm for approximately 9% of the monitoring period.  Heart rate <60bpm for <1% of the monitoring period.  Total ventricular ectopy <1%.  No patient triggered events recorded.   Carotid Duplex 06/27/2019: No hemodynamically significant arterial disease in the internal carotid artery bilaterally. Minimal increased intimal thickness bilaterally. Antegrade right  vertebral artery flow. Antegrade left vertebral artery flow.  LABORATORY DATA: CBC Latest Ref Rng & Units 12/04/2019 12/03/2019 11/29/2019  WBC 4.0 - 10.5 K/uL 8.3 7.4 3.3(L)  Hemoglobin 13.0 - 17.0 g/dL 12.8(L) 13.2 14.2  Hematocrit 39.0 - 52.0 % 41.0 41.8 44.8  Platelets 150 - 400 K/uL 179 171 179    CMP Latest Ref Rng & Units 01/30/2020 01/01/2020 12/04/2019  Glucose 65 - 99 mg/dL 88 74 053(Z)  BUN 8 - 27 mg/dL 9 12 8   Creatinine 0.76 - 1.27 mg/dL 7.67 3.41  Sodium 134 - 144 mmol/L 141 140 134(L)  Potassium 3.5 - 5.2 mmol/L 4.4 4.4 4.6  Chloride 96 - 106 mmol/L 102 100 104  CO2 20 - 29 mmol/L 25 29 21(L)  Calcium 8.6 - 10.2 mg/dL 9.3 9.3 8.0(L)  Total Protein 6.0 - 8.5 g/dL - - -  Total Bilirubin 0.0 - 1.2 mg/dL - - -  Alkaline Phos 39 - 117 IU/L - - -  AST 0 - 40 IU/L - - -  ALT 0 - 44 IU/L - - -    Lipid Panel     Component Value Date/Time   CHOL 144 07/09/2019 1300   TRIG 46 07/09/2019 1300   HDL 62 07/09/2019 1300   CHOLHDL 2.3 07/09/2019 1300   LDLCALC 72 07/09/2019 1300   LABVLDL 10 07/09/2019 1300    No results found for: HGBA1C No components found for: NTPROBNP Lab Results  Component Value Date   TSH 0.576 05/24/2019    BMP Recent Labs    08/28/19 1103 11/29/19 0912 12/04/19 0540 01/01/20 1253 01/30/20 1220  NA 140   < > 134* 140 141  K 5.2   < > 4.6 4.4 4.4  CL 103   < > 104 100 102  CO2 24   < > 21* 29 25  GLUCOSE 91   < > 195* 74 88  BUN 11   < > 8 12 9   CREATININE 1.07   < > 0.97 1.15 1.02  CALCIUM 9.6   < > 8.0* 9.3 9.3  GFRNONAA 72   < > >60 66 77  GFRAA 84  --   --  77 89   < > = values in this interval not displayed.   BNP (last 3 results) Recent Labs    08/28/19 1103 01/01/20 1253 01/30/20 1220  PROBNP 12 21 20     TSH Recent Labs    05/24/19 1212  TSH 0.576    CHOLESTEROL Recent Labs    07/09/19 1300  CHOL 144    Hepatic Function Panel Recent Labs    05/24/19 1212  PROT 7.8  ALBUMIN 4.5  AST 28  ALT  21  ALKPHOS 60  BILITOT 0.5    IMPRESSION:    ICD-10-CM   1. HFrEF (heart failure with reduced ejection fraction) (HCC)  I50.20 dapagliflozin propanediol (FARXIGA) 10 MG TABS tablet  2. Precordial pain  R07.2 EKG 12-Lead  3. Dyspnea on exertion  R06.00   4. Essential hypertension  I10  5. Mixed hyperlipidemia  E78.2   6. History of stroke  Z86.73      RECOMMENDATIONS: Wynton Elstad is a 65 y.o. male whose past medical history and cardiac risk factors include:  history of stroke, hypertension, hyperlipidemia, and and heart failure with reduced EF, stage B, NYHA class II/III, advanced age.    Heart failure with reduced EF, stage B, NYHA class II:  Since last office visit no hospitalizations for congestive heart failure.  Continue current medical therapy.    Patient tolerated initiation of Farxiga well without any side effects or intolerances.    Refill Marcelline Deist.  Repeat lab work from 01/30/2020 independently reviewed kidney function, potassium, magnesium levels within normal limits.  NT proBNP 20.   Will consider the addition of spironolactone if hemodynamics and laboratory values allow.  Precordial pain:  Patient symptoms of precordial pain appears to be noncardiac in origin.   The symptoms started after he restarted boxing again.  The pain is reproducible and overall improving over the last 5 to 6 days.  Recommended using either nonsteroidals or Tylenol for pain and if it resolves is most likely musculoskeletal.  However, if the pain worsens in intensity, frequency, duration or has typical cardiac pain as discussed in the office he is asked to seek medical attention sooner by going to the closest ER via EMS.  Patient has undergone an echocardiogram and stress test as noted above.  If the pain continues we discussed possibly considering coronary CTA.    For now he would like to hold off on any additional testing and will call the office for sooner appointment or to have  the coronary CTA scheduled.  Benign essential hypertension: Blood pressure is very well controlled.  Medications reviewed.   History of stroke: Continue aspirin and statin therapy.   Mixed hyperlipidemia: Continue statin therapy.  Currently managed by primary team.  FINAL MEDICATION LIST END OF ENCOUNTER: Meds ordered this encounter  Medications  . dapagliflozin propanediol (FARXIGA) 10 MG TABS tablet    Sig: Take 1 tablet (10 mg total) by mouth daily before breakfast.    Dispense:  90 tablet    Refill:  1    Medications Discontinued During This Encounter  Medication Reason  . dapagliflozin propanediol (FARXIGA) 10 MG TABS tablet Reorder     Current Outpatient Medications:  .  ascorbic acid (VITAMIN C) 1000 MG tablet, Take by mouth., Disp: , Rfl:  .  aspirin EC 81 MG tablet, Take 1 tablet (81 mg total) by mouth daily. Swallow whole., Disp: 90 tablet, Rfl: 3 .  metoprolol succinate (TOPROL-XL) 50 MG 24 hr tablet, Take 1 tablet (50 mg total) by mouth daily. Take with or immediately following a meal., Disp: 90 tablet, Rfl: 1 .  nitroGLYCERIN (NITROSTAT) 0.4 MG SL tablet, Place 1 tablet (0.4 mg total) under the tongue every 5 (five) minutes as needed for chest pain. If you require more than two tablets five minutes apart go to the nearest ER via EMS., Disp: 30 tablet, Rfl: 0 .  sacubitril-valsartan (ENTRESTO) 97-103 MG, Take 1 tablet by mouth 2 (two) times daily., Disp: 180 tablet, Rfl: 0 .  simvastatin (ZOCOR) 80 MG tablet, TAKE 1 TABLET DAILY, Disp: 90 tablet, Rfl: 0 .  dapagliflozin propanediol (FARXIGA) 10 MG TABS tablet, Take 1 tablet (10 mg total) by mouth daily before breakfast., Disp: 90 tablet, Rfl: 1  Orders Placed This Encounter  Procedures  . EKG 12-Lead   --Continue cardiac medications as reconciled in final medication  list. --Return in about 3 months (around 05/20/2020) for Follow up, heart failure management.. Or sooner if needed. --Continue follow-up with your primary  care physician regarding the management of your other chronic comorbid conditions.  Patient's questions and concerns were addressed to his satisfaction. He voices understanding of the instructions provided during this encounter.   This note was created using a voice recognition software as a result there may be grammatical errors inadvertently enclosed that do not reflect the nature of this encounter. Every attempt is made to correct such errors.  Tessa Lerner, [place], [facility]  Pager: [phone] Office: [phone]

## 2020-03-06 ENCOUNTER — Other Ambulatory Visit: Payer: Self-pay | Admitting: Cardiology

## 2020-03-06 DIAGNOSIS — I5020 Unspecified systolic (congestive) heart failure: Secondary | ICD-10-CM

## 2020-04-07 ENCOUNTER — Other Ambulatory Visit: Payer: Self-pay | Admitting: Medical

## 2020-04-22 ENCOUNTER — Encounter: Payer: Self-pay | Admitting: Emergency Medicine

## 2020-04-22 ENCOUNTER — Other Ambulatory Visit: Payer: Self-pay

## 2020-04-22 ENCOUNTER — Ambulatory Visit
Admission: EM | Admit: 2020-04-22 | Discharge: 2020-04-22 | Disposition: A | Discharge status: Home / Self Care | Payer: BC Managed Care – PPO | Attending: Internal Medicine | Admitting: Internal Medicine

## 2020-04-22 DIAGNOSIS — R002 Palpitations: Secondary | ICD-10-CM | POA: Diagnosis not present

## 2020-04-22 DIAGNOSIS — S46819A Strain of other muscles, fascia and tendons at shoulder and upper arm level, unspecified arm, initial encounter: Secondary | ICD-10-CM

## 2020-04-22 DIAGNOSIS — S46219A Strain of muscle, fascia and tendon of other parts of biceps, unspecified arm, initial encounter: Secondary | ICD-10-CM

## 2020-04-22 MED ORDER — DICLOFENAC SODIUM 1 % EX GEL: 2.0000 g | Freq: Four times a day (QID) | CUTANEOUS | 0 refills | Status: AC

## 2020-04-22 NOTE — ED Provider Notes (Signed)
[facility]    CSN: [id number] Arrival date & time: 04/22/20  0949      History   Chief Complaint Chief Complaint  Patient presents with  . Shoulder Pain    HPI Adrian Willis is a 65 y.o. male who presents with R shoulder pain since yesterday after lifting his 1st aid kit box from the closet. He did not drop, it but just thought was one of his common aches and would resolve. As the day went on, the pain seemed worse and he laid to rest, when he tried to roll over felt the pain worse. And through the night has felt this pain. Cant lift arm due to pain, not to even brush his teeth.   2- palpitations since last night. Felt as if his shirt is wet on his L chest area and has to lift it off his skin and gets better. Denies rasj on this area. . Denies pain, sweats or SOB. Has had it all morning intermittently and lasting few seconds but did not feel the wet shirt sensation. When he felt a harder pounding in his chest in the lobby outside he  felt light headed, but that has resolved     Past Medical History:  Diagnosis Date  . BPH (benign prostatic hyperplasia)   . CHF (congestive heart failure) ([place])   . CVA (cerebral vascular accident) ([place]) 2011   numbness right index finger and right thumb and right thigh  . GERD (gastroesophageal reflux disease)   . Hyperlipidemia   . Hypertension     Patient Active Problem List   Diagnosis Date Noted  . BPH (benign prostatic hyperplasia) 12/03/2019  . Hyperlipidemia 07/09/2019  . Screen for colon cancer 07/09/2019  . Need for pneumococcal vaccination 07/09/2019  . Vaccine counseling 07/09/2019  . Dizziness 05/24/2019  . Ataxia 05/24/2019  . Palpitation 05/24/2019  . Essential hypertension, benign 05/24/2019  . History of stroke 05/24/2019  . Benign prostatic hyperplasia with urinary frequency 05/24/2019  . Erectile dysfunction 05/24/2019  . Non-recurrent acute serous otitis media of right ear 05/24/2019    Past Surgical  History:  Procedure Laterality Date  . CYSTOSCOPY WITH INSERTION OF UROLIFT    . LASIK    . thumb     right, nerve injury repair  . TRANSURETHRAL RESECTION OF PROSTATE N/A 12/03/2019   Procedure: TRANSURETHRAL RESECTION OF THE PROSTATE (TURP), BIPOLAR;  Surgeon: Janith Lima, MD;  Location: [facility];  Service: Urology;  Laterality: N/A;  ONLY NEEDS 60 MIN       Home Medications    Prior to Admission medications   Medication Sig Start Date End Date Taking? Authorizing Provider  diclofenac Sodium (VOLTAREN) 1 % GEL Apply 2 g topically 4 (four) times daily for 7 days. Then prn 04/22/20 04/29/20 Yes Rodriguez-Southworth, Sunday Spillers, PA-C  ascorbic acid (VITAMIN C) 1000 MG tablet Take by mouth.    [provider]  aspirin EC 81 MG tablet Take 1 tablet (81 mg total) by mouth daily. Swallow whole. 09/25/19   Tolia, Sunit, DO  dapagliflozin propanediol (FARXIGA) 10 MG TABS tablet Take 1 tablet (10 mg total) by mouth daily before breakfast. 02/21/20 08/19/20  Tolia, Sunit, DO  ENTRESTO 97-103 MG TAKE 1 TABLET TWICE A DAY 03/06/20   Tolia, Sunit, DO  metoprolol succinate (TOPROL-XL) 50 MG 24 hr tablet Take 1 tablet (50 mg total) by mouth daily. Take with or immediately following a meal. 01/23/20 07/21/20  Tolia, Sunit, DO  nitroGLYCERIN (NITROSTAT) 0.4  MG SL tablet Place 1 tablet (0.4 mg total) under the tongue every 5 (five) minutes as needed for chest pain. If you require more than two tablets five minutes apart go to the nearest ER via EMS. 08/01/19 12/25/19  Rex Kras, DO  simvastatin (ZOCOR) 80 MG tablet TAKE 1 TABLET DAILY 04/07/20   Tysinger, Camelia Eng, PA-C    Family History Family History  Problem Relation Age of Onset  . Hypertension Mother   . Benign prostatic hyperplasia Father   . Other Father        died after horrible bed sore and demise  . Cancer Sister   . Heart disease Neg Hx   . Stroke Neg Hx     Social History Social History   Tobacco Use  . Smoking status: Never Smoker  .  Smokeless tobacco: Never Used  Vaping Use  . Vaping Use: Never used  Substance Use Topics  . Alcohol use: Never  . Drug use: Never     Allergies   Patient has no known allergies.   Review of Systems Review of Systems   Physical Exam Triage Vital Signs ED Triage Vitals  Enc Vitals Group     BP 04/22/20 1009 121/82     Pulse Rate 04/22/20 1009 78     Resp 04/22/20 1009 18     Temp 04/22/20 1009 98 F (36.7 C)     Temp src --      SpO2 04/22/20 1009 97 %     Weight --      Height --      Head Circumference --      Peak Flow --      Pain Score 04/22/20 1026 10     Pain Loc --      Pain Edu? --      Excl. in [place]? --    No data found.  Updated Vital Signs BP 121/82   Pulse 78   Temp 98 F (36.7 C)   Resp 18   SpO2 97%   Visual Acuity Right Eye Distance:   Left Eye Distance:   Bilateral Distance:    Right Eye Near:   Left Eye Near:    Bilateral Near:     Physical Exam Vitals reviewed.  Constitutional:      General: He is not in acute distress.    Appearance: Normal appearance. He is not toxic-appearing.  HENT:     Head: Normocephalic.     Right Ear: External ear normal.     Left Ear: External ear normal.  Eyes:     General: No scleral icterus.    Conjunctiva/sclera: Conjunctivae normal.  Cardiovascular:     Rate and Rhythm: Normal rate and regular rhythm.     Heart sounds: No murmur heard.   Pulmonary:     Effort: Pulmonary effort is normal.  Musculoskeletal:        General: No swelling or signs of injury.     Cervical back: Neck supple.     Comments: R SHOULDER- with tender bicep and deltoid area. Unable to lift more than 20 degrees due to pain. + yergeson test Unable to do other testing With passive ROM, extension > 45 degrees provoked pain and as I abducted arm down.   Skin:    General: Skin is warm and dry.     Findings: No rash.  Neurological:     Mental Status: He is alert and oriented to person, place, and time.  Gait: Gait  normal.     Deep Tendon Reflexes: Reflexes normal.  Psychiatric:        Mood and Affect: Mood normal.        Behavior: Behavior normal.        Judgment: Judgment normal.      UC Treatments / Results  Labs (all labs ordered are listed, but only abnormal results are displayed) Labs Reviewed - No data to display  EKG Normal sinus rhythm. Normal EKG  Radiology No results found.  Procedures Procedures (including critical care time)  Medications Ordered in UC Medications - No data to display  Initial Impression / Assessment and Plan / UC Course  I have reviewed the triage vital signs and the nursing notes. Pertinent  imaging results that were available during my care of the patient were reviewed by me and considered in my medical decision making (see chart for details). Has strain R deltoid and bicep and advised to ice area and I sent Voltaren gel to apply to area of pain as noted. He is to watch for a rash on L chest area since he has discomfort with just his shirt on it in case this is early shingles.  See instructions. If the palpitations returns needs to go to ER.   Final Clinical Impressions(s) / UC Diagnoses   Final diagnoses:  Biceps strain, initial encounter  Strain of deltoid muscle, initial encounter  Palpitations     Discharge Instructions     Take Tylenol 500 -100 mg every 8 hours for pain Ice area of pain for 20 minutes 3-5 times a day, today and tomorrow Watch your chest wall for rashes or twitching when you have what feels like palpitations.  If the palpitations get worse, you need to go to ER and be placed on a monitor to watch heart rate. Your EKG today is normal as well as your heart exam.  Follow up with your PCP next week, in case you may need PT to help you heal.     ED Prescriptions    Medication Sig Dispense Auth. Provider   diclofenac Sodium (VOLTAREN) 1 % GEL Apply 2 g topically 4 (four) times daily for 7 days. Then prn 350 g  Rodriguez-Southworth, Sunday Spillers, PA-C     PDMP not reviewed this encounter.   Shelby Mattocks, PA-C 04/22/20 1700

## 2020-04-22 NOTE — ED Triage Notes (Addendum)
RT shoulder pain that started yesterday after working in the yard yesterday.  States he has been unable to lift his arm because of the pain. Also reports he has been having palpitations all morning.

## 2020-04-22 NOTE — Discharge Instructions (Addendum)
Take Tylenol 500 -100 mg every 8 hours for pain Ice area of pain for 20 minutes 3-5 times a day, today and tomorrow Watch your chest wall for rashes or twitching when you have what feels like palpitations.  If the palpitations get worse, you need to go to ER and be placed on a monitor to watch heart rate. Your EKG today is normal as well as your heart exam.  Follow up with your PCP next week, in case you may need PT to help you heal.

## 2020-04-22 NOTE — ED Notes (Addendum)
R shoulder pain, no injury, pain after putting vent on dryer vent yesterday.

## 2020-05-20 ENCOUNTER — Ambulatory Visit: Payer: BC Managed Care – PPO | Admitting: Cardiology

## 2020-05-20 ENCOUNTER — Other Ambulatory Visit: Payer: Self-pay

## 2020-05-20 ENCOUNTER — Encounter: Payer: Self-pay | Admitting: Cardiology

## 2020-05-20 VITALS — BP 129/83 | HR 74 | Temp 98.2°F | Resp 16 | Ht 68.0 in | Wt 158.0 lb

## 2020-05-20 DIAGNOSIS — R0600 Dyspnea, unspecified: Secondary | ICD-10-CM

## 2020-05-20 DIAGNOSIS — R0609 Other forms of dyspnea: Secondary | ICD-10-CM

## 2020-05-20 DIAGNOSIS — I5020 Unspecified systolic (congestive) heart failure: Secondary | ICD-10-CM

## 2020-05-20 DIAGNOSIS — I10 Essential (primary) hypertension: Secondary | ICD-10-CM

## 2020-05-20 DIAGNOSIS — E782 Mixed hyperlipidemia: Secondary | ICD-10-CM

## 2020-05-20 NOTE — Progress Notes (Signed)
Date:  05/20/2020   ID:  Adrian Willis, DOB 03/24/1955, MRN [id number]  PCP:  Jac Canavan, PA-C  Cardiologist:  Tessa Lerner, DO, [facility] (established care 06/20/2019)  Date: 05/20/20 Last Office Visit: 02/21/2020  Chief Complaint  Patient presents with  . HFrEF  . Follow-up    3 month    HPI  [name] is a 65 y.o. male who presents to the office with a chief complaint of "19-month follow-up for management of congestive heart failure." Patient's past medical history and cardiovascular risk factors include history of stroke, hypertension, hyperlipidemia, and heart failure with reduced EF, stage B, NYHA class II, advanced age.     He was referred to the office by his PCP back in June 2021 for evaluation of palpitations.  He underwent a monitor which noted sinus tachycardia without any significant dysrhythmias.  He was started on beta-blocker therapy and his symptoms resolved.  He also underwent an echocardiogram which noted moderately reduced left ventricular systolic function and given his symptoms of effort related dyspnea underwent an ischemic evaluation.  The stress test was reported to be overall low risk study.  However, independent review of SPECT images concerning for small size, mild intensity, perfusion defect in the RCA distribution.  However, patient stated that the medical therapy had improved his symptoms of effort related dyspnea and palpitations and he wanted treated medically.  His medical therapy has been uptitrated GDMT.  Now presents for 53-month follow-up.  Patient states that the chest pain is essentially resolved.  No use of sublingual nitroglycerin tablets.  Patient's not experiencing shortness of breath with effort related activities.  He is back to his baseline functional capacity.  He has lost 4 pounds since last office visit.  For which he is congratulated for.  FUNCTIONAL STATUS: Walks a lot at work; however, no exercise program or daily routine.     ALLERGIES: No Known Allergies  MEDICATION LIST PRIOR TO VISIT: Current Meds  Medication Sig  . ascorbic acid (VITAMIN C) 1000 MG tablet Take by mouth.  Marland Kitchen aspirin EC 81 MG tablet Take 1 tablet (81 mg total) by mouth daily. Swallow whole.  . dapagliflozin propanediol (FARXIGA) 10 MG TABS tablet Take 1 tablet (10 mg total) by mouth daily before breakfast.  . ENTRESTO 97-103 MG TAKE 1 TABLET TWICE A DAY  . metoprolol succinate (TOPROL-XL) 50 MG 24 hr tablet Take 1 tablet (50 mg total) by mouth daily. Take with or immediately following a meal.  . nitroGLYCERIN (NITROSTAT) 0.4 MG SL tablet Place 1 tablet (0.4 mg total) under the tongue every 5 (five) minutes as needed for chest pain. If you require more than two tablets five minutes apart go to the nearest ER via EMS.  . simvastatin (ZOCOR) 80 MG tablet TAKE 1 TABLET DAILY     PAST MEDICAL HISTORY: Past Medical History:  Diagnosis Date  . BPH (benign prostatic hyperplasia)   . CHF (congestive heart failure) (HCC)   . CVA (cerebral vascular accident) (HCC) 2011   numbness right index finger and right thumb and right thigh  . GERD (gastroesophageal reflux disease)   . Hyperlipidemia   . Hypertension     PAST SURGICAL HISTORY: Past Surgical History:  Procedure Laterality Date  . CYSTOSCOPY WITH INSERTION OF UROLIFT    . LASIK    . thumb     right, nerve injury repair  . TRANSURETHRAL RESECTION OF PROSTATE N/A 12/03/2019   Procedure: TRANSURETHRAL RESECTION OF THE PROSTATE (TURP), BIPOLAR;  Surgeon: Jannifer Hick, MD;  Location: [facility];  Service: Urology;  Laterality: N/A;  ONLY NEEDS 60 MIN    FAMILY HISTORY: The patient family history includes Benign prostatic hyperplasia in his father; Cancer in his sister; Hypertension in his mother; Other in his father.  SOCIAL HISTORY:  The patient  reports that he has never smoked. He has never used smokeless tobacco. He reports that he does not drink alcohol and does not use  drugs.  REVIEW OF SYSTEMS: Review of Systems  Constitutional: Negative for chills and fever.  HENT: Negative for hoarse voice and nosebleeds.   Eyes: Negative for discharge, double vision and pain.  Cardiovascular: Negative for chest pain, claudication, dyspnea on exertion, leg swelling, near-syncope, orthopnea, palpitations, paroxysmal nocturnal dyspnea and syncope.  Respiratory: Negative for hemoptysis and shortness of breath.   Musculoskeletal: Negative for muscle cramps and myalgias.  Gastrointestinal: Negative for abdominal pain, constipation, diarrhea, hematemesis, hematochezia, melena, nausea and vomiting.  Neurological: Negative for dizziness and light-headedness.   PHYSICAL EXAM: Vitals with BMI 05/20/2020 04/22/2020 02/21/2020  Height 5\' 8"  - 5\' 8"   Weight 158 lbs - 162 lbs 6 oz  BMI 24.03 - 24.7  Systolic 129 121  Diastolic 83 82 85  Pulse 74 78 86   CONSTITUTIONAL: Well-developed and well-nourished. No acute distress.  SKIN: Skin is warm and dry. No rash noted. No cyanosis. No pallor. No jaundice HEAD: Normocephalic and atraumatic.  EYES: No scleral icterus MOUTH/THROAT: Moist oral membranes.  NECK: No JVD present. No thyromegaly noted. No carotid bruits  LYMPHATIC: No visible cervical adenopathy.  CHEST Normal respiratory effort. No intercostal retractions  LUNGS: Clear to auscultation bilaterally.  no stridor. No wheezes. No rales.  CARDIOVASCULAR: Regular rate and rhythm, positive S1-S2, no murmurs rubs or gallops appreciated ABDOMINAL: Soft, nontender, nondistended, positive bowel in all 4 quadrants.  No apparent ascites.  EXTREMITIES: No peripheral edema  HEMATOLOGIC: No significant bruising NEUROLOGIC: Oriented to person, place, and time. Nonfocal. Normal muscle tone.  PSYCHIATRIC: Normal mood and affect. Normal behavior. Cooperative  CARDIAC DATABASE: EKG: 02/21/2020: Normal sinus rhythm, 77 bpm, normal axis, without underlying injury pattern.    Echocardiogram: 06/27/2019: Moderate global hypokinesis. LVEF 35-40%. Doppler evidence of grade I (impaired) diastolic dysfunction, normal LAP. Mild TR.   Stress Testing: Lexiscan Sestamibi Stress Test 08/15/2019:  Nondiagnostic ECG stress.  Myocardial perfusion is normal.  There is a fixed mild defect in the inferior region suggestive of diaphragmatic attenuation.  Normal LV size. Overall LV systolic function is abnormal without regional wall motion abnormalities. Stress LV EF: 47%.  No previous exam available for comparison. Low risk.  Heart Catheterization: None  Event monitor 06/27/2019 - 07/11/2019: Diagnostic time: 100%  Dominant rhythm: Normal Sinus. HR 51-154 bpm. Avg HR 80 bpm. No atrial fibrillation, high grade AV block, or sinus pause >3sec noted. Heart rate >100bpm for approximately 9% of the monitoring period.  Heart rate <60bpm for <1% of the monitoring period.  Total ventricular ectopy <1%.  No patient triggered events recorded.   Carotid Duplex 06/27/2019: No hemodynamically significant arterial disease in the internal carotid artery bilaterally. Minimal increased intimal thickness bilaterally. Antegrade right vertebral artery flow. Antegrade left vertebral artery flow.  LABORATORY DATA: CBC Latest Ref Rng & Units 12/04/2019 12/03/2019 11/29/2019  WBC 4.0 - 10.5 K/uL 8.3 7.4 3.3(L)  Hemoglobin 13.0 - 17.0 g/dL 12.8(L) 13.2 14.2  Hematocrit 39.0 - 52.0 % 41.0 41.8 44.8  Platelets 150 - 400 K/uL 179 171 179  CMP Latest Ref Rng & Units 01/30/2020 01/01/2020 12/04/2019  Glucose 65 - 99 mg/dL 88 74 782(N195(H)  BUN 8 - 27 mg/dL 9 12 8   Creatinine 0.76 - 1.27 mg/dL 5.621.02 1.301.15 8.650.97  Sodium 134 - 144 mmol/L 141 140 134(L)  Potassium 3.5 - 5.2 mmol/L 4.4 4.4 4.6  Chloride 96 - 106 mmol/L 102 100 104  CO2 20 - 29 mmol/L 25 29 21(L)  Calcium 8.6 - 10.2 mg/dL 9.3 9.3 8.0(L)  Total Protein 6.0 - 8.5 g/dL - - -  Total Bilirubin 0.0 - 1.2 mg/dL - - -  Alkaline Phos 39 -  117 IU/L - - -  AST 0 - 40 IU/L - - -  ALT 0 - 44 IU/L - - -    Lipid Panel     Component Value Date/Time   CHOL 144 07/09/2019 1300   TRIG 46 07/09/2019 1300   HDL 62 07/09/2019 1300   CHOLHDL 2.3 07/09/2019 1300   LDLCALC 72 07/09/2019 1300   LABVLDL 10 07/09/2019 1300    No results found for: HGBA1C No components found for: NTPROBNP Lab Results  Component Value Date   TSH 0.576 05/24/2019    BMP Recent Labs    08/28/19 1103 11/29/19 0912 12/04/19 0540 01/01/20 1253 01/30/20 1220  NA 140   < > 134* 140 141  K 5.2   < > 4.6 4.4 4.4  CL 103   < > 104 100 102  CO2 24   < > 21* 29 25  GLUCOSE 91   < > 195* 74 88  BUN 11   < > 8 12 9   CREATININE 1.07   < > 0.97 1.15 1.02  CALCIUM 9.6   < > 8.0* 9.3 9.3  GFRNONAA 72   < > >60 66 77  GFRAA 84  --   --  77 89   < > = values in this interval not displayed.   BNP (last 3 results) Recent Labs    08/28/19 1103 01/01/20 1253 01/30/20 1220  PROBNP 12 21 20     TSH Recent Labs    05/24/19 1212  TSH 0.576    CHOLESTEROL Recent Labs    07/09/19 1300  CHOL 144    Hepatic Function Panel Recent Labs    05/24/19 1212  PROT 7.8  ALBUMIN 4.5  AST 28  ALT 21  ALKPHOS 60  BILITOT 0.5    IMPRESSION:    ICD-10-CM   1. HFrEF (heart failure with reduced ejection fraction) (HCC)  I50.20   2. Dyspnea on exertion  R06.00   3. Essential hypertension  I10   4. Mixed hyperlipidemia  E78.2      RECOMMENDATIONS: Daymon LarsenFredrick Windish is a 65 y.o. male whose past medical history and cardiac risk factors include:  history of stroke, hypertension, hyperlipidemia, and and heart failure with reduced EF, stage B, NYHA class II/III, advanced age.    Heart failure with reduced EF, stage B, NYHA class II:  Euvolemic.  No recent hospitalizations for congestive heart failure.  Medications reconciled.  Most recent labs reviewed.  Clinically patient is very stable and is satisfied with the progress that he is made since  establishing care.  Will consider the addition of spironolactone if hemodynamics and laboratory values allow.  Precordial pain:  Patient symptoms of precordial pain were suggestive of noncardiac etiology.  Given the MPI results and non cardiac discomfort we discussed at the last office visit to consider coronary CTA; however the shared  decision was to hold off on additional testing. Continue the same.   Today patient states that his precordial chest pain is essentially resolved and very infrequent episodes.  He has not required the use of sublingual nitroglycerin tablets.  His functional status is back to baseline.   Benign essential hypertension: Blood pressure is very well controlled.  Medications reviewed.   History of stroke: Continue aspirin and statin therapy.   Mixed hyperlipidemia: Continue statin therapy.  Currently managed by primary team.  FINAL MEDICATION LIST END OF ENCOUNTER: No orders of the defined types were placed in this encounter.   There are no discontinued medications.   Current Outpatient Medications:  .  ascorbic acid (VITAMIN C) 1000 MG tablet, Take by mouth., Disp: , Rfl:  .  aspirin EC 81 MG tablet, Take 1 tablet (81 mg total) by mouth daily. Swallow whole., Disp: 90 tablet, Rfl: 3 .  dapagliflozin propanediol (FARXIGA) 10 MG TABS tablet, Take 1 tablet (10 mg total) by mouth daily before breakfast., Disp: 90 tablet, Rfl: 1 .  ENTRESTO 97-103 MG, TAKE 1 TABLET TWICE A DAY, Disp: 180 tablet, Rfl: 3 .  metoprolol succinate (TOPROL-XL) 50 MG 24 hr tablet, Take 1 tablet (50 mg total) by mouth daily. Take with or immediately following a meal., Disp: 90 tablet, Rfl: 1 .  nitroGLYCERIN (NITROSTAT) 0.4 MG SL tablet, Place 1 tablet (0.4 mg total) under the tongue every 5 (five) minutes as needed for chest pain. If you require more than two tablets five minutes apart go to the nearest ER via EMS., Disp: 30 tablet, Rfl: 0 .  simvastatin (ZOCOR) 80 MG tablet, TAKE 1  TABLET DAILY, Disp: 90 tablet, Rfl: 0  No orders of the defined types were placed in this encounter.  --Continue cardiac medications as reconciled in final medication list. --Return in about 6 months (around 11/20/2020) for Follow up, heart failure management.. Or sooner if needed. --Continue follow-up with your primary care physician regarding the management of your other chronic comorbid conditions.  Patient's questions and concerns were addressed to his satisfaction. He voices understanding of the instructions provided during this encounter.   This note was created using a voice recognition software as a result there may be grammatical errors inadvertently enclosed that do not reflect the nature of this encounter. Every attempt is made to correct such errors.  Tessa Lerner, [place], [facility]  Pager: [phone] Office: [phone]

## 2020-07-07 ENCOUNTER — Other Ambulatory Visit: Payer: Self-pay | Admitting: Medical

## 2020-07-09 ENCOUNTER — Encounter: Payer: BC Managed Care – PPO | Admitting: Medical

## 2020-07-22 ENCOUNTER — Telehealth: Payer: Self-pay | Admitting: Medical

## 2020-07-22 ENCOUNTER — Encounter: Payer: BC Managed Care – PPO | Admitting: Medical

## 2020-07-22 DIAGNOSIS — Z0000 Encounter for general adult medical examination without abnormal findings: Secondary | ICD-10-CM

## 2020-07-22 NOTE — Telephone Encounter (Signed)

## 2020-07-29 ENCOUNTER — Encounter: Payer: Self-pay | Admitting: Medical

## 2020-08-01 ENCOUNTER — Other Ambulatory Visit: Payer: Self-pay | Admitting: Cardiology

## 2020-08-01 DIAGNOSIS — I5020 Unspecified systolic (congestive) heart failure: Secondary | ICD-10-CM

## 2020-08-11 ENCOUNTER — Ambulatory Visit (INDEPENDENT_AMBULATORY_CARE_PROVIDER_SITE_OTHER): Payer: BC Managed Care – PPO | Admitting: Medical

## 2020-08-11 ENCOUNTER — Encounter: Payer: Self-pay | Admitting: Medical

## 2020-08-11 ENCOUNTER — Other Ambulatory Visit: Payer: Self-pay

## 2020-08-11 VITALS — BP 120/72 | HR 68 | Ht 68.0 in | Wt 156.8 lb

## 2020-08-11 DIAGNOSIS — Z0000 Encounter for general adult medical examination without abnormal findings: Secondary | ICD-10-CM | POA: Insufficient documentation

## 2020-08-11 DIAGNOSIS — I5020 Unspecified systolic (congestive) heart failure: Secondary | ICD-10-CM | POA: Insufficient documentation

## 2020-08-11 DIAGNOSIS — Z125 Encounter for screening for malignant neoplasm of prostate: Secondary | ICD-10-CM | POA: Insufficient documentation

## 2020-08-11 DIAGNOSIS — M25511 Pain in right shoulder: Secondary | ICD-10-CM | POA: Diagnosis not present

## 2020-08-11 NOTE — Progress Notes (Signed)
Subjective:  Adrian Willis is a 65 y.o. male who presents for Chief Complaint  Patient presents with   Shoulder Pain    Right should pain, seen at [facility] and told he had inflammation. Given a cream which does not help. Pain is about the same as it was 2 months ago. Has lots of trouble sleeping.      Here for right shoulder pain.  He is right-handed.  Back in April he was trying to pull a studies out from the cabinet.  He did not realize the stove was mounted underneath to keep it from tipping over.  He jerked and jerked and jerked.  Eventually started having pain in the right shoulder.  The pain has continued.  No swelling.  No numbness or tingling.  No weakness.  No neck pain.  No prior chronic shoulder issues.  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  No swelling or bruising.  no other aggravating or relieving factors.    No other c/o.  The following portions of the patient's history were reviewed and updated as appropriate: allergies, current medications, past family history, past medical history, past social history, past surgical history and problem list.  ROS Otherwise as in subjective above    Objective: BP 120/72   Pulse 68   Ht 5\' 8"  (1.727 m)   Wt 156 lb 12.8 oz (71.1 kg)   BMI 23.84 kg/m   General appearance: alert, no distress, well developed, well nourished, African-American male Neck supple, normal range of motion, nontender Upper back nontender MSK: Nontender to palpation, no AC or biceps origin tenderness, no obvious laxity or swelling.  No bruising or erythema.  He has pain with shoulder flexion and abduction over 90 degrees.  There is some weakness with resisted Neer's and empty can test.  Tender with the same test.  Range of motion for internal and external range of motion is also reduced on the right.  Rest of arm nontender and no other deformity Otherwise arm neurovascular intact    Assessment: Encounter Diagnosis   Name Primary?   Right shoulder pain, unspecified chronicity Yes     Plan: We discussed his symptoms and exam findings.  Advise relative rest, use arm sling when possible for the next 7 to 10 days, ice when possible.  Can use Aleve over-the-counter daily for a few days or Tylenol would be a safer option for pain  No lifting greater than 10 pounds for the moment.  Limit use of the right arm in general until seen by sports medicine  We discussed some gentle stretching he can do for the time being  Referral to sports medicine   Follow up: pending referral

## 2020-08-18 ENCOUNTER — Other Ambulatory Visit: Payer: Self-pay

## 2020-08-18 ENCOUNTER — Ambulatory Visit: Payer: BC Managed Care – PPO | Admitting: Family Medicine

## 2020-08-18 ENCOUNTER — Ambulatory Visit: Payer: Self-pay

## 2020-08-18 ENCOUNTER — Ambulatory Visit
Admission: RE | Admit: 2020-08-18 | Discharge: 2020-08-18 | Disposition: A | Discharge status: Home / Self Care | Payer: BC Managed Care – PPO | Source: Ambulatory Visit | Attending: Family Medicine | Admitting: Family Medicine

## 2020-08-18 VITALS — BP 110/82 | Ht 68.0 in | Wt 154.0 lb

## 2020-08-18 DIAGNOSIS — M25511 Pain in right shoulder: Secondary | ICD-10-CM

## 2020-08-18 MED ORDER — PREDNISONE 10 MG PO TABS: ORAL_TABLET | ORAL | 0 refills | Status: DC

## 2020-08-18 NOTE — Patient Instructions (Signed)
I'm concerned you tore part of one of your rotator cuff muscles. We will go ahead with an MRI to assess the degree to which you did this and how healthy the rest of the muscle is. Icing or heat if needed. Tylenol as needed for pain. Try prednisone dose pack since topicals aren't working and we want to stay away from aleve and ibuprofen. You can follow up with me in a no charge visit to go over results or I can call you, whichever you prefer.

## 2020-08-19 ENCOUNTER — Encounter: Payer: Self-pay | Admitting: Family Medicine

## 2020-08-19 NOTE — Progress Notes (Signed)
PCP: Jac Canavan, PA-C  Subjective:   HPI: Patient is a 65 y.o. male here for right shoulder pain.  Patient reports back in March or April while working he was repetitively pulling an appliance to remove it - did not recall a pop or pull at the time. Later that evening he had difficulty raising his right arm with pain and weakness. He went to urgent care, tried a topical cream without much benefit. Had some success with biofreeze but only for 2-3 weeks. + night pain and pain with flexion of shoulder. No bruising or swelling.  Past Medical History:  Diagnosis Date   BPH (benign prostatic hyperplasia)    CHF (congestive heart failure) (HCC)    CVA (cerebral vascular accident) (HCC) 2011   numbness right index finger and right thumb and right thigh   GERD (gastroesophageal reflux disease)    Hyperlipidemia    Hypertension     Current Outpatient Medications on File Prior to Visit  Medication Sig Dispense Refill   ascorbic acid (VITAMIN C) 1000 MG tablet Take by mouth.     aspirin EC 81 MG tablet Take 1 tablet (81 mg total) by mouth daily. Swallow whole. 90 tablet 3   ENTRESTO 97-103 MG TAKE 1 TABLET TWICE A DAY 180 tablet 3   FARXIGA 10 MG TABS tablet TAKE 1 TABLET DAILY BEFORE BREAKFAST 90 tablet 3   nitroGLYCERIN (NITROSTAT) 0.4 MG SL tablet Place 1 tablet (0.4 mg total) under the tongue every 5 (five) minutes as needed for chest pain. If you require more than two tablets five minutes apart go to the nearest ER via EMS. (Patient not taking: Reported on 08/11/2020) 30 tablet 0   simvastatin (ZOCOR) 80 MG tablet TAKE 1 TABLET DAILY 90 tablet 0   No current facility-administered medications on file prior to visit.    Past Surgical History:  Procedure Laterality Date   CYSTOSCOPY WITH INSERTION OF UROLIFT     LASIK     thumb     right, nerve injury repair   TRANSURETHRAL RESECTION OF PROSTATE N/A 12/03/2019   Procedure: TRANSURETHRAL RESECTION OF THE PROSTATE (TURP),  BIPOLAR;  Surgeon: Jannifer Hick, MD;  Location: [facility];  Service: Urology;  Laterality: N/A;  ONLY NEEDS 60 MIN    No Known Allergies  Social History   Socioeconomic History   Marital status: Married    Spouse name: Not on file   Number of children: 3   Years of education: Not on file   Highest education level: Not on file  Occupational History   Not on file  Tobacco Use   Smoking status: Never   Smokeless tobacco: Never  Vaping Use   Vaping Use: Never used  Substance and Sexual Activity   Alcohol use: Never   Drug use: Never   Sexual activity: Not Currently    Birth control/protection: None  Other Topics Concern   Not on file  Social History Narrative   Lives with wife, no children, works in Insurance account manager.  Walks a lot at work.   06/2019   Social Determinants of Health   Financial Resource Strain: Not on file  Food Insecurity: Not on file  Transportation Needs: Not on file  Physical Activity: Not on file  Stress: Not on file  Social Connections: Not on file  Intimate Partner Violence: Not on file    Family History  Problem Relation Age of Onset   Hypertension Mother    Benign prostatic hyperplasia Father  Other Father        died after horrible bed sore and demise   Cancer Sister    Heart disease Neg Hx    Stroke Neg Hx     BP 110/82   Ht 5\' 8"  (1.727 m)   Wt 154 lb (69.9 kg)   BMI 23.42 kg/m   No flowsheet data found.  No flowsheet data found.  Review of Systems: See HPI above.     Objective:  Physical Exam:  Gen: NAD, comfortable in exam room  Right shoulder: No swelling, ecchymoses.  No gross deformity. No TTP AC joint, biceps tendon. Flexion and abduction actively to 110 degrees.  Full passive motion. Positive Hawkins, Neers. Negative Yergasons. Strength 4/5 with empty can and 5/5 resisted internal/external rotation. Negative apprehension. NV intact distally.  Complete MSK u/s right shoulder: Biceps tendon: mild tenosynovitis with  hypoechoic change proximal tendon Pec major tendon: intact Subscapularis: intact but with very mild overlying bursitis under coracoid AC joint: Mild arthropathy and effusion Infraspinatus: intact Supraspinatus: cortical irregularity bursal side with apparent supraspinatus partial thickness bursal sided tear, possible full thickness component Posterior glenohumeral joint: no effusion  Impression: Bursal sided partial thickness supraspinatus tear with possible full thickness component.      Assessment & Plan:  1. Right shoulder pain - concern for full thickness component of supraspinatus tear.  Will obtain MRI to further assess.  Tylenol, icing.  History of CVA, CHF - will avoid nsaids and topicals are not helping much.  He will try prednisone dose pack.

## 2020-08-27 ENCOUNTER — Other Ambulatory Visit: Payer: Self-pay

## 2020-08-27 ENCOUNTER — Ambulatory Visit
Admission: RE | Admit: 2020-08-27 | Discharge: 2020-08-27 | Disposition: A | Discharge status: Home / Self Care | Payer: BC Managed Care – PPO | Source: Ambulatory Visit | Attending: Family Medicine | Admitting: Family Medicine

## 2020-08-27 DIAGNOSIS — M25511 Pain in right shoulder: Secondary | ICD-10-CM

## 2020-08-28 NOTE — Progress Notes (Signed)
Appt with Dr. Everardo Pacific for right shoulder rotator cuff tear.  [facility] [address], [place] [phone]  Appt: 09/05/20 @ 9:30 am.

## 2020-09-23 ENCOUNTER — Other Ambulatory Visit: Payer: Self-pay

## 2020-09-23 ENCOUNTER — Ambulatory Visit: Payer: BC Managed Care – PPO | Admitting: Medical

## 2020-09-23 VITALS — BP 120/68 | HR 74 | Temp 97.9°F | Wt 158.6 lb

## 2020-09-23 DIAGNOSIS — H019 Unspecified inflammation of eyelid: Secondary | ICD-10-CM | POA: Diagnosis not present

## 2020-09-23 DIAGNOSIS — H01004 Unspecified blepharitis left upper eyelid: Secondary | ICD-10-CM | POA: Diagnosis not present

## 2020-09-23 MED ORDER — MUPIROCIN 2 % EX OINT: 1.0000 "application " | TOPICAL_OINTMENT | Freq: Two times a day (BID) | CUTANEOUS | 0 refills | Status: DC

## 2020-09-23 MED ORDER — SULFAMETHOXAZOLE-TRIMETHOPRIM 800-160 MG PO TABS: 1.0000 | ORAL_TABLET | Freq: Two times a day (BID) | ORAL | 0 refills | Status: DC

## 2020-09-23 NOTE — Progress Notes (Signed)
Subjective:  Adrian Willis is a 65 y.o. male who presents for Chief Complaint  Patient presents with   painful bump on eye    Painful bump on eye. No blurry vision. Tried to pop it two days ago but nothing came out. Been there for a couple months. Will get flu shot at work     Here for swollen painful bump on left upper eyelid.  He noticed a little small bump on his left upper eyelid a few months ago but over the last few days he got aggravated.  He tried to pop it.  Now left eyelid is swollen red and tender.  He denies contact with skin infection.  No injury to the eye.  No vision changes.  No fever no body aches or chills.  No other aggravating or relieving factors.    No other c/o.  The following portions of the patient's history were reviewed and updated as appropriate: allergies, current medications, past family history, past medical history, past social history, past surgical history and problem list.  ROS Otherwise as in subjective above  Objective: BP 120/68   Pulse 74   Temp 97.9 F (36.6 C)   Wt 158 lb 9.6 oz (71.9 kg)   SpO2 98%   BMI 24.12 kg/m   General appearance: alert, no distress, well developed, well nourished HEENT: normocephalic, sclerae anicteric, conjunctiva pink and moist,  Left upper eyelid swollen in general with erythema, central to the left upper eyelid is a raised papular pustular region that is tender    Assessment: Encounter Diagnoses  Name Primary?   Eyelid dermatitis, infectious Yes   Blepharitis of left upper eyelid, unspecified type      Plan: We discussed symptoms and findings.  We also discussed worsening symptoms that would prompt urgent recheck  Blepharitis and infection of left upper eye lid  Begin warm compresses with moist warm cloth over the left eye for 15-20 minutes at least twice daily this week Begin oral antibiotic Bactrim, twice daily for a week Begin topical ointment Mupirocin with a qtip over the bump and surrounding  area, preferably 3 times daily this week I suspect this will gradually improve over the next week If worse swelling, worse redness, worse redness or swelling over entire left orbit, fever, nausea or other worse symptoms, get  rechecked right aware   [name] was seen today for painful bump on eye.  Diagnoses and all orders for this visit:  Eyelid dermatitis, infectious  Blepharitis of left upper eyelid, unspecified type  Other orders -     sulfamethoxazole-trimethoprim (BACTRIM DS) 800-160 MG tablet; Take 1 tablet by mouth 2 (two) times daily. -     mupirocin ointment (BACTROBAN) 2 %; Apply 1 application topically 2 (two) times daily.   Follow up: prn

## 2020-09-23 NOTE — Patient Instructions (Signed)
Blepharitis and infection of left upper eye lid  Begin warm compresses with moist warm cloth over the left eye for 15-20 minutes at least twice daily this week Begin oral antibiotic Bactrim, twice daily for a week Begin topical ointment Mupirocin with a qtip over the bump and surrounding area, preferably 3 times daily this week I suspect this will gradually improve over the next week If worse swelling, worse redness, worse redness or swelling over entire left orbit, fever, nausea or other worse symptoms, get  rechecked right aware

## 2020-10-06 ENCOUNTER — Other Ambulatory Visit: Payer: Self-pay | Admitting: Medical

## 2020-11-20 ENCOUNTER — Ambulatory Visit: Payer: BC Managed Care – PPO | Admitting: Cardiology

## 2020-11-24 ENCOUNTER — Ambulatory Visit: Payer: BC Managed Care – PPO | Admitting: Cardiology

## 2020-11-24 ENCOUNTER — Encounter: Payer: Self-pay | Admitting: Cardiology

## 2020-11-24 ENCOUNTER — Other Ambulatory Visit: Payer: Self-pay

## 2020-11-24 VITALS — BP 112/79 | HR 74 | Resp 16 | Ht 68.0 in | Wt 162.0 lb

## 2020-11-24 DIAGNOSIS — Z8673 Personal history of transient ischemic attack (TIA), and cerebral infarction without residual deficits: Secondary | ICD-10-CM

## 2020-11-24 DIAGNOSIS — I10 Essential (primary) hypertension: Secondary | ICD-10-CM

## 2020-11-24 DIAGNOSIS — I5020 Unspecified systolic (congestive) heart failure: Secondary | ICD-10-CM

## 2020-11-24 DIAGNOSIS — E782 Mixed hyperlipidemia: Secondary | ICD-10-CM

## 2020-11-24 NOTE — Progress Notes (Signed)
Date:  11/24/2020   ID:  Adrian Willis, DOB Feb 03, 1955, MRN [id number]  PCP:  Carlena Hurl, PA-C  Cardiologist:  Rex Kras, DO, [facility] (established care 06/20/2019)  Date: 11/24/20 Last Office Visit: 05/20/2020   Chief Complaint  Patient presents with    heart failure management   Follow-up    HPI  Adrian Willis is a 65 y.o. male who presents to the office with a chief complaint of "37-month follow-up for management of congestive heart failure." Patient's past medical history and cardiovascular risk factors include history of stroke, hypertension, hyperlipidemia, and heart failure with reduced EF, stage B, NYHA class II, advanced age.     Initially referred to the office back in June 2021 for evaluation of palpitations.  He underwent a monitor which noted sinus tachycardia without any significant dysrhythmias.  He was started on beta-blocker therapy and his symptoms improved.  Thereafter underwent an echocardiogram which noted moderately reduced LVEF and given his symptoms of effort related dyspnea underwent an ischemic evaluation.  Patient is nuclear stress test was overall reported to be low risk.  However, upon independent review there appeared to be a small size, mild intensity perfusion defect in the RCA distribution.  The shared decision was to continue medical therapy.  Patient now presents for 55-month follow-up.  No chest pain or anginal discomfort since last office visit.  No use of sublingual nitroglycerin tablets.  Patient states that he feels great from a cardiovascular standpoint and has good functional capacity.  He denies orthopnea, paroxysmal nocturnal dyspnea or lower extremity swelling.  Patient has gained approximately 4 pounds last visit due to dietary indiscretion.  FUNCTIONAL STATUS: Walks a lot at work; however, no exercise program or daily routine.    ALLERGIES: No Known Allergies  MEDICATION LIST PRIOR TO VISIT: Current Meds  Medication Sig   ascorbic  acid (VITAMIN C) 1000 MG tablet Take by mouth.   aspirin EC 81 MG tablet Take 1 tablet (81 mg total) by mouth daily. Swallow whole.   ENTRESTO 97-103 MG TAKE 1 TABLET TWICE A DAY   FARXIGA 10 MG TABS tablet TAKE 1 TABLET DAILY BEFORE BREAKFAST   nitroGLYCERIN (NITROSTAT) 0.4 MG SL tablet Place 1 tablet (0.4 mg total) under the tongue every 5 (five) minutes as needed for chest pain. If you require more than two tablets five minutes apart go to the nearest ER via EMS.   simvastatin (ZOCOR) 80 MG tablet TAKE 1 TABLET DAILY     PAST MEDICAL HISTORY: Past Medical History:  Diagnosis Date   BPH (benign prostatic hyperplasia)    CHF (congestive heart failure) (HCC)    CVA (cerebral vascular accident) ([place]) 2011   numbness right index finger and right thumb and right thigh   GERD (gastroesophageal reflux disease)    Hyperlipidemia    Hypertension     PAST SURGICAL HISTORY: Past Surgical History:  Procedure Laterality Date   CYSTOSCOPY WITH INSERTION OF UROLIFT     LASIK     thumb     right, nerve injury repair   TRANSURETHRAL RESECTION OF PROSTATE N/A 12/03/2019   Procedure: TRANSURETHRAL RESECTION OF THE PROSTATE (TURP), BIPOLAR;  Surgeon: Janith Lima, MD;  Location: [facility] ORS;  Service: Urology;  Laterality: N/A;  ONLY NEEDS 60 MIN    FAMILY HISTORY: The patient family history includes Benign prostatic hyperplasia in his father; Cancer in his sister; Hypertension in his mother; Other in his father.  SOCIAL HISTORY:  The patient  reports  that he has never smoked. He has never used smokeless tobacco. He reports that he does not drink alcohol and does not use drugs.  REVIEW OF SYSTEMS: Review of Systems  Constitutional: Negative for chills and fever.  HENT:  Negative for hoarse voice and nosebleeds.   Eyes:  Negative for discharge, double vision and pain.  Cardiovascular:  Negative for chest pain, claudication, dyspnea on exertion, leg swelling, near-syncope, orthopnea, palpitations,  paroxysmal nocturnal dyspnea and syncope.  Respiratory:  Negative for hemoptysis and shortness of breath.   Musculoskeletal:  Negative for muscle cramps and myalgias.  Gastrointestinal:  Negative for abdominal pain, constipation, diarrhea, hematemesis, hematochezia, melena, nausea and vomiting.  Neurological:  Negative for dizziness and light-headedness.   PHYSICAL EXAM: Vitals with BMI 11/24/2020 09/23/2020 08/18/2020  Height 5\' 8"  - 5\' 8"   Weight 162 lbs 158 lbs 10 oz 154 lbs  BMI 24.64 - 23.42  Systolic 112 120  Diastolic 79 68 82  Pulse 74 74 -   CONSTITUTIONAL: Well-developed and well-nourished. No acute distress.  SKIN: Skin is warm and dry. No rash noted. No cyanosis. No pallor. No jaundice HEAD: Normocephalic and atraumatic.  EYES: No scleral icterus MOUTH/THROAT: Moist oral membranes.  NECK: No JVD present. No thyromegaly noted. No carotid bruits  LYMPHATIC: No visible cervical adenopathy.  CHEST Normal respiratory effort. No intercostal retractions  LUNGS: Clear to auscultation bilaterally.  no stridor. No wheezes. No rales.  CARDIOVASCULAR: Regular rate and rhythm, positive S1-S2, no murmurs rubs or gallops appreciated ABDOMINAL: Soft, nontender, nondistended, positive bowel in all 4 quadrants.  No apparent ascites.  EXTREMITIES: No peripheral edema, warm to touch.  HEMATOLOGIC: No significant bruising NEUROLOGIC: Oriented to person, place, and time. Nonfocal. Normal muscle tone.  PSYCHIATRIC: Normal mood and affect. Normal behavior. Cooperative  CARDIAC DATABASE: EKG: 11/24/2020: Sinus  Rhythm, 70bpm, normal axis, without underlying injury pattern.  Echocardiogram: 06/27/2019: Moderate global hypokinesis. LVEF 35-40%.  Doppler evidence of grade I (impaired) diastolic dysfunction, normal LAP. Mild TR.   Stress Testing: Lexiscan Sestamibi Stress Test 08/15/2019:  Nondiagnostic ECG stress.  Myocardial perfusion is normal.  There is a fixed mild defect in the inferior  region suggestive of diaphragmatic attenuation.  Normal LV size. Overall LV systolic function is abnormal without regional wall motion abnormalities. Stress LV EF: 47%.  No previous exam available for comparison. Low risk.  Heart Catheterization: None  Event monitor 06/27/2019 - 07/11/2019: Diagnostic time: 100%  Dominant rhythm: Normal Sinus. HR 51-154 bpm. Avg HR 80 bpm. No atrial fibrillation, high grade AV block, or sinus pause >3sec noted. Heart rate >100bpm for approximately 9% of the monitoring period.  Heart rate <60bpm for <1% of the monitoring period.  Total ventricular ectopy <1%.  No patient triggered events recorded.   Carotid Duplex 06/27/2019: No hemodynamically significant arterial disease in the internal carotid artery bilaterally. Minimal increased intimal thickness bilaterally. Antegrade right vertebral artery flow. Antegrade left vertebral artery flow.  LABORATORY DATA: CBC Latest Ref Rng & Units 12/04/2019 12/03/2019 11/29/2019  WBC 4.0 - 10.5 K/uL 8.3 7.4 3.3(L)  Hemoglobin 13.0 - 17.0 g/dL 12.8(L) 13.2 14.2  Hematocrit 39.0 - 52.0 % 41.0 41.8 44.8  Platelets 150 - 400 K/uL 179 171 179    CMP Latest Ref Rng & Units 01/30/2020 01/01/2020 12/04/2019  Glucose 65 - 99 mg/dL 88 74 01/03/2020)  BUN 8 - 27 mg/dL 9 12 8   Creatinine 0.76 - 1.27 mg/dL 12/06/2019 324(M  Sodium 134 - 144 mmol/L 141 140 134(L)  Potassium 3.5 - 5.2 mmol/L 4.4 4.4 4.6  Chloride 96 - 106 mmol/L 102 100 104  CO2 20 - 29 mmol/L 25 29 21(L)  Calcium 8.6 - 10.2 mg/dL 9.3 9.3 8.0(L)  Total Protein 6.0 - 8.5 g/dL - - -  Total Bilirubin 0.0 - 1.2 mg/dL - - -  Alkaline Phos 39 - 117 IU/L - - -  AST 0 - 40 IU/L - - -  ALT 0 - 44 IU/L - - -    Lipid Panel     Component Value Date/Time   CHOL 144 07/09/2019 1300   TRIG 46 07/09/2019 1300   HDL 62 07/09/2019 1300   CHOLHDL 2.3 07/09/2019 1300   LDLCALC 72 07/09/2019 1300   LABVLDL 10 07/09/2019 1300    No results found for: HGBA1C No  components found for: NTPROBNP Lab Results  Component Value Date   TSH 0.576 05/24/2019    BMP Recent Labs    12/04/19 0540 01/01/20 1253 01/30/20 1220  NA 134* 140 141  K 4.6 4.4 4.4  CL 104 100 102  CO2 21* 29 25  GLUCOSE 195* 74 88  BUN 8 12 9   CREATININE 0.97 1.15 1.02  CALCIUM 8.0* 9.3 9.3  GFRNONAA >60 66 77  GFRAA  --  77 89   BNP (last 3 results) Recent Labs    01/01/20 1253 01/30/20 1220  PROBNP 21 20    TSH No results for input(s): TSH in the last 8760 hours.   CHOLESTEROL No results for input(s): CHOL in the last 8760 hours.   Hepatic Function Panel No results for input(s): PROT, ALBUMIN, AST, ALT, ALKPHOS, BILITOT, BILIDIR, IBILI in the last 8760 hours.   IMPRESSION:    ICD-10-CM   1. HFrEF (heart failure with reduced ejection fraction) (HCC)  I50.20 EKG 12-Lead    PCV ECHOCARDIOGRAM COMPLETE    2. Essential hypertension  I10     3. Mixed hyperlipidemia  E78.2     4. History of stroke  Z86.73        RECOMMENDATIONS: Halston Parro is a 65 y.o. male whose past medical history and cardiac risk factors include:  history of stroke, hypertension, hyperlipidemia, and and heart failure with reduced EF, stage B, NYHA class II, advanced age.    HFrEF (heart failure with reduced ejection fraction) (HCC) Clinically euvolemic. Overall functional capacity remains stable. Medications reconciled. Follow-up echocardiogram in December 2022 to reevaluate LVEF.  If the patient's LVEF has improved back to baseline we will continue current therapy; however, otherwise will uptitrate GDMT.  Patient is agreeable with the plan of care.  Essential hypertension Office blood pressures are very well controlled. Medications reconciled. Overall functional status remains relatively at baseline. Educated on the importance of low-salt diet and 30 minutes of moderate intensity exercise as tolerated 5 days a week.  Mixed hyperlipidemia Currently on statin  therapy. Scheduled for a annual physical with PCP in December 2022. Patient is encouraged to have a copy of his labs forwarded to the office for reference.  History of stroke Continue aspirin and statin therapy. Educated on importance of secondary prevention.   FINAL MEDICATION LIST END OF ENCOUNTER: No orders of the defined types were placed in this encounter.   Medications Discontinued During This Encounter  Medication Reason   mupirocin ointment (BACTROBAN) 2 % Error   sulfamethoxazole-trimethoprim (BACTRIM DS) 800-160 MG tablet Error     Current Outpatient Medications:    ascorbic acid (VITAMIN C) 1000 MG tablet, Take by  mouth., Disp: , Rfl:    aspirin EC 81 MG tablet, Take 1 tablet (81 mg total) by mouth daily. Swallow whole., Disp: 90 tablet, Rfl: 3   ENTRESTO 97-103 MG, TAKE 1 TABLET TWICE A DAY, Disp: 180 tablet, Rfl: 3   FARXIGA 10 MG TABS tablet, TAKE 1 TABLET DAILY BEFORE BREAKFAST, Disp: 90 tablet, Rfl: 3   nitroGLYCERIN (NITROSTAT) 0.4 MG SL tablet, Place 1 tablet (0.4 mg total) under the tongue every 5 (five) minutes as needed for chest pain. If you require more than two tablets five minutes apart go to the nearest ER via EMS., Disp: 30 tablet, Rfl: 0   simvastatin (ZOCOR) 80 MG tablet, TAKE 1 TABLET DAILY, Disp: 90 tablet, Rfl: 0  Orders Placed This Encounter  Procedures   EKG 12-Lead   PCV ECHOCARDIOGRAM COMPLETE   --Continue cardiac medications as reconciled in final medication list. --Return in about 2 months (around 01/24/2021) for Follow up, heart failure management.. Or sooner if needed. --Continue follow-up with your primary care physician regarding the management of your other chronic comorbid conditions.  Patient's questions and concerns were addressed to his satisfaction. He voices understanding of the instructions provided during this encounter.   This note was created using a voice recognition software as a result there may be grammatical errors  inadvertently enclosed that do not reflect the nature of this encounter. Every attempt is made to correct such errors.  Rex Kras, [place], [facility]  Pager: [phone] Office: [phone]

## 2020-12-24 ENCOUNTER — Other Ambulatory Visit: Payer: BC Managed Care – PPO

## 2020-12-25 ENCOUNTER — Other Ambulatory Visit: Payer: BC Managed Care – PPO

## 2021-01-05 ENCOUNTER — Other Ambulatory Visit: Payer: Self-pay

## 2021-01-05 ENCOUNTER — Ambulatory Visit: Payer: BC Managed Care – PPO

## 2021-01-05 ENCOUNTER — Ambulatory Visit: Payer: BC Managed Care – PPO | Admitting: Medical

## 2021-01-05 ENCOUNTER — Other Ambulatory Visit: Payer: Self-pay | Admitting: Medical

## 2021-01-05 VITALS — BP 120/80 | HR 64 | Ht 68.0 in | Wt 161.8 lb

## 2021-01-05 DIAGNOSIS — N401 Enlarged prostate with lower urinary tract symptoms: Secondary | ICD-10-CM

## 2021-01-05 DIAGNOSIS — Z7185 Encounter for immunization safety counseling: Secondary | ICD-10-CM

## 2021-01-05 DIAGNOSIS — I10 Essential (primary) hypertension: Secondary | ICD-10-CM | POA: Diagnosis not present

## 2021-01-05 DIAGNOSIS — E7800 Pure hypercholesterolemia, unspecified: Secondary | ICD-10-CM

## 2021-01-05 DIAGNOSIS — R350 Frequency of micturition: Secondary | ICD-10-CM

## 2021-01-05 DIAGNOSIS — Z0000 Encounter for general adult medical examination without abnormal findings: Secondary | ICD-10-CM

## 2021-01-05 DIAGNOSIS — R21 Rash and other nonspecific skin eruption: Secondary | ICD-10-CM

## 2021-01-05 DIAGNOSIS — N529 Male erectile dysfunction, unspecified: Secondary | ICD-10-CM | POA: Diagnosis not present

## 2021-01-05 DIAGNOSIS — I5020 Unspecified systolic (congestive) heart failure: Secondary | ICD-10-CM | POA: Diagnosis not present

## 2021-01-05 DIAGNOSIS — L989 Disorder of the skin and subcutaneous tissue, unspecified: Secondary | ICD-10-CM

## 2021-01-05 DIAGNOSIS — Z8673 Personal history of transient ischemic attack (TIA), and cerebral infarction without residual deficits: Secondary | ICD-10-CM

## 2021-01-05 MED ORDER — BETAMETHASONE DIPROPIONATE 0.05 % EX CREA: TOPICAL_CREAM | Freq: Every day | CUTANEOUS | 1 refills | Status: DC | PRN

## 2021-01-05 NOTE — Telephone Encounter (Signed)
Pt has an appt today. Will refill after appt

## 2021-01-05 NOTE — Progress Notes (Signed)
Subjective:   HPI  Adrian Willis is a 65 y.o. male who presents for Chief Complaint  Patient presents with   fasting cpe    Fasting cpe, a week ago had a pulling sensation in the side of his right hand    Patient Care Team: Kambria Grima, Kermit Balo, PA-C as PCP - General (Family Medicine) Sees dentist Dr. Tessa Lerner, cardiology Dr. Norton Blizzard, sports medicine Dr. Jettie Pagan, [facility] Dr. Otho Bellows, eye doctor   Concerns: He has a longstanding lesion of his right palm, been there since he was 65 years old after a thorn stuck in his hand pulling weeds.  Would like this removed  A few days ago he felt a spasm or pulling of the muscle of his right hand along the lateral surface.  This happened 2 different times.  No pain no numbness or tingling.  No recent injury trauma or fall.  He has had vaccines at work, works at [employer].   Reviewed their medical, surgical, family, social, medication, and allergy history and updated chart as appropriate.  Past Medical History:  Diagnosis Date   BPH (benign prostatic hyperplasia)    CHF (congestive heart failure) (HCC)    CVA (cerebral vascular accident) (HCC) 2011   numbness right index finger and right thumb and right thigh   GERD (gastroesophageal reflux disease)    Hyperlipidemia    Hypertension     Past Surgical History:  Procedure Laterality Date   CYSTOSCOPY WITH INSERTION OF UROLIFT     LASIK     thumb     right, nerve injury repair   TRANSURETHRAL RESECTION OF PROSTATE N/A 12/03/2019   Procedure: TRANSURETHRAL RESECTION OF THE PROSTATE (TURP), BIPOLAR;  Surgeon: Jannifer Hick, MD;  Location: [facility];  Service: Urology;  Laterality: N/A;  ONLY NEEDS 60 MIN    Family History  Problem Relation Age of Onset   Hypertension Mother    Benign prostatic hyperplasia Father    Other Father        died after horrible bed sore and demise   Cancer Sister    Heart disease Neg Hx    Stroke Neg Hx      Current  Outpatient Medications:    ascorbic acid (VITAMIN C) 1000 MG tablet, Take by mouth., Disp: , Rfl:    aspirin EC 81 MG tablet, Take 1 tablet (81 mg total) by mouth daily. Swallow whole., Disp: 90 tablet, Rfl: 3   betamethasone dipropionate 0.05 % cream, Apply topically daily as needed., Disp: 15 g, Rfl: 1   ENTRESTO 97-103 MG, TAKE 1 TABLET TWICE A DAY, Disp: 180 tablet, Rfl: 3   FARXIGA 10 MG TABS tablet, TAKE 1 TABLET DAILY BEFORE BREAKFAST, Disp: 90 tablet, Rfl: 3   simvastatin (ZOCOR) 80 MG tablet, TAKE 1 TABLET DAILY, Disp: 90 tablet, Rfl: 0   nitroGLYCERIN (NITROSTAT) 0.4 MG SL tablet, Place 1 tablet (0.4 mg total) under the tongue every 5 (five) minutes as needed for chest pain. If you require more than two tablets five minutes apart go to the nearest ER via EMS., Disp: 30 tablet, Rfl: 0  No Known Allergies   Review of Systems Constitutional: -fever, -chills, -sweats, -unexpected weight change, -decreased appetite, -fatigue Allergy: -sneezing, -itching, -congestion Dermatology: -changing moles, +rash, -lumps ENT: -runny nose, -ear pain, -sore throat, -hoarseness, -sinus pain, -teeth pain, - ringing in ears, -hearing loss, -nosebleeds Cardiology: -chest pain, -palpitations, -swelling, -difficulty breathing when lying flat, -waking up short of breath Respiratory: -  cough, -shortness of breath, -difficulty breathing with exercise or exertion, -wheezing, -coughing up blood Gastroenterology: -abdominal pain, -nausea, -vomiting, -diarrhea, -constipation, -blood in stool, -changes in bowel movement, -difficulty swallowing or eating Hematology: -bleeding, -bruising  Musculoskeletal: -joint aches, -muscle aches, -joint swelling, -back pain, -neck pain, +spasm/cramping, -changes in gait Ophthalmology: denies vision changes, eye redness, itching, discharge Urology: -burning with urination, -difficulty urinating, -blood in urine, -urinary frequency, -urgency, -incontinence Neurology: -headache,  -weakness, -tingling, -numbness, -memory loss, -falls, -dizziness Psychology: -depressed mood, -agitation, -sleep problems Male GU: no testicular mass, pain, no lymph nodes swollen, no swelling, no rash.  Depression screen [facility] 2/9 01/05/2021 09/23/2020 07/09/2019  Decreased Interest 0 0 0  Down, Depressed, Hopeless 0 0 0  PHQ - 2 Score 0 0 0        Objective:  BP 120/80    Pulse 64    Ht 5\' 8"  (1.727 m)    Wt 161 lb 12.8 oz (73.4 kg)    BMI 24.60 kg/m   General appearance: alert, no distress, WD/WN, American male Skin: right volar hand centrally with 77mm diameter verruca appearing raised lesion, otherwise no rash.  Few scattered macules throughout HEENT: normocephalic, conjunctiva/corneas normal, sclerae anicteric, PERRLA, EOMi Neck: supple, no lymphadenopathy, no thyromegaly, no masses, normal ROM, no bruits Chest: non tender, normal shape and expansion Heart: RRR, normal S1, S2, no murmurs Lungs: CTA bilaterally, no wheezes, rhonchi, or rales Abdomen: +bs, soft, non tender, non distended, no masses, no hepatomegaly, no splenomegaly, no bruits Back: non tender, normal ROM, no scoliosis Musculoskeletal: upper extremities non tender, no obvious deformity, normal ROM throughout, lower extremities non tender, no obvious deformity, normal ROM throughout Extremities: no edema, no cyanosis, no clubbing Pulses: 2+ symmetric, upper and lower extremities, normal cap refill Neurological: alert, oriented x 3, CN2-12 intact, strength normal upper extremities and lower extremities, sensation normal throughout, DTRs 2+ throughout, no cerebellar signs, gait normal Psychiatric: normal affect, behavior normal, pleasant  GU: normal male external genitalia,circumcised, nontender, no masses, no hernia, no lymphadenopathy Rectal: deferred to urology   Assessment and Plan :   Encounter Diagnoses  Name Primary?   Encounter for health maintenance examination in adult Yes   Erectile dysfunction,  unspecified erectile dysfunction type    Essential hypertension, benign    HFrEF (heart failure with reduced ejection fraction) (HCC)    History of stroke    Pure hypercholesterolemia    Vaccine counseling    Benign prostatic hyperplasia with urinary frequency    Skin lesion     This visit was a preventative care visit, also known as wellness visit or routine physical.   Topics typically include healthy lifestyle, diet, exercise, preventative care, vaccinations, sick and well care, proper use of emergency dept and after hours care, as well as other concerns.     Recommendations: Continue to return yearly for your annual wellness and preventative care visits.  This gives 11m a chance to discuss healthy lifestyle, exercise, vaccinations, review your chart record, and perform screenings where appropriate.  I recommend you establish with an eye doctor and see them yearly for routine follow up.  I recommend you see your dentist yearly for routine dental care including hygiene visits twice yearly.   Vaccination recommendations were reviewed Immunization History  Administered Date(s) Administered   [place] Vaccination 01/24/2019, 02/21/2019, 11/22/2019   Pneumococcal Conjugate-13 07/09/2019   Zoster Recombinat (Shingrix) 08/01/2019, 10/03/2019   We will request vaccine records from employee health where he works at [facility]   Screening for cancer: Colon cancer screening: I reviewed Cologuard negative screen from 2021  We discussed PSA, prostate exam, and prostate cancer screening risks/benefits.     Skin cancer screening: Check your skin regularly for new changes, growing lesions, or other lesions of concern Come in for evaluation if you have skin lesions of concern.  Lung cancer screening: If you have a greater than 20 pack year history of tobacco use, then you may qualify for lung cancer screening with a chest CT scan.   Please call your insurance company  to inquire about coverage for this test.  We currently don't have screenings for other cancers besides breast, cervical, colon, and lung cancers.  If you have a strong family history of cancer or have other cancer screening concerns, please let me know.    Bone health: Get at least 150 minutes of aerobic exercise weekly Get weight bearing exercise at least once weekly Bone density test:  A bone density test is an imaging test that uses a type of X-ray to measure the amount of calcium and other minerals in your bones. The test may be used to diagnose or screen you for a condition that causes weak or thin bones (osteoporosis), predict your risk for a broken bone (fracture), or determine how well your osteoporosis treatment is working. The bone density test is recommended for females 65 and older, or females or males <65 if certain risk factors such as thyroid disease, long term use of steroids such as for asthma or rheumatological issues, vitamin D deficiency, estrogen deficiency, family history of osteoporosis, self or family history of fragility fracture in first degree relative.    Heart health: Get at least 150 minutes of aerobic exercise weekly Limit alcohol It is important to maintain a healthy blood pressure and healthy cholesterol numbers  Heart disease screening: Screening for heart disease includes screening for blood pressure, fasting lipids, glucose/diabetes screening, BMI height to weight ratio, reviewed of smoking status, physical activity, and diet.    Goals include blood pressure 120/80 or less, maintaining a healthy lipid/cholesterol profile, preventing diabetes or keeping diabetes numbers under good control, not smoking or using tobacco products, exercising most days per week or at least 150 minutes per week of exercise, and eating healthy variety of fruits and vegetables, healthy oils, and avoiding unhealthy food choices like fried food, fast food, high sugar and high  cholesterol foods.    Follow up with cardiology today as planned    Medical care options: I recommend you continue to seek care here first for routine care.  We try really hard to have available appointments Monday through Friday daytime hours for sick visits, acute visits, and physicals.  Urgent care should be used for after hours and weekends for significant issues that cannot wait till the next day.  The emergency department should be used for significant potentially life-threatening emergencies.  The emergency department is expensive, can often have long wait times for less significant concerns, so try to utilize primary care, urgent care, or telemedicine when possible to avoid unnecessary trips to the emergency department.  Virtual visits and telemedicine have been introduced since the pandemic started in 2020, and can be convenient ways to receive medical care.  We offer virtual appointments as well to assist you in a variety of options to seek medical care.   Advanced Directives: I recommend you consider completing a Health Care Power of Attorney and Living Will.   These documents respect your wishes  and help alleviate burdens on your loved ones if you were to become terminally ill or be in a position to need those documents enforced.    You can complete Advanced Directives yourself, have them notarized, then have copies made for our office, for you and for anybody you feel should have them in safe keeping.  Or, you can have an attorney prepare these documents.   If you haven't updated your Last Will and Testament in a while, it may be worthwhile having an attorney prepare these documents together and save on some costs.       Separate significant issues discussed: HTN , Heart failure - continue current therapy and follow up with cardiology  Hypercholesteremia - continue statin, labs today  Skin rash - refilled Betamethasone cream for prn use, advised daily moisturizing lotion  Skin  lesion - discussed skin lesion right hand, gave options for therapy including dermatology referral, cryo or other.  He agreed to cryotherapy.  Cryotherapy -discussed risk and benefits of procedure, and patient gave a consent for cryotherapy of the right volar hand lesion/wart.  Use cryotherapy to the right hand lesion as per manufacture guidelines.  Discussed wound care.  Follow-up PRN.   [name] was seen today for fasting cpe.  Diagnoses and all orders for this visit:  Encounter for health maintenance examination in adult -     Comprehensive metabolic panel -     CBC -     Lipid panel -     Urinalysis  Erectile dysfunction, unspecified erectile dysfunction type  Essential hypertension, benign  HFrEF (heart failure with reduced ejection fraction) (HCC)  History of stroke  Pure hypercholesterolemia -     Comprehensive metabolic panel -     Lipid panel  Vaccine counseling  Benign prostatic hyperplasia with urinary frequency  Skin lesion  Other orders -     betamethasone dipropionate 0.05 % cream; Apply topically daily as needed.   Follow-up pending labs, yearly for physical

## 2021-01-06 ENCOUNTER — Other Ambulatory Visit: Payer: Self-pay | Admitting: Medical

## 2021-01-06 DIAGNOSIS — Z8673 Personal history of transient ischemic attack (TIA), and cerebral infarction without residual deficits: Secondary | ICD-10-CM

## 2021-01-06 LAB — COMPREHENSIVE METABOLIC PANEL
ALT: 23 IU/L (ref 0–44)
AST: 24 IU/L (ref 0–40)
Albumin/Globulin Ratio: 1.5 (ref 1.2–2.2)
Albumin: 4.3 g/dL (ref 3.8–4.8)
Alkaline Phosphatase: 62 IU/L (ref 44–121)
BUN/Creatinine Ratio: 9 — ABNORMAL LOW (ref 10–24)
BUN: 9 mg/dL (ref 8–27)
Bilirubin Total: 0.4 mg/dL (ref 0.0–1.2)
CO2: 22 mmol/L (ref 20–29)
Calcium: 9.3 mg/dL (ref 8.6–10.2)
Chloride: 101 mmol/L (ref 96–106)
Creatinine, Ser: 1.05 mg/dL (ref 0.76–1.27)
Globulin, Total: 2.9 g/dL (ref 1.5–4.5)
Glucose: 86 mg/dL (ref 70–99)
Potassium: 4.8 mmol/L (ref 3.5–5.2)
Sodium: 141 mmol/L (ref 134–144)
Total Protein: 7.2 g/dL (ref 6.0–8.5)
eGFR: 78 mL/min/{1.73_m2} (ref >59)

## 2021-01-06 LAB — LIPID PANEL
Chol/HDL Ratio: 2.2 ratio (ref 0.0–5.0)
Cholesterol, Total: 136 mg/dL (ref 100–199)
HDL: 62 mg/dL (ref >39)
LDL Chol Calc (NIH): 59 mg/dL (ref 0–99)
Triglycerides: 74 mg/dL (ref 0–149)
VLDL Cholesterol Cal: 15 mg/dL (ref 5–40)

## 2021-01-06 LAB — CBC
Hematocrit: 49.6 % (ref 37.5–51.0)
Hemoglobin: 15.7 g/dL (ref 13.0–17.7)
MCH: 27.6 pg (ref 26.6–33.0)
MCHC: 31.7 g/dL (ref 31.5–35.7)
MCV: 87 fL (ref 79–97)
Platelets: 200 10*3/uL (ref 150–450)
RBC: 5.69 x10E6/uL (ref 4.14–5.80)
RDW: 13.3 % (ref 11.6–15.4)
WBC: 4.6 10*3/uL (ref 3.4–10.8)

## 2021-01-06 MED ORDER — SIMVASTATIN 80 MG PO TABS: 80.0000 mg | ORAL_TABLET | Freq: Every day | ORAL | 3 refills | Status: DC

## 2021-01-06 MED ORDER — ASPIRIN EC 81 MG PO TBEC: 81.0000 mg | DELAYED_RELEASE_TABLET | Freq: Every day | ORAL | 3 refills | Status: DC

## 2021-01-06 NOTE — Patient Instructions (Signed)
This visit was a preventative care visit, also known as wellness visit or routine physical.   Topics typically include healthy lifestyle, diet, exercise, preventative care, vaccinations, sick and well care, proper use of emergency dept and after hours care, as well as other concerns.       Recommendations: Continue to return yearly for your annual wellness and preventative care visits.  This gives [place] a chance to discuss healthy lifestyle, exercise, vaccinations, review your chart record, and perform screenings where appropriate.   I recommend you establish with an eye doctor and see them yearly for routine follow up.   I recommend you see your dentist yearly for routine dental care including hygiene visits twice yearly.     Vaccination recommendations were reviewed     Immunization History  Administered Date(s) Administered   [employer] Vaccination 01/24/2019, 02/21/2019, 11/22/2019   Pneumococcal Conjugate-13 07/09/2019   Zoster Recombinat (Shingrix) 08/01/2019, 10/03/2019    We will request vaccine records from employee health where he works at [facility] [employer]     Screening for cancer: Colon cancer screening: I reviewed Cologuard negative screen from 2021   We discussed PSA, prostate exam, and prostate cancer screening risks/benefits.      Skin cancer screening: Check your skin regularly for new changes, growing lesions, or other lesions of concern Come in for evaluation if you have skin lesions of concern.   Lung cancer screening: If you have a greater than 20 pack year history of tobacco use, then you may qualify for lung cancer screening with a chest CT scan.   Please call your insurance company to inquire about coverage for this test.   We currently don't have screenings for other cancers besides breast, cervical, colon, and lung cancers.  If you have a strong family history of cancer or have other cancer screening concerns, please let me know.      Bone  health: Get at least 150 minutes of aerobic exercise weekly Get weight bearing exercise at least once weekly Bone density test:  A bone density test is an imaging test that uses a type of X-ray to measure the amount of calcium and other minerals in your bones. The test may be used to diagnose or screen you for a condition that causes weak or thin bones (osteoporosis), predict your risk for a broken bone (fracture), or determine how well your osteoporosis treatment is working. The bone density test is recommended for females 65 and older, or females or males <65 if certain risk factors such as thyroid disease, long term use of steroids such as for asthma or rheumatological issues, vitamin D deficiency, estrogen deficiency, family history of osteoporosis, self or family history of fragility fracture in first degree relative.       Heart health: Get at least 150 minutes of aerobic exercise weekly Limit alcohol It is important to maintain a healthy blood pressure and healthy cholesterol numbers   Heart disease screening: Screening for heart disease includes screening for blood pressure, fasting lipids, glucose/diabetes screening, BMI height to weight ratio, reviewed of smoking status, physical activity, and diet.     Goals include blood pressure 120/80 or less, maintaining a healthy lipid/cholesterol profile, preventing diabetes or keeping diabetes numbers under good control, not smoking or using tobacco products, exercising most days per week or at least 150 minutes per week of exercise, and eating healthy variety of fruits and vegetables, healthy oils, and avoiding unhealthy food choices like fried food, fast food, high sugar and  high cholesterol foods.     Follow up with cardiology today as planned       Medical care options: I recommend you continue to seek care here first for routine care.  We try really hard to have available appointments Monday through Friday daytime hours for sick  visits, acute visits, and physicals.  Urgent care should be used for after hours and weekends for significant issues that cannot wait till the next day.  The emergency department should be used for significant potentially life-threatening emergencies.  The emergency department is expensive, can often have long wait times for less significant concerns, so try to utilize primary care, urgent care, or telemedicine when possible to avoid unnecessary trips to the emergency department.  Virtual visits and telemedicine have been introduced since the pandemic started in 2020, and can be convenient ways to receive medical care.  We offer virtual appointments as well to assist you in a variety of options to seek medical care.     Advanced Directives: I recommend you consider completing a Health Care Power of Attorney and Living Will.   These documents respect your wishes and help alleviate burdens on your loved ones if you were to become terminally ill or be in a position to need those documents enforced.     You can complete Advanced Directives yourself, have them notarized, then have copies made for our office, for you and for anybody you feel should have them in safe keeping.   Or, you can have an attorney prepare these documents.   If you haven't updated your Last Will and Testament in a while, it may be worthwhile having an attorney prepare these documents together and save on some costs.         Separate significant issues discussed: HTN , Heart failure - continue current therapy and follow up with cardiology   Hypercholesteremia - continue statin, labs today   Skin rash - refilled Betamethasone cream for prn use, advised daily moisturizing lotion   Skin lesion - discussed skin lesion right hand, gave options for therapy including dermatology referral, cryo or other.  He agreed to cryotherapy.  Cryotherapy -discussed risk and benefits of procedure, and patient gave a consent for cryotherapy of the  right volar hand lesion/wart.  Use cryotherapy to the right hand lesion as per manufacture guidelines.  Discussed wound care.  Follow-up PRN.

## 2021-01-20 NOTE — Progress Notes (Signed)
Attempted to call pt, no answer. Left vm requesting call back.

## 2021-01-21 NOTE — Progress Notes (Signed)
Pt called back. Pt is aware.

## 2021-01-27 ENCOUNTER — Ambulatory Visit: Payer: BC Managed Care – PPO | Admitting: Cardiology

## 2021-01-27 ENCOUNTER — Encounter: Payer: Self-pay | Admitting: Cardiology

## 2021-01-27 ENCOUNTER — Other Ambulatory Visit: Payer: Self-pay

## 2021-01-27 ENCOUNTER — Telehealth: Payer: Self-pay | Admitting: Cardiology

## 2021-01-27 VITALS — BP 117/84 | HR 71 | Temp 98.0°F | Resp 16 | Ht 68.0 in | Wt 168.0 lb

## 2021-01-27 DIAGNOSIS — E782 Mixed hyperlipidemia: Secondary | ICD-10-CM

## 2021-01-27 DIAGNOSIS — Z01810 Encounter for preprocedural cardiovascular examination: Secondary | ICD-10-CM

## 2021-01-27 DIAGNOSIS — Z8673 Personal history of transient ischemic attack (TIA), and cerebral infarction without residual deficits: Secondary | ICD-10-CM

## 2021-01-27 DIAGNOSIS — I5020 Unspecified systolic (congestive) heart failure: Secondary | ICD-10-CM

## 2021-01-27 DIAGNOSIS — I10 Essential (primary) hypertension: Secondary | ICD-10-CM

## 2021-01-27 NOTE — Progress Notes (Signed)
Date:  01/27/2021   ID:  Adrian Willis, DOB 1955/05/22, MRN [id number]  PCP:  Carlena Hurl, PA-C  Cardiologist:  Rex Kras, DO, [facility] (established care 06/20/2019)  Date: 01/27/21 Last Office Visit: 11/24/2020   Chief Complaint  Patient presents with    heart failure management   Follow-up   Pre-op Exam    HPI  Adrian Willis is a 66 y.o. male who presents to the office with a chief complaint of "88-month follow-up for management of congestive heart failure and preoperative risk stratification." Patient's past medical history and cardiovascular risk factors include history of stroke 2011, hypertension, hyperlipidemia, and heart failure with reduced EF, stage B, NYHA class II, advanced age.     In June 2021 referred to the office for evaluation of palpitations.  Monitoring noted sinus tachycardia burden without any significant dysrhythmias.  He was started on beta-blocker therapy and his symptoms improved.  He underwent an echocardiogram which noted moderately reduced LVEF and patient was also experiencing dyspnea on exertion.  He subsequently underwent ischemic evaluation and stress test was overall reported to be low risk.  Upon independent review of the MPI appeared to have a small size, mild intensity perfusion defect in the RCA distribution however shared decision was to treat it medically and he has done well.  His last office visit he had a repeat echocardiogram which notes an LVEF of 35-40%, global hypokinesis, no significant valvular heart disease.  Clinically, patient is euvolemic and does not complain of anginal discomfort.  No hospitalizations or urgent care visits for cardiovascular symptoms since last office visit.  Patient is also being scheduled for right shoulder scope and rotator cuff repair, date to be determined, with Dr. Griffin Basil.   FUNCTIONAL STATUS: Regularly does kickboxing, walks a lot at work; however, no exercise program or daily routine.     ALLERGIES: No Known Allergies  MEDICATION LIST PRIOR TO VISIT: Current Meds  Medication Sig   ascorbic acid (VITAMIN C) 1000 MG tablet Take by mouth.   aspirin EC 81 MG tablet Take 1 tablet (81 mg total) by mouth daily. Swallow whole.   betamethasone dipropionate 0.05 % cream Apply topically daily as needed.   ENTRESTO 97-103 MG TAKE 1 TABLET TWICE A DAY   FARXIGA 10 MG TABS tablet TAKE 1 TABLET DAILY BEFORE BREAKFAST   nitroGLYCERIN (NITROSTAT) 0.4 MG SL tablet Place 1 tablet (0.4 mg total) under the tongue every 5 (five) minutes as needed for chest pain. If you require more than two tablets five minutes apart go to the nearest ER via EMS.   simvastatin (ZOCOR) 80 MG tablet Take 1 tablet (80 mg total) by mouth daily.     PAST MEDICAL HISTORY: Past Medical History:  Diagnosis Date   BPH (benign prostatic hyperplasia)    CHF (congestive heart failure) ([place])    CVA (cerebral vascular accident) ([place]) 2011   numbness right index finger and right thumb and right thigh   GERD (gastroesophageal reflux disease)    Hyperlipidemia    Hypertension     PAST SURGICAL HISTORY: Past Surgical History:  Procedure Laterality Date   CYSTOSCOPY WITH INSERTION OF UROLIFT     LASIK     thumb     right, nerve injury repair   TRANSURETHRAL RESECTION OF PROSTATE N/A 12/03/2019   Procedure: TRANSURETHRAL RESECTION OF THE PROSTATE (TURP), BIPOLAR;  Surgeon: Janith Lima, MD;  Location: [facility];  Service: Urology;  Laterality: N/A;  ONLY NEEDS 60 MIN  FAMILY HISTORY: The patient family history includes Benign prostatic hyperplasia in his father; Cancer in his sister; Hypertension in his mother; Other in his father.  SOCIAL HISTORY:  The patient  reports that he has never smoked. He has never used smokeless tobacco. He reports that he does not drink alcohol and does not use drugs.  REVIEW OF SYSTEMS: Review of Systems  Constitutional: Negative for chills and fever.  HENT:  Negative for  hoarse voice and nosebleeds.   Eyes:  Negative for discharge, double vision and pain.  Cardiovascular:  Negative for chest pain, claudication, dyspnea on exertion, leg swelling, near-syncope, orthopnea, palpitations, paroxysmal nocturnal dyspnea and syncope.  Respiratory:  Negative for hemoptysis and shortness of breath.   Musculoskeletal:  Negative for muscle cramps and myalgias.  Gastrointestinal:  Negative for abdominal pain, constipation, diarrhea, hematemesis, hematochezia, melena, nausea and vomiting.  Neurological:  Negative for dizziness and light-headedness.   PHYSICAL EXAM: Vitals with BMI 01/27/2021 01/05/2021 11/24/2020  Height 5\' 8"  5\' 8"  5\' 8"   Weight 168 lbs 161 lbs 13 oz 162 lbs  BMI 25.55 [id number]  Systolic [id number]  Diastolic 84 80 79  Pulse 71 64 74   CONSTITUTIONAL: Well-developed and well-nourished. No acute distress.  SKIN: Skin is warm and dry. No rash noted. No cyanosis. No pallor. No jaundice HEAD: Normocephalic and atraumatic.  EYES: No scleral icterus MOUTH/THROAT: Moist oral membranes.  NECK: No JVD present. No thyromegaly noted. No carotid bruits  LYMPHATIC: No visible cervical adenopathy.  CHEST Normal respiratory effort. No intercostal retractions  LUNGS: Clear to auscultation bilaterally.  no stridor. No wheezes. No rales.  CARDIOVASCULAR: Regular rate and rhythm, positive S1-S2, no murmurs rubs or gallops appreciated ABDOMINAL: Soft, nontender, nondistended, positive bowel in all 4 quadrants.  No apparent ascites.  EXTREMITIES: No peripheral edema, warm to touch.  HEMATOLOGIC: No significant bruising NEUROLOGIC: Oriented to person, place, and time. Nonfocal. Normal muscle tone.  PSYCHIATRIC: Normal mood and affect. Normal behavior. Cooperative  CARDIAC DATABASE: EKG: 01/27/2021: NSR, 67 bpm, normal axis, without underlying ischemia or injury pattern.  Echocardiogram: 06/27/2019: Moderate global hypokinesis. LVEF 35-40%.  Grade I (impaired)  diastolic dysfunction, normal LAP. Mild TR.   01/05/2021:  Left ventricle cavity is normal in size. Normal left ventricular wall thickness. Normal diastolic filling pattern. Hypokinetic global wall motion. Moderately depressed LV systolic function with visual EF 35-40%. Left atrial cavity is mildly dilated. Structurally normal trileaflet aortic valve. Trace aortic regurgitation. Structurally normal tricuspid valve. No evidence of tricuspid stenosis. Mild tricuspid regurgitation. No significant change from 06/27/2019. (On personal review grade 1 diastolic impairment).   Stress Testing: Lexiscan Sestamibi Stress Test 08/15/2019:  Nondiagnostic ECG stress.  Myocardial perfusion is normal.  There is a fixed mild defect in the inferior region suggestive of diaphragmatic attenuation.  Normal LV size. Overall LV systolic function is abnormal without regional wall motion abnormalities. Stress LV EF: 47%.  No previous exam available for comparison. Low risk.  Heart Catheterization: None  Event monitor 06/27/2019 - 07/11/2019: Diagnostic time: 100%  Dominant rhythm: Normal Sinus. HR 51-154 bpm. Avg HR 80 bpm. No atrial fibrillation, high grade AV block, or sinus pause >3sec noted. Heart rate >100bpm for approximately 9% of the monitoring period.  Heart rate <60bpm for <1% of the monitoring period.  Total ventricular ectopy <1%.  No patient triggered events recorded.   Carotid Duplex 06/27/2019: No hemodynamically significant arterial disease in the internal carotid artery bilaterally. Minimal increased intimal thickness bilaterally. Antegrade right vertebral  artery flow. Antegrade left vertebral artery flow.  LABORATORY DATA: CBC Latest Ref Rng & Units 01/05/2021 12/04/2019 12/03/2019  WBC 3.4 - 10.8 x10E3/uL 4.6 8.3 7.4  Hemoglobin 13.0 - 17.7 g/dL 15.7 12.8(L) 13.2  Hematocrit 37.5 - 51.0 % 49.6 41.0 41.8  Platelets 150 - 450 x10E3/uL 200 179 171    CMP Latest Ref Rng & Units  01/05/2021 01/30/2020 01/01/2020  Glucose 70 - 99 mg/dL 86 88 74  BUN 8 - 27 mg/dL 9 9 12   Creatinine 0.76 - 1.27 mg/dL 1.05 1.02 1.15  Sodium 134 - 144 mmol/L 141 141 140  Potassium 3.5 - 5.2 mmol/L 4.8 4.4 4.4  Chloride 96 - 106 mmol/L 101 102 100  CO2 20 - 29 mmol/L 22 25 29   Calcium 8.6 - 10.2 mg/dL 9.3 9.3 9.3  Total Protein 6.0 - 8.5 g/dL 7.2 - -  Total Bilirubin 0.0 - 1.2 mg/dL 0.4 - -  Alkaline Phos 44 - 121 IU/L 62 - -  AST 0 - 40 IU/L 24 - -  ALT 0 - 44 IU/L 23 - -    Lipid Panel     Component Value Date/Time   CHOL 136 01/05/2021 1108   TRIG 74 01/05/2021 1108   HDL 62 01/05/2021 1108   CHOLHDL 2.2 01/05/2021 1108   LDLCALC 59 01/05/2021 1108   LABVLDL 15 01/05/2021 1108    No results found for: HGBA1C No components found for: NTPROBNP Lab Results  Component Value Date   TSH 0.576 05/24/2019    BMP Recent Labs    01/30/20 1220 01/05/21 1108  NA 141 141  K 4.4 4.8  CL 102 101  CO2 25 22  GLUCOSE 88 86  BUN 9 9  CREATININE 1.02 1.05  CALCIUM 9.3 9.3  GFRNONAA 77  --   GFRAA 89  --    BNP (last 3 results) Recent Labs    01/30/20 1220  PROBNP 20    TSH No results for input(s): TSH in the last 8760 hours.   CHOLESTEROL Recent Labs    01/05/21 1108  CHOL 136     Hepatic Function Panel Recent Labs    01/05/21 1108  PROT 7.2  ALBUMIN 4.3  AST 24  ALT 23  ALKPHOS 27  BILITOT 0.4     IMPRESSION:    ICD-10-CM   1. HFrEF (heart failure with reduced ejection fraction) (HCC)  I50.20     2. Preop cardiovascular exam  Z01.810 EKG 12-Lead    3. Essential hypertension  I10     4. Mixed hyperlipidemia  E78.2     5. History of stroke  Z86.73         RECOMMENDATIONS: Adrian Willis is a 66 y.o. male whose past medical history and cardiac risk factors include:  history of stroke, hypertension, hyperlipidemia, and and heart failure with reduced EF, stage B, NYHA class II, advanced age.    HFrEF (heart failure with reduced  ejection fraction) (HCC) Compensated, stage B, NYHA class II Overall functional capacity remains stable. Medications reconciled. Patient's blood pressure is well controlled, weight relatively stable, the shared decision was to hold off on further up titration of GDMT suggest addition of spironolactone until his surgery is complete.  Preoperative cardiovascular examination: Planning to undergo right shoulder scope and rotator cuff repair, date of surgery to be determined, with [facility].  No active chest pain to suggest ACS, no recent hospitalizations for decompensated heart failure, no known history of dysrhythmias or significant  valvular heart disease.  EKG today shows normal sinus rhythm without underlying ischemia or injury pattern.  He is overall stable from a cardiovascular standpoint; however, does have compensated heart failure with reduced EF, and history of stroke in 2011.  He is low to moderate risk for upcoming noncardiac surgery this is due to nonmodifiable risk factors and comorbid conditions.  Recommend holding aspirin 7 days prior to his upcoming noncardiac surgery.  Recommend holding Farxiga 3 days prior to his upcoming noncardiac surgery.  Essential hypertension Office blood pressures are very well controlled. Medications reconciled. Overall functional status remains relatively at baseline. Educated on the importance of low-salt diet and 30 minutes of moderate intensity exercise as tolerated 5 days a week.  Mixed hyperlipidemia Currently on statin therapy. Most recent lipid profile from December 2022 reviewed, LDL currently at goal.  History of stroke Continue aspirin and statin therapy. Educated on importance of secondary prevention.   FINAL MEDICATION LIST END OF ENCOUNTER: No orders of the defined types were placed in this encounter.    There are no discontinued medications.    Current Outpatient Medications:    ascorbic acid (VITAMIN C) 1000 MG  tablet, Take by mouth., Disp: , Rfl:    aspirin EC 81 MG tablet, Take 1 tablet (81 mg total) by mouth daily. Swallow whole., Disp: 90 tablet, Rfl: 3   betamethasone dipropionate 0.05 % cream, Apply topically daily as needed., Disp: 15 g, Rfl: 1   ENTRESTO 97-103 MG, TAKE 1 TABLET TWICE A DAY, Disp: 180 tablet, Rfl: 3   FARXIGA 10 MG TABS tablet, TAKE 1 TABLET DAILY BEFORE BREAKFAST, Disp: 90 tablet, Rfl: 3   nitroGLYCERIN (NITROSTAT) 0.4 MG SL tablet, Place 1 tablet (0.4 mg total) under the tongue every 5 (five) minutes as needed for chest pain. If you require more than two tablets five minutes apart go to the nearest ER via EMS., Disp: 30 tablet, Rfl: 0   simvastatin (ZOCOR) 80 MG tablet, Take 1 tablet (80 mg total) by mouth daily., Disp: 90 tablet, Rfl: 3  Orders Placed This Encounter  Procedures   EKG 12-Lead   --Continue cardiac medications as reconciled in final medication list. --Return in about 3 months (around 04/27/2021) for heart failure management.. Or sooner if needed. --Continue follow-up with your primary care physician regarding the management of your other chronic comorbid conditions.  Patient's questions and concerns were addressed to his satisfaction. He voices understanding of the instructions provided during this encounter.   This note was created using a voice recognition software as a result there may be grammatical errors inadvertently enclosed that do not reflect the nature of this encounter. Every attempt is made to correct such errors.  Rex Kras, [place], [facility]  Pager: [phone] Office: [phone]

## 2021-01-28 ENCOUNTER — Telehealth: Payer: Self-pay

## 2021-01-29 ENCOUNTER — Telehealth: Payer: Self-pay | Admitting: Medical

## 2021-01-29 NOTE — Telephone Encounter (Signed)
Fax back surgery clearance and send recent labs, recent EKG.

## 2021-01-29 NOTE — Telephone Encounter (Signed)
Okay, thank you!

## 2021-01-29 NOTE — Telephone Encounter (Signed)
Faxed back to [employer]

## 2021-01-30 NOTE — Telephone Encounter (Signed)
In error.   ST 

## 2021-02-18 NOTE — Progress Notes (Signed)
Sent message, via epic in basket, requesting orders in epic from surgeon.  

## 2021-02-23 NOTE — Progress Notes (Addendum)
PCP - Crosby Oyster, PA-C Cardiologist - Tessa Lerner, DO 01-27-21 clearance epic  PPM/ICD -  Device Orders -  Rep Notified -   Chest x-ray -  EKG - 01-27-21 epic Stress Test - 08-19-19  ECHO - 01-12-22 Cardiac Cath -   Sleep Study -  CPAP -   Fasting Blood Sugar -  Checks Blood Sugar _____ times a day  Blood Thinner Instructions: Aspirin Instructions:81 mg asa hold 7 days Hold Farxiga 3 days per MD recommendation  ERAS Protcol - PRE-SURGERY Ensure   COVID TEST- N/A COVID vaccine - fully vaccinated and boosted Moderna  Activity--Able to walk a flight of stairs without SOB Anesthesia review: CHF HTN, CVA 2011  Patient denies shortness of breath, fever, cough and chest pain at PAT appointment   All instructions explained to the patient, with a verbal understanding of the material. Patient agrees to go over the instructions while at home for a better understanding. Patient also instructed to self quarantine after being tested for COVID-19. The opportunity to ask questions was provided.

## 2021-02-23 NOTE — Patient Instructions (Addendum)
DUE TO COVID-19 ONLY ONE VISITOR IS ALLOWED TO COME WITH YOU AND STAY IN THE WAITING ROOM ONLY DURING PRE OP AND PROCEDURE DAY OF SURGERY.            Your procedure is scheduled on: 03-11-21    Report to [facility] Main  Entrance   Report to admitting at      0745  AM     Call this number if you have problems the morning of surgery [phone]   Remember: NO SOLID FOOD AFTER MIDNIGHT THE NIGHT PRIOR TO SURGERY. NOTHING BY MOUTH EXCEPT CLEAR LIQUIDS UNTIL   0700 am .  PLEASE FINISH ENSURE DRINK PER SURGEON ORDER  WHICH NEEDS TO BE COMPLETED AT     0700 am then nothing by mouth .     CLEAR LIQUID DIET                                                                    water Black Coffee and tea, regular and decaf No Creamer                            Plain Jell-O any favor except red or purple                                  Fruit ices (not with fruit pulp)                                      Iced Popsicles                                     Carbonated beverages, regular and diet                                    Cranberry, grape and apple juices Sports drinks like Gatorade Lightly seasoned clear broth or consume(fat free) Sugar, honey syrup   _____________________________________________________________________     BRUSH YOUR TEETH MORNING OF SURGERY AND RINSE YOUR MOUTH OUT, NO CHEWING GUM CANDY OR MINTS.     Take these medicines the morning of surgery with A SIP OF WATER: NONE                                 You may not have any metal on your body including hair pins and              piercings  Do not wear jewelry, lotions, powders,perfumes,        deodorant                       Men may shave face and neck.   Do not bring valuables to the hospital. [place] IS NOT             RESPONSIBLE   FOR  VALUABLES.  Contacts, dentures or bridgework may not be worn into surgery.       Patients discharged the day of surgery will not be allowed to drive home.  IF YOU ARE HAVING SURGERY AND GOING HOME THE SAME DAY, YOU MUST HAVE AN ADULT TO DRIVE YOU HOME AND BE WITH YOU FOR 24 HOURS. YOU MAY GO HOME BY TAXI OR UBER OR ORTHERWISE, BUT AN ADULT MUST ACCOMPANY YOU HOME AND STAY WITH YOU FOR 24 HOURS.  Name and phone number of your driver:  Special Instructions: N/A              Please read over the following fact sheets you were given: _____________________________________________________________________             [facility] - Preparing for Surgery Before surgery, you can play an important role.  Because skin is not sterile, your skin needs to be as free of germs as possible.  You can reduce the number of germs on your skin by washing with CHG (chlorahexidine gluconate) soap before surgery.  CHG is an antiseptic cleaner which kills germs and bonds with the skin to continue killing germs even after washing. Please DO NOT use if you have an allergy to CHG or antibacterial soaps.  If your skin becomes reddened/irritated stop using the CHG and inform your nurse when you arrive at Short Stay. Do not shave (including legs and underarms) for at least 48 hours prior to the first CHG shower.  You may shave your face/neck. Please follow these instructions carefully:  1.  Shower with CHG Soap the night before surgery and the  morning of Surgery.  2.  If you choose to wash your hair, wash your hair first as usual with your  normal  shampoo.  3.  After you shampoo, rinse your hair and body thoroughly to remove the  shampoo.                           4.  Use CHG as you would any other liquid soap.  You can apply chg directly  to the skin and wash                       Gently with a scrungie or clean washcloth.  5.  Apply the CHG Soap to your body ONLY FROM THE NECK DOWN.   Do not use on face/ open                           Wound or open sores. Avoid contact with eyes, ears mouth and genitals (private parts).                       Wash face,  Genitals (private parts)  with your normal soap.             6.  Wash thoroughly, paying special attention to the area where your surgery  will be performed.  7.  Thoroughly rinse your body with warm water from the neck down.  8.  DO NOT shower/wash with your normal soap after using and rinsing off  the CHG Soap.                9.  Pat yourself dry with a clean towel.            10.  Wear clean pajamas.  11.  Place clean sheets on your bed the night of your first shower and do not  sleep with pets. Day of Surgery : Do not apply any lotions/deodorants the morning of surgery.  Please wear clean clothes to the hospital/surgery center.  FAILURE TO FOLLOW THESE INSTRUCTIONS MAY RESULT IN THE CANCELLATION OF YOUR SURGERY PATIENT SIGNATURE_________________________________  NURSE SIGNATURE__________________________________  ________________________________________________________________________    Adrian Willis  An incentive spirometer is a tool that can help keep your lungs clear and active. This tool measures how well you are filling your lungs with each breath. Taking long deep breaths may help reverse or decrease the chance of developing breathing (pulmonary) problems (especially infection) following: A long period of time when you are unable to move or be active. BEFORE THE PROCEDURE  If the spirometer includes an indicator to show your best effort, your nurse or respiratory therapist will set it to a desired goal. If possible, sit up straight or lean slightly forward. Try not to slouch. Hold the incentive spirometer in an upright position. INSTRUCTIONS FOR USE  Sit on the edge of your bed if possible, or sit up as far as you can in bed or on a chair. Hold the incentive spirometer in an upright position. Breathe out normally. Place the mouthpiece in your mouth and seal your lips tightly around it. Breathe in slowly and as deeply as possible, raising the piston or the ball toward the top of the  column. Hold your breath for 3-5 seconds or for as long as possible. Allow the piston or ball to fall to the bottom of the column. Remove the mouthpiece from your mouth and breathe out normally. Rest for a few seconds and repeat Steps 1 through 7 at least 10 times every 1-2 hours when you are awake. Take your time and take a few normal breaths between deep breaths. The spirometer may include an indicator to show your best effort. Use the indicator as a goal to work toward during each repetition. After each set of 10 deep breaths, practice coughing to be sure your lungs are clear. If you have an incision (the cut made at the time of surgery), support your incision when coughing by placing a pillow or rolled up towels firmly against it. Once you are able to get out of bed, walk around indoors and cough well. You may stop using the incentive spirometer when instructed by your caregiver.  RISKS AND COMPLICATIONS Take your time so you do not get dizzy or light-headed. If you are in pain, you may need to take or ask for pain medication before doing incentive spirometry. It is harder to take a deep breath if you are having pain. AFTER USE Rest and breathe slowly and easily. It can be helpful to keep track of a log of your progress. Your caregiver can provide you with a simple table to help with this. If you are using the spirometer at home, follow these instructions: SEEK MEDICAL CARE IF:  You are having difficultly using the spirometer. You have trouble using the spirometer as often as instructed. Your pain medication is not giving enough relief while using the spirometer. You develop fever of 100.5 F (38.1 C) or higher. SEEK IMMEDIATE MEDICAL CARE IF:  You cough up bloody sputum that had not been present before. You develop fever of 102 F (38.9 C) or greater. You develop worsening pain at or near the incision site. MAKE SURE YOU:  Understand these instructions. Will watch your  condition.  Will get help right away if you are not doing well or get worse. Document Released: 05/17/2006 Document Revised: 03/29/2011 Document Reviewed: 07/18/2006 [facility] Patient Information 2014 [place], [place].   ________________________________________________________________________

## 2021-02-27 ENCOUNTER — Other Ambulatory Visit: Payer: Self-pay

## 2021-02-27 ENCOUNTER — Encounter (HOSPITAL_COMMUNITY): Payer: Self-pay

## 2021-02-27 ENCOUNTER — Encounter (HOSPITAL_COMMUNITY)
Admission: RE | Admit: 2021-02-27 | Discharge: 2021-02-27 | Disposition: A | Discharge status: Home / Self Care | Payer: 59 | Source: Ambulatory Visit | Attending: Orthopaedic Surgery | Admitting: Orthopaedic Surgery

## 2021-02-27 VITALS — BP 123/80 | HR 78 | Temp 98.1°F | Resp 16 | Ht 68.0 in | Wt 158.4 lb

## 2021-02-27 DIAGNOSIS — Z01812 Encounter for preprocedural laboratory examination: Secondary | ICD-10-CM | POA: Diagnosis present

## 2021-02-27 DIAGNOSIS — Z8673 Personal history of transient ischemic attack (TIA), and cerebral infarction without residual deficits: Secondary | ICD-10-CM | POA: Insufficient documentation

## 2021-02-27 DIAGNOSIS — M7521 Bicipital tendinitis, right shoulder: Secondary | ICD-10-CM | POA: Insufficient documentation

## 2021-02-27 DIAGNOSIS — Z7984 Long term (current) use of oral hypoglycemic drugs: Secondary | ICD-10-CM | POA: Insufficient documentation

## 2021-02-27 DIAGNOSIS — M7541 Impingement syndrome of right shoulder: Secondary | ICD-10-CM | POA: Insufficient documentation

## 2021-02-27 DIAGNOSIS — I110 Hypertensive heart disease with heart failure: Secondary | ICD-10-CM | POA: Insufficient documentation

## 2021-02-27 DIAGNOSIS — Z7982 Long term (current) use of aspirin: Secondary | ICD-10-CM | POA: Diagnosis not present

## 2021-02-27 DIAGNOSIS — M949 Disorder of cartilage, unspecified: Secondary | ICD-10-CM | POA: Insufficient documentation

## 2021-02-27 DIAGNOSIS — I10 Essential (primary) hypertension: Secondary | ICD-10-CM

## 2021-02-27 DIAGNOSIS — I5022 Chronic systolic (congestive) heart failure: Secondary | ICD-10-CM | POA: Insufficient documentation

## 2021-02-27 DIAGNOSIS — M75101 Unspecified rotator cuff tear or rupture of right shoulder, not specified as traumatic: Secondary | ICD-10-CM | POA: Insufficient documentation

## 2021-02-27 DIAGNOSIS — M19011 Primary osteoarthritis, right shoulder: Secondary | ICD-10-CM | POA: Insufficient documentation

## 2021-02-27 LAB — BASIC METABOLIC PANEL
Anion gap: 7 (ref 5–15)
BUN: 9 mg/dL (ref 8–23)
CO2: 28 mmol/L (ref 22–32)
Calcium: 9.4 mg/dL (ref 8.9–10.3)
Chloride: 104 mmol/L (ref 98–111)
Creatinine, Ser: 0.97 mg/dL (ref 0.61–1.24)
GFR, Estimated: >60 mL/min (ref >60)
Glucose, Bld: 122 mg/dL — ABNORMAL HIGH (ref 70–99)
Potassium: 3.9 mmol/L (ref 3.5–5.1)
Sodium: 139 mmol/L (ref 135–145)

## 2021-02-27 LAB — CBC
HCT: 46.6 % (ref 39.0–52.0)
Hemoglobin: 15.3 g/dL (ref 13.0–17.0)
MCH: 28.1 pg (ref 26.0–34.0)
MCHC: 32.8 g/dL (ref 30.0–36.0)
MCV: 85.7 fL (ref 80.0–100.0)
Platelets: 191 10*3/uL (ref 150–400)
RBC: 5.44 MIL/uL (ref 4.22–5.81)
RDW: 13.7 % (ref 11.5–15.5)
WBC: 3.7 10*3/uL — ABNORMAL LOW (ref 4.0–10.5)
nRBC: 0 % (ref 0.0–0.2)

## 2021-03-02 ENCOUNTER — Encounter (HOSPITAL_COMMUNITY): Payer: Self-pay | Admitting: Physician Assistant

## 2021-03-02 NOTE — Anesthesia Preprocedure Evaluation (Deleted)
Anesthesia Evaluation    Airway        Dental   Pulmonary           Cardiovascular hypertension,      Neuro/Psych    GI/Hepatic   Endo/Other    Renal/GU      Musculoskeletal   Abdominal   Peds  Hematology   Anesthesia Other Findings   Reproductive/Obstetrics                             Anesthesia Physical Anesthesia Plan  ASA:   Anesthesia Plan:    Post-op Pain Management:    Induction:   PONV Risk Score and Plan:   Airway Management Planned:   Additional Equipment:   Intra-op Plan:   Post-operative Plan:   Informed Consent:   Plan Discussed with:   Anesthesia Plan Comments: (See PAT note 02/27/2021, Jodell Cipro Ward, PA-C)        Anesthesia Quick Evaluation

## 2021-03-02 NOTE — Progress Notes (Signed)
Anesthesia Chart Review   Case: [id number] Date/Time: 03/11/21 1005   Procedures:      SHOULDER ARTHROSCOPY WITH DEBRIDEMENT, SUBACROMIAL DECOMPRESSION, ROTATOR CUFF REPAIR AND BICEP TENODESIS (Right)     SHOULDER ARTHROSCOPY WITH DISTAL CLAVICLE EXCISION (Right: Shoulder)   Anesthesia type: Choice   Pre-op diagnosis: RIGHT SHOULDER CARTILAGE DISORDER, IMPINGEMENT SYNDROME, BICEP TENDINITIS, ROTATOR CUFF TEAR, OA   Location: [facility]OM 06 / [facility]   Surgeons: Bjorn Pippin, MD       DISCUSSION:66 y.o. never smoker with h/o HTN, CVA, CHF (EF 35-40%), right shoulder OA, rotator cuff tear scheduled for above procedure 03/11/21 with Dr. Ramond Marrow.   Seen by cardiology 01/27/2021. Per OV note, "Preoperative cardiovascular examination: Planning to undergo right shoulder scope and rotator cuff repair, date of surgery to be determined, with [facility].   No active chest pain to suggest ACS, no recent hospitalizations for decompensated heart failure, no known history of dysrhythmias or significant valvular heart disease.  EKG today shows normal sinus rhythm without underlying ischemia or injury pattern.  He is overall stable from a cardiovascular standpoint; however, does have compensated heart failure with reduced EF, and history of stroke in 2011.  He is low to moderate risk for upcoming noncardiac surgery this is due to nonmodifiable risk factors and comorbid conditions.   Recommend holding aspirin 7 days prior to his upcoming noncardiac surgery.  Recommend holding Farxiga 3 days prior to his upcoming noncardiac surgery."  Anticipate pt can proceed with planned procedure barring acute status change.   VS: BP 123/80    Pulse 78    Temp 36.7 C (Oral)    Resp 16    Ht 5\' 8"  (1.727 m)    Wt 71.8 kg    SpO2 98%    BMI 24.08 kg/m   PROVIDERS: Tysinger, , PA-C is PCP   Kermit Balo, DO is Cardiologist  LABS: Labs reviewed: Acceptable for surgery. (all labs ordered are listed, but only  abnormal results are displayed)  Labs Reviewed  CBC - Abnormal; Notable for the following components:      Result Value   WBC 3.7 (*)    All other components within normal limits  BASIC METABOLIC PANEL - Abnormal; Notable for the following components:   Glucose, Bld 122 (*)    All other components within normal limits     IMAGES:   EKG: 01/27/2021: NSR, 67 bpm, normal axis, without underlying ischemia or injury pattern.   CV: Echocardiogram 01/05/2021:    Left ventricle cavity is normal in size. Normal left ventricular wall  thickness. Normal diastolic filling pattern. Hypokinetic global wall  motion. Moderately depressed LV systolic function with visual EF 35-40%.  Left atrial cavity is mildly dilated.  Structurally normal trileaflet aortic valve.  Trace aortic regurgitation.  Structurally normal tricuspid valve. No evidence of tricuspid stenosis.  Mild tricuspid regurgitation.  No significant change from 06/27/2019.   Lexiscan Sestamibi Stress Test 08/15/2019: Nondiagnostic ECG stress. Myocardial perfusion is normal. There is a fixed mild defect in the inferior region suggestive of diaphragmatic attenuation. Normal LV size. Overall LV systolic function is abnormal without regional wall motion abnormalities. Stress LV EF: 47%.  No previous exam available for comparison. Low risk. Past Medical History:  Diagnosis Date   BPH (benign prostatic hyperplasia)    CHF (congestive heart failure) (HCC)    CVA (cerebral vascular accident) (HCC) 2011   numbness right index finger and right thumb and right thigh  GERD (gastroesophageal reflux disease)    Hyperlipidemia    Hypertension     Past Surgical History:  Procedure Laterality Date   CARPAL TUNNEL RELEASE Right    CYSTOSCOPY WITH INSERTION OF UROLIFT     LASIK     thumb     right, nerve injury repair   TRANSURETHRAL RESECTION OF PROSTATE N/A 12/03/2019   Procedure: TRANSURETHRAL RESECTION OF THE PROSTATE (TURP),  BIPOLAR;  Surgeon: Jannifer Hick, MD;  Location: [facility];  Service: Urology;  Laterality: N/A;  ONLY NEEDS 60 MIN    MEDICATIONS:  ascorbic acid (VITAMIN C) 1000 MG tablet   aspirin EC 81 MG tablet   betamethasone dipropionate 0.05 % cream   ENTRESTO 97-103 MG   FARXIGA 10 MG TABS tablet   nitroGLYCERIN (NITROSTAT) 0.4 MG SL tablet   simvastatin (ZOCOR) 80 MG tablet   No current facility-administered medications for this encounter.     Jodell Cipro Ward, PA-C [facility] [phone]

## 2021-03-11 ENCOUNTER — Encounter (HOSPITAL_COMMUNITY): Admission: RE | Payer: Self-pay | Source: Home / Self Care

## 2021-03-11 ENCOUNTER — Ambulatory Visit (HOSPITAL_COMMUNITY): Admission: RE | Admit: 2021-03-11 | Payer: 59 | Source: Home / Self Care | Admitting: Orthopaedic Surgery

## 2021-03-11 SURGERY — SHOULDER ARTHROSCOPY WITH SUBACROMIAL DECOMPRESSION, ROTATOR CUFF REPAIR AND BICEP TENDON REPAIR
Anesthesia: Choice | Site: Shoulder | Laterality: Right

## 2021-03-16 ENCOUNTER — Other Ambulatory Visit: Payer: Self-pay

## 2021-03-16 ENCOUNTER — Encounter: Payer: Self-pay | Admitting: Cardiology

## 2021-03-16 ENCOUNTER — Ambulatory Visit: Payer: 59 | Admitting: Cardiology

## 2021-03-16 VITALS — BP 120/78 | HR 77 | Temp 98.3°F | Resp 16 | Ht 68.0 in | Wt 160.6 lb

## 2021-03-16 DIAGNOSIS — Z8673 Personal history of transient ischemic attack (TIA), and cerebral infarction without residual deficits: Secondary | ICD-10-CM

## 2021-03-16 DIAGNOSIS — E782 Mixed hyperlipidemia: Secondary | ICD-10-CM

## 2021-03-16 DIAGNOSIS — I10 Essential (primary) hypertension: Secondary | ICD-10-CM

## 2021-03-16 DIAGNOSIS — R002 Palpitations: Secondary | ICD-10-CM

## 2021-03-16 DIAGNOSIS — I5020 Unspecified systolic (congestive) heart failure: Secondary | ICD-10-CM

## 2021-03-16 MED ORDER — METOPROLOL SUCCINATE ER 25 MG PO TB24: 25.0000 mg | ORAL_TABLET | Freq: Every day | ORAL | 0 refills | Status: DC

## 2021-03-16 NOTE — Progress Notes (Signed)
ID:  Adrian Willis, DOB [birth year], MRN [id number]  PCP:  Carlena Hurl, PA-C  Cardiologist:  Rex Kras, DO, [facility] (established care 06/20/2019)  Date: 03/16/21 Last Office Visit: 01/27/2021  Chief Complaint  Patient presents with   Follow-up   Palpitations    HPI  Adrian Willis is a 66 y.o. male who presents to the office with a chief complaint of "palpitations." Patient's past medical history and cardiovascular risk factors include history of stroke 2011, hypertension, hyperlipidemia, and heart failure with reduced EF, stage B, NYHA class II, advanced age.     In June 2021 referred to the office for evaluation of palpitations.  Monitoring noted sinus tachycardia burden without any significant dysrhythmias.  He was started on beta-blocker therapy and his symptoms improved.  He underwent an echocardiogram which noted moderately reduced LVEF and patient was also experiencing dyspnea on exertion.  He subsequently underwent ischemic evaluation and stress test was overall reported to be low risk.  Upon independent review of the MPI appeared to have a small size, mild intensity perfusion defect in the RCA distribution however shared decision was to treat it medically and he has done well.  Despite up titration of medical therapy his repeat echocardiogram noted LVEF of 35-40% with global hypokinesis and no significant valvular heart disease.  He presents today sooner than his originally scheduled visit due to symptoms of palpitations which she describes as " heart about to jump out."  Symptoms have been present since last Monday, March 09, 2021, occurs daily, duration less than 1 second at most, more noticeable when he is exerting himself, better with resting.  Patient states that he has been avoiding stairs as he is afraid of falling but interestingly enough is able to do kickboxing without any issues.  At times associated with shortness of breath.  He denies anginal discomfort  at rest or with effort related activities.  Patient did have an extended Holter monitor back in 2021 results reviewed with him in great detail and noted below for further reference.   FUNCTIONAL STATUS: Regularly does kickboxing, walks a lot at work; however, no exercise program or daily routine.    ALLERGIES: No Known Allergies  MEDICATION LIST PRIOR TO VISIT: Current Meds  Medication Sig   ascorbic acid (VITAMIN C) 1000 MG tablet Take 1,000 mg by mouth daily.   aspirin EC 81 MG tablet Take 1 tablet (81 mg total) by mouth daily. Swallow whole.   ENTRESTO 97-103 MG TAKE 1 TABLET TWICE A DAY   FARXIGA 10 MG TABS tablet TAKE 1 TABLET DAILY BEFORE BREAKFAST   metoprolol succinate (TOPROL XL) 25 MG 24 hr tablet Take 1 tablet (25 mg total) by mouth daily.   nitroGLYCERIN (NITROSTAT) 0.4 MG SL tablet Place 1 tablet (0.4 mg total) under the tongue every 5 (five) minutes as needed for chest pain. If you require more than two tablets five minutes apart go to the nearest ER via EMS.   simvastatin (ZOCOR) 80 MG tablet Take 1 tablet (80 mg total) by mouth daily. (Patient taking differently: Take 80 mg by mouth at bedtime.)     PAST MEDICAL HISTORY: Past Medical History:  Diagnosis Date   BPH (benign prostatic hyperplasia)    CHF (congestive heart failure) (HCC)    CVA (cerebral vascular accident) ([place]) 2011   numbness right index finger and right thumb and right thigh   GERD (gastroesophageal reflux disease)    Hyperlipidemia    Hypertension  PAST SURGICAL HISTORY: Past Surgical History:  Procedure Laterality Date   CARPAL TUNNEL RELEASE Right    CYSTOSCOPY WITH INSERTION OF UROLIFT     LASIK     thumb     right, nerve injury repair   TRANSURETHRAL RESECTION OF PROSTATE N/A 12/03/2019   Procedure: TRANSURETHRAL RESECTION OF THE PROSTATE (TURP), BIPOLAR;  Surgeon: Janith Lima, MD;  Location: [facility];  Service: Urology;  Laterality: N/A;  ONLY NEEDS 60 MIN    FAMILY HISTORY: The  patient family history includes Benign prostatic hyperplasia in his father; Cancer in his sister; Hypertension in his mother; Other in his father.  SOCIAL HISTORY:  The patient  reports that he has never smoked. He has never used smokeless tobacco. He reports that he does not drink alcohol and does not use drugs.  REVIEW OF SYSTEMS: Review of Systems  Cardiovascular:  Positive for palpitations. Negative for chest pain, dyspnea on exertion, leg swelling, orthopnea, paroxysmal nocturnal dyspnea and syncope.  Respiratory:  Positive for shortness of breath.    PHYSICAL EXAM: Vitals with BMI 03/16/2021 02/27/2021 01/27/2021  Height 5\' 8"  5\' 8"  5\' 8"   Weight 160 lbs 10 oz 158 lbs 6 oz 168 lbs  BMI 24.42 0000000 [id number]  Systolic [id number]  Diastolic 78 80 84  Pulse 77 78 71   CONSTITUTIONAL: Well-developed and well-nourished. No acute distress.  SKIN: Skin is warm and dry. No rash noted. No cyanosis. No pallor. No jaundice HEAD: Normocephalic and atraumatic.  EYES: No scleral icterus MOUTH/THROAT: Moist oral membranes.  NECK: No JVD present. No thyromegaly noted. No carotid bruits  LYMPHATIC: No visible cervical adenopathy.  CHEST Normal respiratory effort. No intercostal retractions  LUNGS: Clear to auscultation bilaterally.  no stridor. No wheezes. No rales.  CARDIOVASCULAR: Regular rate and rhythm, positive S1-S2, no murmurs rubs or gallops appreciated ABDOMINAL: Soft, nontender, nondistended, positive bowel in all 4 quadrants.  No apparent ascites.  EXTREMITIES: No peripheral edema, warm to touch.  HEMATOLOGIC: No significant bruising NEUROLOGIC: Oriented to person, place, and time. Nonfocal. Normal muscle tone.  PSYCHIATRIC: Normal mood and affect. Normal behavior. Cooperative  CARDIAC DATABASE: EKG: 01/27/2021: NSR, 67 bpm, normal axis, without underlying ischemia or injury pattern. 03/16/2021: Normal sinus rhythm.  Echocardiogram: 06/27/2019: Moderate global hypokinesis. LVEF 35-40%.   Grade I (impaired) diastolic dysfunction, normal LAP. Mild TR.   01/05/2021:  Left ventricle cavity is normal in size. Normal left ventricular wall thickness. Normal diastolic filling pattern. Hypokinetic global wall motion. Moderately depressed LV systolic function with visual EF 35-40%. Left atrial cavity is mildly dilated. Structurally normal trileaflet aortic valve. Trace aortic regurgitation. Structurally normal tricuspid valve. No evidence of tricuspid stenosis. Mild tricuspid regurgitation. No significant change from 06/27/2019. (On personal review grade 1 diastolic impairment).   Stress Testing: Lexiscan Sestamibi Stress Test 08/15/2019:  Nondiagnostic ECG stress.  Myocardial perfusion is normal.  There is a fixed mild defect in the inferior region suggestive of diaphragmatic attenuation.  Normal LV size. Overall LV systolic function is abnormal without regional wall motion abnormalities. Stress LV EF: 47%.  No previous exam available for comparison. Low risk.  Heart Catheterization: None  Event monitor 06/27/2019 - 07/11/2019: Diagnostic time: 100%  Dominant rhythm: Normal Sinus. HR 51-154 bpm. Avg HR 80 bpm. No atrial fibrillation, high grade AV block, or sinus pause >3sec noted. Heart rate >100bpm for approximately 9% of the monitoring period.  Heart rate <60bpm for <1% of the monitoring period.  Total ventricular ectopy <1%.  No patient triggered events recorded.   Carotid Duplex 06/27/2019: No hemodynamically significant arterial disease in the internal carotid artery bilaterally. Minimal increased intimal thickness bilaterally. Antegrade right vertebral artery flow. Antegrade left vertebral artery flow.  LABORATORY DATA: CBC Latest Ref Rng & Units 02/27/2021 01/05/2021 12/04/2019  WBC 4.0 - 10.5 K/uL 3.7(L) 4.6 8.3  Hemoglobin 13.0 - 17.0 g/dL 15.3 15.7 12.8(L)  Hematocrit 39.0 - 52.0 % 46.6 49.6 41.0  Platelets 150 - 400 K/uL 191 200 179    CMP Latest Ref Rng &  Units 02/27/2021 01/05/2021 01/30/2020  Glucose 70 - 99 mg/dL 122(H) 86 88  BUN 8 - 23 mg/dL 9 9 9   Creatinine 0.61 - 1.24 mg/dL 0.97 1.05 1.02  Sodium 135 - 145 mmol/L 139 141 141  Potassium 3.5 - 5.1 mmol/L 3.9 4.8 4.4  Chloride 98 - 111 mmol/L 104 101 102  CO2 22 - 32 mmol/L 28 22 25   Calcium 8.9 - 10.3 mg/dL 9.4 9.3 9.3  Total Protein 6.0 - 8.5 g/dL - 7.2 -  Total Bilirubin 0.0 - 1.2 mg/dL - 0.4 -  Alkaline Phos 44 - 121 IU/L - 62 -  AST 0 - 40 IU/L - 24 -  ALT 0 - 44 IU/L - 23 -    Lipid Panel     Component Value Date/Time   CHOL 136 01/05/2021 1108   TRIG 74 01/05/2021 1108   HDL 62 01/05/2021 1108   CHOLHDL 2.2 01/05/2021 1108   LDLCALC 59 01/05/2021 1108   LABVLDL 15 01/05/2021 1108    No results found for: HGBA1C No components found for: NTPROBNP Lab Results  Component Value Date   TSH 0.576 05/24/2019    BMP Recent Labs    01/05/21 1108 02/27/21 1028  NA 141 139  K 4.8 3.9  CL 101 104  CO2 22 28  GLUCOSE 86 122*  BUN 9 9  CREATININE 1.05 0.97  CALCIUM 9.3 9.4  GFRNONAA  --  >60   BNP (last 3 results) No results for input(s): PROBNP in the last 8760 hours.   TSH No results for input(s): TSH in the last 8760 hours.   CHOLESTEROL Recent Labs    01/05/21 1108  CHOL 136     Hepatic Function Panel Recent Labs    01/05/21 1108  PROT 7.2  ALBUMIN 4.3  AST 24  ALT 23  ALKPHOS 62  BILITOT 0.4     IMPRESSION:    ICD-10-CM   1. Palpitations  R00.2 metoprolol succinate (TOPROL XL) 25 MG 24 hr tablet    2. HFrEF (heart failure with reduced ejection fraction) (HCC)  I50.20 EKG 12-Lead    3. Essential hypertension  I10     4. Mixed hyperlipidemia  E78.2     5. History of stroke  Z86.73         RECOMMENDATIONS: KOBEY LICHTENFELS is a 66 y.o. male whose past medical history and cardiac risk factors include:  history of stroke, hypertension, hyperlipidemia, and and heart failure with reduced EF, stage B, NYHA class II, advanced  age.    Palpitations: Newly discovered/acute Prior cardiac work-up including echo, stress test, and extended Holter monitor results reviewed as part of medical decision making at today's office visit. EKG shows normal sinus rhythm without underlying ischemia injury pattern. Shared decision was to start Toprol-XL 25 mg p.o. daily.  He will call the office back in 2 weeks to see how his symptoms are.  If the symptoms are resolved no  additional work-up I will continue Toprol-XL for now.  However, if he continues to have palpitations despite being on Toprol-XL we will recommend repeating the extended Holter monitor to further evaluate for underlying dysrhythmias. Patient is agreeable with the plan of care.  HFrEF (heart failure with reduced ejection fraction) (HCC) Compensated, stage B, NYHA class II Overall functional capacity remains stable. Medications reconciled. Patient's blood pressure is well controlled, weight relatively stable, the shared decision was to hold off on further up titration.  Essential hypertension Office blood pressures are very well controlled. Medications reconciled. Overall functional status remains relatively at baseline. Educated on the importance of low-salt diet and 30 minutes of moderate intensity exercise as tolerated 5 days a week.  Mixed hyperlipidemia Currently on statin therapy. Most recent lipid profile from December 2022 reviewed, LDL currently at goal.  History of stroke Continue aspirin and statin therapy. Educated on importance of secondary prevention.  During today's office visit addressed newly discovered/acute exacerbation of palpitations, reviewed prior diagnostic test results including echo, stress test, and extended Holter monitor, discussed plan of care, prescribed pharmacological therapy, and will be followed up closely.  FINAL MEDICATION LIST END OF ENCOUNTER: Meds ordered this encounter  Medications   metoprolol succinate (TOPROL XL) 25  MG 24 hr tablet    Sig: Take 1 tablet (25 mg total) by mouth daily.    Dispense:  30 tablet    Refill:  0     Medications Discontinued During This Encounter  Medication Reason   betamethasone dipropionate 0.05 % cream       Current Outpatient Medications:    ascorbic acid (VITAMIN C) 1000 MG tablet, Take 1,000 mg by mouth daily., Disp: , Rfl:    aspirin EC 81 MG tablet, Take 1 tablet (81 mg total) by mouth daily. Swallow whole., Disp: 90 tablet, Rfl: 3   ENTRESTO 97-103 MG, TAKE 1 TABLET TWICE A DAY, Disp: 180 tablet, Rfl: 3   FARXIGA 10 MG TABS tablet, TAKE 1 TABLET DAILY BEFORE BREAKFAST, Disp: 90 tablet, Rfl: 3   metoprolol succinate (TOPROL XL) 25 MG 24 hr tablet, Take 1 tablet (25 mg total) by mouth daily., Disp: 30 tablet, Rfl: 0   nitroGLYCERIN (NITROSTAT) 0.4 MG SL tablet, Place 1 tablet (0.4 mg total) under the tongue every 5 (five) minutes as needed for chest pain. If you require more than two tablets five minutes apart go to the nearest ER via EMS., Disp: 30 tablet, Rfl: 0   simvastatin (ZOCOR) 80 MG tablet, Take 1 tablet (80 mg total) by mouth daily. (Patient taking differently: Take 80 mg by mouth at bedtime.), Disp: 90 tablet, Rfl: 3  Orders Placed This Encounter  Procedures   EKG 12-Lead   --Continue cardiac medications as reconciled in final medication list. --Return in about 4 weeks (around 04/13/2021) for Follow up, Palpitations, heart failure management.. Or sooner if needed. --Continue follow-up with your primary care physician regarding the management of your other chronic comorbid conditions.  Patient's questions and concerns were addressed to his satisfaction. He voices understanding of the instructions provided during this encounter.   This note was created using a voice recognition software as a result there may be grammatical errors inadvertently enclosed that do not reflect the nature of this encounter. Every attempt is made to correct such errors.  Rex Kras, [place], [facility]  Pager: [phone] Office: [phone]

## 2021-03-19 ENCOUNTER — Ambulatory Visit: Payer: Medicare Other | Admitting: Medical

## 2021-04-21 ENCOUNTER — Telehealth: Payer: Self-pay | Admitting: Cardiology

## 2021-04-21 ENCOUNTER — Other Ambulatory Visit: Payer: Self-pay

## 2021-04-21 ENCOUNTER — Telehealth: Payer: Self-pay | Admitting: Medical

## 2021-04-21 DIAGNOSIS — Z8673 Personal history of transient ischemic attack (TIA), and cerebral infarction without residual deficits: Secondary | ICD-10-CM

## 2021-04-21 DIAGNOSIS — I5020 Unspecified systolic (congestive) heart failure: Secondary | ICD-10-CM

## 2021-04-21 MED ORDER — ASPIRIN EC 81 MG PO TBEC: 81.0000 mg | DELAYED_RELEASE_TABLET | Freq: Every day | ORAL | 1 refills | Status: DC

## 2021-04-21 MED ORDER — SIMVASTATIN 80 MG PO TABS: 80.0000 mg | ORAL_TABLET | Freq: Every day | ORAL | 1 refills | Status: DC

## 2021-04-21 MED ORDER — ENTRESTO 97-103 MG PO TABS: 1.0000 | ORAL_TABLET | Freq: Two times a day (BID) | ORAL | 3 refills | Status: DC

## 2021-04-21 MED ORDER — DAPAGLIFLOZIN PROPANEDIOL 10 MG PO TABS: 10.0000 mg | ORAL_TABLET | Freq: Every day | ORAL | 3 refills | Status: DC

## 2021-04-21 NOTE — Telephone Encounter (Signed)
Pt called and is needing his rx changed to a different pharmacy due to changing insurance please. Please send zocor and aspirin to the [id number] [place], [place] - [address] ?

## 2021-04-21 NOTE — Telephone Encounter (Signed)
Patient requesting all medication be sent to [facility], as he no longer has the same insurance [employer]). He is requesting a refill for entresto and farxiga. ?

## 2021-04-21 NOTE — Telephone Encounter (Signed)
Ok done

## 2021-04-21 NOTE — Telephone Encounter (Signed)
done

## 2021-04-28 ENCOUNTER — Ambulatory Visit: Payer: 59 | Admitting: Cardiology

## 2021-05-07 ENCOUNTER — Ambulatory Visit: Payer: 59 | Admitting: Cardiology

## 2021-05-07 ENCOUNTER — Encounter: Payer: Self-pay | Admitting: Cardiology

## 2021-05-07 VITALS — BP 132/85 | HR 72 | Temp 98.2°F | Resp 16 | Ht 68.0 in | Wt 158.0 lb

## 2021-05-07 DIAGNOSIS — Z8673 Personal history of transient ischemic attack (TIA), and cerebral infarction without residual deficits: Secondary | ICD-10-CM

## 2021-05-07 DIAGNOSIS — I10 Essential (primary) hypertension: Secondary | ICD-10-CM

## 2021-05-07 DIAGNOSIS — I5020 Unspecified systolic (congestive) heart failure: Secondary | ICD-10-CM

## 2021-05-07 DIAGNOSIS — E782 Mixed hyperlipidemia: Secondary | ICD-10-CM

## 2021-05-07 DIAGNOSIS — R002 Palpitations: Secondary | ICD-10-CM

## 2021-05-07 MED ORDER — METOPROLOL SUCCINATE ER 25 MG PO TB24: 25.0000 mg | ORAL_TABLET | Freq: Every morning | ORAL | 0 refills | Status: DC

## 2021-05-07 NOTE — Progress Notes (Signed)
? ? ?ID:  Adrian Willis, DOB 1955/07/28, MRN [id number] ? ?PCP:  Adrian Canavan, PA-C  ?Cardiologist:  Adrian Lerner, DO, [facility] (established care 06/20/2019) ? ?Date: 05/07/21 ?Last Office Visit: 03/16/2021 ? ?Chief Complaint  ?Patient presents with  ? heart failure management  ? ? ?HPI  ?Adrian Willis is a 66 y.o. male whose past medical history and cardiovascular risk factors include history of stroke 2011, hypertension, hyperlipidemia, and heart failure with reduced EF, stage B, NYHA class II, advanced age.   ? ?Referred to the practice in June 2021 for evaluation of palpitations.  Underwent a cardiac monitor and was noted to have a sinus tachycardia burden without any significant dysrhythmias.  He was started on beta-blocker therapy and his symptoms improved.  He underwent an echocardiogram to evaluate for LVEF which was reported to be moderately reduced and upon further questioning he did complain/mention dyspnea on exertion.  Independently reviewed his MPI which noted a small size, mild intensity, perfusion defect involving the RCA distribution; however, since he was asymptomatic in regards to angina pectoris the shared decision was to treat him medically.  He is done very well with medical therapy. ? ?He presented to the office back in February 2023 to review the results of his echocardiogram which notes LVEF to be 35-40% with global hypokinesis and no significant valvular heart disease.  However he would continue to have symptoms of palpitations which she described as " heart about to jump out."  Shared decision was to start Toprol-XL 25 mg p.o. daily.  He now presents for follow-up. ? ?Since initiation of Toprol-XL patient states that the episodes of palpitations have significantly reduced and the durations are minimal.  Otherwise he denies any chest pain or heart failure symptoms. ? ?FUNCTIONAL STATUS: Regularly does kickboxing, walks a lot at work; however, no exercise program or daily routine.    ? ?ALLERGIES: ?No Known Allergies ? ?MEDICATION LIST PRIOR TO VISIT: ?Current Meds  ?Medication Sig  ? ascorbic acid (VITAMIN C) 1000 MG tablet Take 1,000 mg by mouth daily.  ? aspirin EC 81 MG tablet Take 1 tablet (81 mg total) by mouth daily. Swallow whole.  ? dapagliflozin propanediol (FARXIGA) 10 MG TABS tablet Take 1 tablet (10 mg total) by mouth daily before breakfast.  ? metoprolol succinate (TOPROL XL) 25 MG 24 hr tablet Take 1 tablet (25 mg total) by mouth in the morning.  ? nitroGLYCERIN (NITROSTAT) 0.4 MG SL tablet Place 1 tablet (0.4 mg total) under the tongue every 5 (five) minutes as needed for chest pain. If you require more than two tablets five minutes apart go to the nearest ER via EMS.  ? sacubitril-valsartan (ENTRESTO) 97-103 MG Take 1 tablet by mouth 2 (two) times daily.  ? simvastatin (ZOCOR) 80 MG tablet Take 1 tablet (80 mg total) by mouth daily.  ?  ? ?PAST MEDICAL HISTORY: ?Past Medical History:  ?Diagnosis Date  ? BPH (benign prostatic hyperplasia)   ? CHF (congestive heart failure) (HCC)   ? CVA (cerebral vascular accident) (HCC) 2011  ? numbness right index finger and right thumb and right thigh  ? GERD (gastroesophageal reflux disease)   ? Hyperlipidemia   ? Hypertension   ? ? ?PAST SURGICAL HISTORY: ?Past Surgical History:  ?Procedure Laterality Date  ? CARPAL TUNNEL RELEASE Right   ? CYSTOSCOPY WITH INSERTION OF UROLIFT    ? LASIK    ? thumb    ? right, nerve injury repair  ? TRANSURETHRAL RESECTION  OF PROSTATE N/A 12/03/2019  ? Procedure: TRANSURETHRAL RESECTION OF THE PROSTATE (TURP), BIPOLAR;  Surgeon: Adrian Willis, Adrian R, MD;  Location: [facility];  Service: Urology;  Laterality: N/A;  ONLY NEEDS 60 MIN  ? ? ?FAMILY HISTORY: ?The patient family history includes Benign prostatic hyperplasia in his father; Cancer in his sister; Hypertension in his mother; Other in his father. ? ?SOCIAL HISTORY:  ?The patient  reports that he has never smoked. He has never used smokeless tobacco. He reports  that he does not drink alcohol and does not use drugs. ? ?REVIEW OF SYSTEMS: ?Review of Systems  ?Cardiovascular:  Positive for palpitations (improved significantly). Negative for chest pain, dyspnea on exertion, leg swelling, orthopnea, paroxysmal nocturnal dyspnea and syncope.  ?Respiratory:  Negative for shortness of breath.   ? ?PHYSICAL EXAM: ? ?  05/07/2021  ? 10:25 AM 03/16/2021  ?  2:09 PM 02/27/2021  ? 10:24 AM  ?Vitals with BMI  ?Height 5\' 8"  5\' 8"  5\' 8"   ?Weight 158 lbs 160 lbs 10 oz 158 lbs 6 oz  ?BMI 24.03 24.42 24.09  ?Systolic 132 120 [id number]  ?Diastolic 85 78 80  ?Pulse 72 77 78  ? ?CONSTITUTIONAL: Well-developed and well-nourished. No acute distress.  ?SKIN: Skin is warm and dry. No rash noted. No cyanosis. No pallor. No jaundice ?HEAD: Normocephalic and atraumatic.  ?EYES: No scleral icterus ?MOUTH/THROAT: Moist oral membranes.  ?NECK: No JVD present. No thyromegaly noted. No carotid bruits  ?LYMPHATIC: No visible cervical adenopathy.  ?CHEST Normal respiratory effort. No intercostal retractions  ?LUNGS: Clear to auscultation bilaterally.  no stridor. No wheezes. No rales.  ?CARDIOVASCULAR: Regular rate and rhythm, positive S1-S2, no murmurs rubs or gallops appreciated ?ABDOMINAL: Soft, nontender, nondistended, positive bowel in all 4 quadrants.  No apparent ascites.  ?EXTREMITIES: No peripheral edema, warm to touch.  ?HEMATOLOGIC: No significant bruising ?NEUROLOGIC: Oriented to person, place, and time. Nonfocal. Normal muscle tone.  ?PSYCHIATRIC: Normal mood and affect. Normal behavior. Cooperative ?No change in physical examination since last office visit. ? ?CARDIAC DATABASE: ?EKG: ?01/27/2021: NSR, 67 bpm, normal axis, without underlying ischemia or injury pattern. ?03/16/2021: Normal sinus rhythm. ? ?Echocardiogram: ?06/27/2019: Moderate global hypokinesis. LVEF 35-40%.  Grade I (impaired) diastolic dysfunction, normal LAP. Mild TR.  ? ?01/05/2021:  ?Left ventricle cavity is normal in size. Normal left  ventricular wall thickness. Normal diastolic filling pattern. Hypokinetic global wall motion. Moderately depressed LV systolic function with visual EF 35-40%. ?Left atrial cavity is mildly dilated. ?Structurally normal trileaflet aortic valve. Trace aortic regurgitation. ?Structurally normal tricuspid valve. No evidence of tricuspid stenosis. Mild tricuspid regurgitation. ?No significant change from 06/27/2019. (On personal review grade 1 diastolic impairment).  ? ?Stress Testing: ?Lexiscan Sestamibi Stress Test 08/15/2019:  ?Nondiagnostic ECG stress.  ?Myocardial perfusion is normal.  ?There is a fixed mild defect in the inferior region suggestive of diaphragmatic attenuation.  ?Normal LV size. Overall LV systolic function is abnormal without regional wall motion abnormalities. Stress LV EF: 47%.  ?No previous exam available for comparison. Low risk. ? ?Heart Catheterization: ?None ? ?Event monitor 06/27/2019 - 07/11/2019: ?Diagnostic time: 100%  ?Dominant rhythm: Normal Sinus. ?HR 51-154 bpm. Avg HR 80 bpm. ?No atrial fibrillation, high grade AV block, or sinus pause >3sec noted. ?Heart rate >100bpm for approximately 9% of the monitoring period.  ?Heart rate <60bpm for <1% of the monitoring period.  ?Total ventricular ectopy <1%.  ?No patient triggered events recorded.  ? ?Carotid Duplex 06/27/2019: No hemodynamically significant arterial disease in the internal  carotid artery bilaterally. Minimal increased intimal thickness bilaterally. Antegrade right vertebral artery flow. Antegrade left vertebral artery flow. ? ?LABORATORY DATA: ? ?  Latest Ref Rng & Units 02/27/2021  ? 10:28 AM 01/05/2021  ? 11:08 AM 12/04/2019  ?  5:40 AM  ?CBC  ?WBC 4.0 - 10.5 K/uL 3.7   4.6   8.3    ?Hemoglobin 13.0 - 17.0 g/dL 16.3   84.5   36.4    ?Hematocrit 39.0 - 52.0 % 46.6   49.6   41.0    ?Platelets 150 - 400 K/uL 191   200   179    ? ? ? ?  Latest Ref Rng & Units 02/27/2021  ? 10:28 AM 01/05/2021  ? 11:08 AM 01/30/2020  ? 12:20 PM   ?CMP  ?Glucose 70 - 99 mg/dL 680   86   88    ?BUN 8 - 23 mg/dL 9   9   9     ?Creatinine 0.61 - 1.24 mg/dL   3.21   2.24    ?Sodium 135 - 145 mmol/L 139   141   141    ?Potassium 3.5 - 5.1 mmol/L 3

## 2021-05-08 ENCOUNTER — Ambulatory Visit: Payer: 59 | Admitting: Cardiology

## 2021-08-27 ENCOUNTER — Telehealth: Payer: Self-pay | Admitting: Cardiology

## 2021-08-27 NOTE — Telephone Encounter (Signed)
Pharmacy is telling him he has no refills on two medications. He has an appointment w/ST 11/06/21. He finished his metoprolol last week, and will finish his entresto in 5 days, so these he is needing refilled.

## 2021-08-28 ENCOUNTER — Telehealth: Payer: Self-pay | Admitting: Cardiology

## 2021-08-28 ENCOUNTER — Other Ambulatory Visit: Payer: Self-pay

## 2021-08-28 DIAGNOSIS — R002 Palpitations: Secondary | ICD-10-CM

## 2021-08-28 DIAGNOSIS — I5020 Unspecified systolic (congestive) heart failure: Secondary | ICD-10-CM

## 2021-08-28 MED ORDER — METOPROLOL SUCCINATE ER 25 MG PO TB24: 25.0000 mg | ORAL_TABLET | Freq: Every morning | ORAL | 0 refills | Status: DC

## 2021-08-28 MED ORDER — ENTRESTO 97-103 MG PO TABS: 1.0000 | ORAL_TABLET | Freq: Two times a day (BID) | ORAL | 3 refills | Status: DC

## 2021-08-28 NOTE — Telephone Encounter (Signed)
Done

## 2021-08-28 NOTE — Telephone Encounter (Signed)
Patient requesting entresto refill, as well as metoprolol. He called a few days ago, needs refill asap. Please call today.

## 2021-10-20 ENCOUNTER — Other Ambulatory Visit: Payer: Self-pay | Admitting: Medical

## 2021-10-20 DIAGNOSIS — Z8673 Personal history of transient ischemic attack (TIA), and cerebral infarction without residual deficits: Secondary | ICD-10-CM

## 2021-10-20 NOTE — Telephone Encounter (Signed)
Pt has an appt in December 

## 2021-11-06 ENCOUNTER — Encounter: Payer: Self-pay | Admitting: Cardiology

## 2021-11-06 ENCOUNTER — Ambulatory Visit: Payer: 59 | Admitting: Cardiology

## 2021-11-06 VITALS — BP 121/78 | HR 65 | Temp 98.1°F | Resp 16 | Ht 68.0 in | Wt 160.6 lb

## 2021-11-06 DIAGNOSIS — I10 Essential (primary) hypertension: Secondary | ICD-10-CM

## 2021-11-06 DIAGNOSIS — I429 Cardiomyopathy, unspecified: Secondary | ICD-10-CM

## 2021-11-06 DIAGNOSIS — R002 Palpitations: Secondary | ICD-10-CM

## 2021-11-06 DIAGNOSIS — E782 Mixed hyperlipidemia: Secondary | ICD-10-CM

## 2021-11-06 DIAGNOSIS — I5020 Unspecified systolic (congestive) heart failure: Secondary | ICD-10-CM

## 2021-11-06 NOTE — Progress Notes (Signed)
ID:  Adrian Willis, DOB [birth year], MRN [id number]  PCP:  Carlena Hurl, PA-C  Cardiologist:  Rex Kras, DO, [facility] (established care 06/20/2019)  Date: 11/06/21 Last Office Visit: 05/07/2021  Chief Complaint  Patient presents with   Congestive Heart Failure   Follow-up    6 months    HPI  Adrian Willis is a 66 y.o. male whose past medical history and cardiovascular risk factors include history of stroke 2011, hypertension, hyperlipidemia, and heart failure with reduced EF, stage B, NYHA class II, advanced age.    Initially referred to the practice for evaluation of palpitations.  His symptoms improved with metoprolol and work-up noted moderately reduced LVEF.  He underwent ischemic work-up which noted a small size, mild intensity, perfusion defect involving the RCA distribution however since he was asymptomatic with regards to no chest pain and overall functional capacity was stable he chose to treated medically.  Patient presents today for 26-month follow-up visit.  Over the last 6 months he has done well from a cardiovascular standpoint.  He denies anginal discomfort or heart failure symptoms.  No hospitalizations or urgent care visits for cardiovascular symptoms in the recent past.  His palpitations/heart pounding sensation is well controlled on the current dose of Toprol-XL.  No use of sublingual nitroglycerin tablets since last office visit.  And his overall functional capacity remains stable.  FUNCTIONAL STATUS: Regularly does kickboxing.   ALLERGIES: No Known Allergies  MEDICATION LIST PRIOR TO VISIT: Current Meds  Medication Sig   ascorbic acid (VITAMIN C) 1000 MG tablet Take 1,000 mg by mouth daily.   ASPIRIN LOW DOSE 81 MG tablet TAKE 1 TABLET (81 MG TOTAL) BY MOUTH DAILY. SWALLOW WHOLE.   dapagliflozin propanediol (FARXIGA) 10 MG TABS tablet Take 1 tablet (10 mg total) by mouth daily before breakfast.   sacubitril-valsartan (ENTRESTO) 97-103 MG Take 1  tablet by mouth 2 (two) times daily.   simvastatin (ZOCOR) 80 MG tablet Take 1 tablet (80 mg total) by mouth daily.     PAST MEDICAL HISTORY: Past Medical History:  Diagnosis Date   BPH (benign prostatic hyperplasia)    CHF (congestive heart failure) ([place])    CVA (cerebral vascular accident) ([place]) 2011   numbness right index finger and right thumb and right thigh   GERD (gastroesophageal reflux disease)    Hyperlipidemia    Hypertension     PAST SURGICAL HISTORY: Past Surgical History:  Procedure Laterality Date   CARPAL TUNNEL RELEASE Right    CYSTOSCOPY WITH INSERTION OF UROLIFT     LASIK     thumb     right, nerve injury repair   TRANSURETHRAL RESECTION OF PROSTATE N/A 12/03/2019   Procedure: TRANSURETHRAL RESECTION OF THE PROSTATE (TURP), BIPOLAR;  Surgeon: Janith Lima, MD;  Location: [facility];  Service: Urology;  Laterality: N/A;  ONLY NEEDS 60 MIN    FAMILY HISTORY: The patient family history includes Benign prostatic hyperplasia in his father; Cancer in his sister; Hypertension in his mother; Other in his father.  SOCIAL HISTORY:  The patient  reports that he has never smoked. He has never used smokeless tobacco. He reports that he does not drink alcohol and does not use drugs.  REVIEW OF SYSTEMS: Review of Systems  Cardiovascular:  Positive for palpitations (improved significantly). Negative for chest pain, dyspnea on exertion, leg swelling, orthopnea, paroxysmal nocturnal dyspnea and syncope.  Respiratory:  Negative for shortness of breath.     PHYSICAL EXAM:  11/06/2021    1:51 PM 05/07/2021   10:25 AM 03/16/2021    2:09 PM  Vitals with BMI  Height 5\' 8"  5\' 8"  5\' 8"   Weight 160 lbs 10 oz 158 lbs 160 lbs 10 oz  BMI 24.42 24.03 24.42  Systolic 121 132  Diastolic 78 85 78  Pulse 65 72 77   Physical Exam  Constitutional: No distress.  Age appropriate, hemodynamically stable.   Neck: No JVD present.  Cardiovascular: Normal rate, regular rhythm, S1  normal, S2 normal, intact distal pulses and normal pulses. Exam reveals no gallop, no S3 and no S4.  No murmur heard. Pulmonary/Chest: Effort normal and breath sounds normal. No stridor. He has no wheezes. He has no rales.  Abdominal: Soft. Bowel sounds are normal. He exhibits no distension. There is no abdominal tenderness.  Musculoskeletal:        General: No edema.     Cervical back: Neck supple.  Neurological: He is alert and oriented to person, place, and time. He has intact cranial nerves (2-12).  Skin: Skin is warm and moist.   CARDIAC DATABASE: EKG: 11/06/2021: Normal sinus rhythm, 63 bpm, without underlying ischemia or injury pattern.  Echocardiogram: 06/27/2019: Moderate global hypokinesis. LVEF 35-40%.  Grade I (impaired) diastolic dysfunction, normal LAP. Mild TR.   01/05/2021:  Left ventricle cavity is normal in size. Normal left ventricular wall thickness. Normal diastolic filling pattern. Hypokinetic global wall motion. Moderately depressed LV systolic function with visual EF 35-40%. Left atrial cavity is mildly dilated. Structurally normal trileaflet aortic valve. Trace aortic regurgitation. Structurally normal tricuspid valve. No evidence of tricuspid stenosis. Mild tricuspid regurgitation. No significant change from 06/27/2019. (On personal review grade 1 diastolic impairment).   Stress Testing: Lexiscan Sestamibi Stress Test 08/15/2019:  Nondiagnostic ECG stress.  Myocardial perfusion is normal.  There is a fixed mild defect in the inferior region suggestive of diaphragmatic attenuation.  Normal LV size. Overall LV systolic function is abnormal without regional wall motion abnormalities. Stress LV EF: 47%.  No previous exam available for comparison. Low risk.  Heart Catheterization: None  Event monitor 06/27/2019 - 07/11/2019: Diagnostic time: 100%  Dominant rhythm: Normal Sinus. HR 51-154 bpm. Avg HR 80 bpm. No atrial fibrillation, high grade AV block, or  sinus pause >3sec noted. Heart rate >100bpm for approximately 9% of the monitoring period.  Heart rate <60bpm for <1% of the monitoring period.  Total ventricular ectopy <1%.  No patient triggered events recorded.   Carotid Duplex 06/27/2019: No hemodynamically significant arterial disease in the internal carotid artery bilaterally. Minimal increased intimal thickness bilaterally. Antegrade right vertebral artery flow. Antegrade left vertebral artery flow.  LABORATORY DATA:    Latest Ref Rng & Units 02/27/2021   10:28 AM 01/05/2021   11:08 AM 12/04/2019    5:40 AM  CBC  WBC 4.0 - 10.5 K/uL 3.7  4.6  8.3   Hemoglobin 13.0 - 17.0 g/dL 04/27/2021  01/07/2021  12/06/2019   Hematocrit 39.0 - 52.0 % 46.6  49.6  41.0   Platelets 150 - 400 K/uL 191  200  179        Latest Ref Rng & Units 02/27/2021   10:28 AM 01/05/2021   11:08 AM 01/30/2020   12:20 PM  CMP  Glucose 70 - 99 mg/dL 04/27/2021  86  88   BUN 8 - 23 mg/dL 9  9  9    Creatinine 0.61 - 1.24 mg/dL 01/07/2021  03/29/2020  324   Sodium 135 -  145 mmol/L 139  141  141   Potassium 3.5 - 5.1 mmol/L 3.9  4.8  4.4   Chloride 98 - 111 mmol/L 104  101  102   CO2 22 - 32 mmol/L 28  22  25    Calcium 8.9 - 10.3 mg/dL 9.4  9.3  9.3   Total Protein 6.0 - 8.5 g/dL  7.2    Total Bilirubin 0.0 - 1.2 mg/dL  0.4    Alkaline Phos 44 - 121 IU/L  62    AST 0 - 40 IU/L  24    ALT 0 - 44 IU/L  23      Lipid Panel     Component Value Date/Time   CHOL 136 01/05/2021 1108   TRIG 74 01/05/2021 1108   HDL 62 01/05/2021 1108   CHOLHDL 2.2 01/05/2021 1108   LDLCALC 59 01/05/2021 1108   LABVLDL 15 01/05/2021 1108    No results found for: "HGBA1C" No components found for: "NTPROBNP" Lab Results  Component Value Date   TSH 0.576 05/24/2019    BMP Recent Labs    01/05/21 1108 02/27/21 1028  NA 141 139  K 4.8 3.9  CL 101 104  CO2 22 28  GLUCOSE 86 122*  BUN 9 9  CREATININE 1.05 0.97  CALCIUM 9.3 9.4  GFRNONAA  --  >60   BNP (last 3 results) No results for input(s):  "PROBNP" in the last 8760 hours.   TSH No results for input(s): "TSH" in the last 8760 hours.   CHOLESTEROL Recent Labs    01/05/21 1108  CHOL 136     Hepatic Function Panel Recent Labs    01/05/21 1108  PROT 7.2  ALBUMIN 4.3  AST 24  ALT 23  ALKPHOS 62  BILITOT 0.4     IMPRESSION:    ICD-10-CM   1. HFrEF (heart failure with reduced ejection fraction) (HCC)  I50.20 EKG 12-Lead    MR CARDIAC MORPHOLOGY W WO CONTRAST    2. Cardiomyopathy, unspecified type (HCC)  I42.9 MR CARDIAC MORPHOLOGY W WO CONTRAST    3. Essential hypertension  I10     4. Mixed hyperlipidemia  E78.2     5. Palpitations  R00.2         RECOMMENDATIONS: Adrian Willis is a 66 y.o. male whose past medical history and cardiac risk factors include:  history of stroke, hypertension, hyperlipidemia, and and heart failure with reduced EF, stage B, NYHA class II, advanced age.    HFrEF (heart failure with reduced ejection fraction) (HCC) / Cardiomyopathy, unspecified type (HCC) Stage B, NYHA class II Compensated  Medication reconciled.  Reemphasized the importance of a low-salt diet, strict I's and O's, daily weights.  Underlying cause of his cardiomyopathy is unknown; therefore, recommended cardiac MRI for further evaluation & management.  Once a cardiac MRI is scheduled we will need to order CBC and BMP.  Essential hypertension Blood pressures are well controlled. No medication changes warranted at this time.  Mixed hyperlipidemia Currently on simvastatin.   He denies myalgia or other side effects. Currently managed by primary care provider.  Palpitations Well-controlled. Continue Toprol-XL. No additional changes warranted at this time. No near-syncope or syncopal events.  FINAL MEDICATION LIST END OF ENCOUNTER: No orders of the defined types were placed in this encounter.    Medications Discontinued During This Encounter  Medication Reason   metoprolol succinate (TOPROL XL)  25 MG 24 hr tablet       Current Outpatient  Medications:    ascorbic acid (VITAMIN C) 1000 MG tablet, Take 1,000 mg by mouth daily., Disp: , Rfl:    ASPIRIN LOW DOSE 81 MG tablet, TAKE 1 TABLET (81 MG TOTAL) BY MOUTH DAILY. SWALLOW WHOLE., Disp: 90 tablet, Rfl: 0   dapagliflozin propanediol (FARXIGA) 10 MG TABS tablet, Take 1 tablet (10 mg total) by mouth daily before breakfast., Disp: 90 tablet, Rfl: 3   sacubitril-valsartan (ENTRESTO) 97-103 MG, Take 1 tablet by mouth 2 (two) times daily., Disp: 180 tablet, Rfl: 3   simvastatin (ZOCOR) 80 MG tablet, Take 1 tablet (80 mg total) by mouth daily., Disp: 90 tablet, Rfl: 1   nitroGLYCERIN (NITROSTAT) 0.4 MG SL tablet, Place 1 tablet (0.4 mg total) under the tongue every 5 (five) minutes as needed for chest pain. If you require more than two tablets five minutes apart go to the nearest ER via EMS. (Patient not taking: Reported on 11/06/2021), Disp: 30 tablet, Rfl: 0  Orders Placed This Encounter  Procedures   MR CARDIAC MORPHOLOGY W WO CONTRAST   EKG 12-Lead    --Continue cardiac medications as reconciled in final medication list. --Return in about 6 months (around 05/08/2022) for Follow up cardiomyopathy. . Or sooner if needed. --Continue follow-up with your primary care physician regarding the management of your other chronic comorbid conditions.  Patient's questions and concerns were addressed to his satisfaction. He voices understanding of the instructions provided during this encounter.   This note was created using a voice recognition software as a result there may be grammatical errors inadvertently enclosed that do not reflect the nature of this encounter. Every attempt is made to correct such errors.  Tessa Lerner, [place], [place] [facility]  Pager: [phone] Office: [phone]

## 2021-11-10 ENCOUNTER — Other Ambulatory Visit: Payer: Self-pay | Admitting: Cardiology

## 2021-11-10 DIAGNOSIS — R002 Palpitations: Secondary | ICD-10-CM

## 2021-11-10 DIAGNOSIS — I5020 Unspecified systolic (congestive) heart failure: Secondary | ICD-10-CM

## 2021-11-23 ENCOUNTER — Other Ambulatory Visit: Payer: Self-pay | Admitting: Medical

## 2021-12-28 ENCOUNTER — Encounter: Payer: Self-pay | Admitting: Family Medicine

## 2021-12-28 ENCOUNTER — Ambulatory Visit: Payer: 59 | Admitting: Family Medicine

## 2021-12-28 VITALS — BP 114/88 | HR 77 | Wt 163.0 lb

## 2021-12-28 DIAGNOSIS — M25561 Pain in right knee: Secondary | ICD-10-CM | POA: Diagnosis not present

## 2021-12-28 DIAGNOSIS — M542 Cervicalgia: Secondary | ICD-10-CM | POA: Diagnosis not present

## 2021-12-28 NOTE — Progress Notes (Signed)
   Subjective:    Patient ID: Adrian Willis, male    DOB: 1955-06-26, 66 y.o.   MRN: [id number]  HPI He is here for evaluation of phimosis.  He has a 10-day history of left-sided neck pain that does radiate into the shoulder.  He states that he woke up 1 day with this.  No numbness, tingling or weakness in the arms.  The pain is worse with rotation of the neck.  He has been using heat and ice on this with minimal benefit.  He also states that approximately 2 weeks ago he noted some right knee pain with no history of previous injury or overuse.  No popping, locking or grinding.   Review of Systems     Objective:   Physical Exam Alert and in no distress.  Slight discomfort with flexion of the neck as well as left-sided rotation.  No palpable tenderness.  Normal motor, sensory and DTRs in the arms. Right knee exam shows no palpable tenderness or effusion.  Joint lines nontender.  Anterior drawer negative.  Medial McMurray's testing causes discomfort.       Assessment & Plan:  Neck pain - Plan: Ambulatory referral to Physical Therapy  Acute pain of right knee - Plan: Ambulatory referral to Physical Therapy Heat to your neck for 20 minutes and gentle stretching after that.  Do this a couple times per day..  For the right knee again heat for 20 minutes and take Tylenol regularly.  2 tablets 4 times per day.  If you need more pain relief then you can take 2 Aleve twice per day as well as the Tylenol Recheck appointment was not made but did state that if this did not work, he should set up another appointment for further evaluation including x-rays and possible injection for the knee.  He was comfortable with that.

## 2021-12-28 NOTE — Patient Instructions (Signed)
Heat to your neck for 20 minutes and gentle stretching after that.  Do this a couple times per day..  For the right knee again heat for 20 minutes and take Tylenol regularly.  2 tablets 4 times per day.  If you need more pain relief then you can take 2 Aleve twice per day as well as the Tylenol

## 2022-01-06 ENCOUNTER — Encounter: Payer: Self-pay | Admitting: Medical

## 2022-01-06 ENCOUNTER — Ambulatory Visit: Payer: 59 | Admitting: Medical

## 2022-01-06 VITALS — BP 124/72 | HR 56 | Temp 98.3°F | Ht 68.0 in | Wt 164.8 lb

## 2022-01-06 DIAGNOSIS — Z0000 Encounter for general adult medical examination without abnormal findings: Secondary | ICD-10-CM | POA: Diagnosis not present

## 2022-01-06 DIAGNOSIS — R350 Frequency of micturition: Secondary | ICD-10-CM

## 2022-01-06 DIAGNOSIS — Z125 Encounter for screening for malignant neoplasm of prostate: Secondary | ICD-10-CM

## 2022-01-06 DIAGNOSIS — Z7185 Encounter for immunization safety counseling: Secondary | ICD-10-CM

## 2022-01-06 DIAGNOSIS — I5020 Unspecified systolic (congestive) heart failure: Secondary | ICD-10-CM | POA: Diagnosis not present

## 2022-01-06 DIAGNOSIS — N529 Male erectile dysfunction, unspecified: Secondary | ICD-10-CM

## 2022-01-06 DIAGNOSIS — I10 Essential (primary) hypertension: Secondary | ICD-10-CM

## 2022-01-06 DIAGNOSIS — Z23 Encounter for immunization: Secondary | ICD-10-CM

## 2022-01-06 DIAGNOSIS — N401 Enlarged prostate with lower urinary tract symptoms: Secondary | ICD-10-CM

## 2022-01-06 DIAGNOSIS — Z8673 Personal history of transient ischemic attack (TIA), and cerebral infarction without residual deficits: Secondary | ICD-10-CM

## 2022-01-06 DIAGNOSIS — M542 Cervicalgia: Secondary | ICD-10-CM

## 2022-01-06 DIAGNOSIS — M436 Torticollis: Secondary | ICD-10-CM

## 2022-01-06 DIAGNOSIS — E7800 Pure hypercholesterolemia, unspecified: Secondary | ICD-10-CM

## 2022-01-06 LAB — POCT URINALYSIS DIP (PROADVANTAGE DEVICE)
Bilirubin, UA: NEGATIVE
Blood, UA: NEGATIVE
Glucose, UA: >=1000 mg/dL — AB
Ketones, POC UA: NEGATIVE mg/dL
Leukocytes, UA: NEGATIVE
Nitrite, UA: NEGATIVE
Protein Ur, POC: NEGATIVE mg/dL
Specific Gravity, Urine: 1.015
Urobilinogen, Ur: 0.2
pH, UA: 6 (ref 5.0–8.0)

## 2022-01-06 NOTE — Progress Notes (Signed)
Subjective:   HPI  Adrian Willis is a 66 y.o. male who presents for Chief Complaint  Patient presents with   Annual Exam    CPE fasting labs, pain in neck for about 3 weeks can't sleep well at night, staring PT Friday for his knee,     Patient Care Team: Michale Weikel, Cleda Mccreedy as PCP - General (Family Medicine) Sees dentist Dr. Tessa Lerner, cardiology Dr. Norton Blizzard, sports medicine Dr. Jettie Pagan, [facility] Dr. Otho Bellows, eye doctor   Concerns: Having some neck pain lately for about 3 weeks.  No fall, no injury.  Has had crook neck.  Stiff.  No arm pain, no numbness or tingling or weakness in arms.  Has had some recent right knee issues, starting PT soon for knee issues.   Compliant with medications listed, and reviewed today   Reviewed their medical, surgical, family, social, medication, and allergy history and updated chart as appropriate.  Past Medical History:  Diagnosis Date   BPH (benign prostatic hyperplasia)    CHF (congestive heart failure) (HCC)    CVA (cerebral vascular accident) (HCC) 2011   numbness right index finger and right thumb and right thigh   GERD (gastroesophageal reflux disease)    Hyperlipidemia    Hypertension     Past Surgical History:  Procedure Laterality Date   CARPAL TUNNEL RELEASE Right    CYSTOSCOPY WITH INSERTION OF UROLIFT     LASIK     thumb     right, nerve injury repair   TRANSURETHRAL RESECTION OF PROSTATE N/A 12/03/2019   Procedure: TRANSURETHRAL RESECTION OF THE PROSTATE (TURP), BIPOLAR;  Surgeon: Jannifer Hick, MD;  Location: [facility];  Service: Urology;  Laterality: N/A;  ONLY NEEDS 60 MIN    Family History  Problem Relation Age of Onset   Hypertension Mother    Benign prostatic hyperplasia Father    Other Father        died after horrible bed sore and demise   Cancer Sister    Heart disease Neg Hx    Stroke Neg Hx      Current Outpatient Medications:    ascorbic acid (VITAMIN C) 1000 MG  tablet, Take 1,000 mg by mouth daily., Disp: , Rfl:    ASPIRIN LOW DOSE 81 MG tablet, TAKE 1 TABLET (81 MG TOTAL) BY MOUTH DAILY. SWALLOW WHOLE., Disp: 90 tablet, Rfl: 0   dapagliflozin propanediol (FARXIGA) 10 MG TABS tablet, Take 1 tablet (10 mg total) by mouth daily before breakfast., Disp: 90 tablet, Rfl: 3   sacubitril-valsartan (ENTRESTO) 97-103 MG, Take 1 tablet by mouth 2 (two) times daily., Disp: 180 tablet, Rfl: 3   simvastatin (ZOCOR) 80 MG tablet, TAKE 1 TABLET BY MOUTH EVERY DAY, Disp: 30 tablet, Rfl: 1   nitroGLYCERIN (NITROSTAT) 0.4 MG SL tablet, Place 1 tablet (0.4 mg total) under the tongue every 5 (five) minutes as needed for chest pain. If you require more than two tablets five minutes apart go to the nearest ER via EMS., Disp: 30 tablet, Rfl: 0  Review of Systems  Constitutional:  Negative for chills, fever, malaise/fatigue and weight loss.  HENT:  Negative for congestion, ear pain, hearing loss, sore throat and tinnitus.   Eyes:  Negative for blurred vision, pain and redness.  Respiratory:  Negative for cough, hemoptysis and shortness of breath.   Cardiovascular:  Negative for chest pain, palpitations, orthopnea, claudication and leg swelling.  Gastrointestinal:  Negative for abdominal pain, blood in stool, constipation,  diarrhea, nausea and vomiting.  Genitourinary:  Negative for dysuria, flank pain, frequency, hematuria and urgency.  Musculoskeletal:  Positive for joint pain, myalgias and neck pain. Negative for falls.  Skin:  Negative for itching and rash.  Neurological:  Negative for dizziness, tingling, speech change, weakness and headaches.  Endo/Heme/Allergies:  Negative for polydipsia. Does not bruise/bleed easily.  Psychiatric/Behavioral:  Negative for depression and memory loss. The patient is not nervous/anxious and does not have insomnia.        01/06/2022   10:15 AM 01/05/2021   10:19 AM 09/23/2020   12:29 PM 07/09/2019   12:18 PM  Depression screen PHQ 2/9   Decreased Interest 0 0 0 0  Down, Depressed, Hopeless 0 0 0 0  PHQ - 2 Score 0 0 0 0        Objective:  BP 124/72   Pulse (!) 56   Temp 98.3 F (36.8 C)   Ht 5\' 8"  (1.727 m)   Wt 164 lb 12.8 oz (74.8 kg)   BMI 25.06 kg/m   Wt Readings from Last 3 Encounters:  01/06/22 164 lb 12.8 oz (74.8 kg)  12/28/21 163 lb (73.9 kg)  11/06/21 160 lb 9.6 oz (72.8 kg)    General appearance: alert, no distress, WD/WN, [place] American male Skin:  Few scattered macules throughout HEENT: normocephalic, conjunctiva/corneas normal, sclerae anicteric, PERRLA, EOMi Neck: supple, no lymphadenopathy, no thyromegaly, no masses, tender left neck and decreased left rotation and ext of neck, +spasm, no bruits Chest: non tender, normal shape and expansion Heart: RRR, normal S1, S2, no murmurs Lungs: CTA bilaterally, no wheezes, rhonchi, or rales Abdomen: +bs, soft, non tender, non distended, no masses, no hepatomegaly, no splenomegaly, no bruits Back: non tender, normal ROM, no scoliosis Musculoskeletal: upper extremities non tender, no obvious deformity, normal ROM throughout, lower extremities non tender, no obvious deformity, normal ROM throughout Extremities: no edema, no cyanosis, no clubbing Pulses: 2+ symmetric, upper and lower extremities, normal cap refill Neurological: alert, oriented x 3, CN2-12 intact, strength normal upper extremities and lower extremities, sensation normal throughout, DTRs 2+ throughout, no cerebellar signs, gait normal Psychiatric: normal affect, behavior normal, pleasant  GU: normal male external genitalia,circumcised, nontender, no masses, no hernia, no lymphadenopathy Rectal: anus normal tone, prostate firm but no specific nodule, mildly enlarged   Assessment and Plan :   Encounter Diagnoses  Name Primary?   Encounter for health maintenance examination in adult Yes   Need for Tdap vaccination    History of stroke    HFrEF (heart failure with reduced ejection  fraction) ([place])    Essential hypertension, benign    Benign prostatic hyperplasia with urinary frequency    Vaccine counseling    Pure hypercholesterolemia    Erectile dysfunction, unspecified erectile dysfunction type    Screening for prostate cancer    Torticollis    Neck pain      This visit was a preventative care visit, also known as wellness visit or routine physical.   Topics typically include healthy lifestyle, diet, exercise, preventative care, vaccinations, sick and well care, proper use of emergency dept and after hours care, as well as other concerns.     Recommendations: Continue to return yearly for your annual wellness and preventative care visits.  This gives [place] a chance to discuss healthy lifestyle, exercise, vaccinations, review your chart record, and perform screenings where appropriate.  I recommend you establish with an eye doctor and see them yearly for routine follow up.  I  recommend you see your dentist yearly for routine dental care including hygiene visits twice yearly.   Vaccination recommendations were reviewed Immunization History  Administered Date(s) Administered   [employer] Vaccination 01/24/2019, 02/21/2019, 11/22/2019   Pneumococcal Conjugate-13 07/09/2019   Tdap 01/06/2022   Zoster Recombinat (Shingrix) 08/01/2019, 10/03/2019   Just had flu shot in October, done at [facility].  Counseled on the Tdap (tetanus, diptheria, and acellular pertussis) vaccine.  Vaccine information sheet given. Tdap vaccine given after consent obtained.   Screening for cancer: Colon cancer screening: I reviewed Cologuard negative screen from 2021  We discussed PSA, prostate exam, and prostate cancer screening risks/benefits.     Skin cancer screening: Check your skin regularly for new changes, growing lesions, or other lesions of concern Come in for evaluation if you have skin lesions of concern.  Lung cancer screening: If you have a greater than 20  pack year history of tobacco use, then you may qualify for lung cancer screening with a chest CT scan.   Please call your insurance company to inquire about coverage for this test.  We currently don't have screenings for other cancers besides breast, cervical, colon, and lung cancers.  If you have a strong family history of cancer or have other cancer screening concerns, please let me know.    Bone health: Get at least 150 minutes of aerobic exercise weekly Get weight bearing exercise at least once weekly Bone density test:  A bone density test is an imaging test that uses a type of X-ray to measure the amount of calcium and other minerals in your bones. The test may be used to diagnose or screen you for a condition that causes weak or thin bones (osteoporosis), predict your risk for a broken bone (fracture), or determine how well your osteoporosis treatment is working. The bone density test is recommended for females 42 and older, or females or males [id number] if certain risk factors such as thyroid disease, long term use of steroids such as for asthma or rheumatological issues, vitamin D deficiency, estrogen deficiency, family history of osteoporosis, self or family history of fragility fracture in first degree relative.    Heart health: Get at least 150 minutes of aerobic exercise weekly Limit alcohol It is important to maintain a healthy blood pressure and healthy cholesterol numbers  Heart disease screening: Screening for heart disease includes screening for blood pressure, fasting lipids, glucose/diabetes screening, BMI height to weight ratio, reviewed of smoking status, physical activity, and diet.    Goals include blood pressure 120/80 or less, maintaining a healthy lipid/cholesterol profile, preventing diabetes or keeping diabetes numbers under good control, not smoking or using tobacco products, exercising most days per week or at least 150 minutes per week of exercise, and eating healthy  variety of fruits and vegetables, healthy oils, and avoiding unhealthy food choices like fried food, fast food, high sugar and high cholesterol foods.    Follow up with cardiology today as planned   Medical care options: I recommend you continue to seek care here first for routine care.  We try really hard to have available appointments Monday through Friday daytime hours for sick visits, acute visits, and physicals.  Urgent care should be used for after hours and weekends for significant issues that cannot wait till the next day.  The emergency department should be used for significant potentially life-threatening emergencies.  The emergency department is expensive, can often have long wait times for less significant concerns, so try to utilize  primary care, urgent care, or telemedicine when possible to avoid unnecessary trips to the emergency department.  Virtual visits and telemedicine have been introduced since the pandemic started in 2020, and can be convenient ways to receive medical care.  We offer virtual appointments as well to assist you in a variety of options to seek medical care.   Advanced Directives: I recommend you consider completing a [place] and Living Will.   These documents respect your wishes and help alleviate burdens on your loved ones if you were to become terminally ill or be in a position to need those documents enforced.    You can complete Advanced Directives yourself, have them notarized, then have copies made for our office, for you and for anybody you feel should have them in safe keeping.  Or, you can have an attorney prepare these documents.   If you haven't updated your Last Will and Testament in a while, it may be worthwhile having an attorney prepare these documents together and save on some costs.       Separate significant issues discussed: HTN , Heart failure - continue current therapy and follow up with cardiology  Hypercholesteremia  - continue statin, labs today  Torticollis - begin muscle relaxer QHS prn, caution on sedation, can use heat and ice alternating,   I added this to his physical therapy order.  He sees PT Friday in 2 day s  Right knee pain - starts PT in 2 days    Loel was seen today for annual exam.  Diagnoses and all orders for this visit:  Encounter for health maintenance examination in adult -     Comprehensive metabolic panel -     CBC -     Lipid panel -     PSA -     POCT Urinalysis DIP (Proadvantage Device)  Need for Tdap vaccination -     Tdap vaccine greater than or equal to 7yo IM  History of stroke  HFrEF (heart failure with reduced ejection fraction) (HCC)  Essential hypertension, benign  Benign prostatic hyperplasia with urinary frequency  Vaccine counseling  Pure hypercholesterolemia -     Lipid panel  Erectile dysfunction, unspecified erectile dysfunction type  Screening for prostate cancer -     PSA  Torticollis  Neck pain    Follow-up pending labs, yearly for physical

## 2022-01-07 ENCOUNTER — Other Ambulatory Visit: Payer: Self-pay | Admitting: Medical

## 2022-01-07 DIAGNOSIS — Z8673 Personal history of transient ischemic attack (TIA), and cerebral infarction without residual deficits: Secondary | ICD-10-CM

## 2022-01-07 LAB — CBC
Hematocrit: 49.1 % (ref 37.5–51.0)
Hemoglobin: 15.7 g/dL (ref 13.0–17.7)
MCH: 27.5 pg (ref 26.6–33.0)
MCHC: 32 g/dL (ref 31.5–35.7)
MCV: 86 fL (ref 79–97)
Platelets: 184 10*3/uL (ref 150–450)
RBC: 5.7 x10E6/uL (ref 4.14–5.80)
RDW: 12.8 % (ref 11.6–15.4)
WBC: 4.1 10*3/uL (ref 3.4–10.8)

## 2022-01-07 LAB — COMPREHENSIVE METABOLIC PANEL
ALT: 38 IU/L (ref 0–44)
AST: 33 IU/L (ref 0–40)
Albumin/Globulin Ratio: 1.5 (ref 1.2–2.2)
Albumin: 4.3 g/dL (ref 3.9–4.9)
Alkaline Phosphatase: 60 IU/L (ref 44–121)
BUN/Creatinine Ratio: 9 — ABNORMAL LOW (ref 10–24)
BUN: 9 mg/dL (ref 8–27)
Bilirubin Total: 0.5 mg/dL (ref 0.0–1.2)
CO2: 25 mmol/L (ref 20–29)
Calcium: 9.1 mg/dL (ref 8.6–10.2)
Chloride: 101 mmol/L (ref 96–106)
Creatinine, Ser: 1.03 mg/dL (ref 0.76–1.27)
Globulin, Total: 2.8 g/dL (ref 1.5–4.5)
Glucose: 86 mg/dL (ref 70–99)
Potassium: 4.1 mmol/L (ref 3.5–5.2)
Sodium: 139 mmol/L (ref 134–144)
Total Protein: 7.1 g/dL (ref 6.0–8.5)
eGFR: 80 mL/min/{1.73_m2} (ref >59)

## 2022-01-07 LAB — LIPID PANEL
Chol/HDL Ratio: 2.5 ratio (ref 0.0–5.0)
Cholesterol, Total: 151 mg/dL (ref 100–199)
HDL: 61 mg/dL (ref >39)
LDL Chol Calc (NIH): 67 mg/dL (ref 0–99)
Triglycerides: 134 mg/dL (ref 0–149)
VLDL Cholesterol Cal: 23 mg/dL (ref 5–40)

## 2022-01-07 LAB — PSA: Prostate Specific Ag, Serum: 0.9 ng/mL (ref 0.0–4.0)

## 2022-01-07 MED ORDER — TIZANIDINE HCL 4 MG PO TABS: 4.0000 mg | ORAL_TABLET | Freq: Two times a day (BID) | ORAL | 0 refills | Status: DC | PRN

## 2022-01-07 MED ORDER — SIMVASTATIN 80 MG PO TABS: 80.0000 mg | ORAL_TABLET | Freq: Every day | ORAL | 3 refills | Status: DC

## 2022-01-07 MED ORDER — ASPIRIN 81 MG PO TBEC: 81.0000 mg | DELAYED_RELEASE_TABLET | Freq: Every day | ORAL | 3 refills | Status: DC

## 2022-01-07 NOTE — Progress Notes (Signed)
Your labs show normal blood sugar, liver, kidney, electrolytes, blood counts, cholesterol, prostate.  All labs basically at goal  Continue current medication  I sent a medicine called tizanidine muscle laxer to use either in the evening or up to twice daily if you are going to be at home.  Caution as this can cause sedation.  This should help with some of the neck tension.  When you see physical therapy remind them about the knee and the neck.  We called over to asked them to add therapy for the neck as well

## 2022-01-07 NOTE — Therapy (Addendum)
OUTPATIENT PHYSICAL THERAPY CERVICAL/KNEE EVALUATION /DISCHARGE   Patient Name: Adrian Willis MRN: [id number] DOB:1955-02-06, 66 y.o., male Today's Date: 01/08/2022  END OF SESSION:  PT End of Session - 01/08/22 0902     Visit Number 1    Number of Visits 4    Date for PT Re-Evaluation 03/05/22    Authorization Type [employer]    PT Start Time 0805    PT Stop Time 0849    PT Time Calculation (min) 44 min    Activity Tolerance Patient tolerated treatment well    Behavior During Therapy [facility] for tasks assessed/performed             Past Medical History:  Diagnosis Date   BPH (benign prostatic hyperplasia)    CHF (congestive heart failure) (HCC)    CVA (cerebral vascular accident) (HCC) 2011   numbness right index finger and right thumb and right thigh   GERD (gastroesophageal reflux disease)    Hyperlipidemia    Hypertension    Past Surgical History:  Procedure Laterality Date   CARPAL TUNNEL RELEASE Right    CYSTOSCOPY WITH INSERTION OF UROLIFT     LASIK     thumb     right, nerve injury repair   TRANSURETHRAL RESECTION OF PROSTATE N/A 12/03/2019   Procedure: TRANSURETHRAL RESECTION OF THE PROSTATE (TURP), BIPOLAR;  Surgeon: Jannifer Hick, MD;  Location: [facility];  Service: Urology;  Laterality: N/A;  ONLY NEEDS 60 MIN   Patient Active Problem List   Diagnosis Date Noted   Encounter for health maintenance examination in adult 08/11/2020   Screening for prostate cancer 08/11/2020   HFrEF (heart failure with reduced ejection fraction) (HCC) 08/11/2020   Hyperlipidemia 07/09/2019   Screen for colon cancer 07/09/2019   Need for pneumococcal vaccination 07/09/2019   Vaccine counseling 07/09/2019   Ataxia 05/24/2019   Essential hypertension, benign 05/24/2019   History of stroke 05/24/2019   Benign prostatic hyperplasia with urinary frequency 05/24/2019   Erectile dysfunction 05/24/2019    PCP: Jac Canavan, PA-C   REFERRING PROVIDER: Ronnald Nian,  MD  REFERRING DIAG: M54.2 (ICD-10-CM) - Neck pain M25.561 (ICD-10-CM) - Acute pain of right knee  THERAPY DIAG:  Cervicalgia  Acute pain of right knee  Localized edema  Difficulty in walking, not elsewhere classified  Rationale for Evaluation and Treatment: Rehabilitation  ONSET DATE: 2 week onset of pain  SUBJECTIVE:  SUBJECTIVE STATEMENT: He has 2 week  history of left-sided neck pain that does radiate into the shoulder.  He states that he woke up 1 day with this and could not turn his head.   No numbness, tingling or weakness in the arms.  The pain is worse with rotation of the neck.  no history of previous injury or overuse.  No popping, locking or grinding. He does report he is getting better lately   PERTINENT HISTORY:  CVE:LFYBOFB Rt shoulder RTC tear,CHF,CVA  PAIN:  Are you having pain? Yes: NPRS scale: 4 in neck and 3 in knee/10 Pain location: Lt neck, and Rt knee anterior-lateral knee Pain description: d Aggravating factors: knee: sittting for a long time, sit to stand, standing for a long time Relieving factors: ice, rest  PRECAUTIONS: None  WEIGHT BEARING RESTRICTIONS: No  FALLS:  Has patient fallen in last 6 months? No  OCCUPATION: Administrator, arts  PLOF: Independent  PATIENT GOALS: reduce pain  NEXT MD VISIT:   OBJECTIVE:   DIAGNOSTIC FINDINGS:  No recent imaging  PATIENT SURVEYS:  FOTO eval: 49% functional  COGNITION: Overall cognitive status: Within functional limits for tasks assessed  SENSATION: WFL  EDEMA: mild to moderate edema in Rt knee.  POSTURE: No Significant postural limitations  PALPATION:TTP in C7-T2 on left, TTP in Rt hamstring tendons    CERVICAL ROM:   Active ROM AROM (deg) eval  Flexion 75% mild pain  Extension  75%  Right lateral flexion 50% mod pain  Left lateral flexion 25% most pain  Right rotation WFL no pain  Left rotation 75% mod pain   (Blank rows = not tested)  UPPER/LOWER EXTREMITY ROM:  Active ROM Right eval Left eval  Shoulder flexion [facility]  Shoulder extension    Shoulder abduction [facility]  Shoulder adduction    Shoulder extension    Shoulder internal rotation    Shoulder external rotation    Elbow flexion    Elbow extension        Knee flexion WNL WNL  Knee extension WNL WNL   (Blank rows = not tested)  UPPER/LOWER  EXTREMITY MMT:  MMT Right eval Left eval  Shoulder flexion    Shoulder extension    Shoulder abduction    Shoulder adduction    Shoulder extension    Shoulder internal rotation    Shoulder external rotation    Middle trapezius    Lower trapezius    Elbow flexion    Elbow extension    Grip strength        Knee flexion 5   Knee extension 5    (Blank rows = not tested)  CERVICAL SPECIAL TESTS:  Spurling's test: Negative  FUNCTIONAL TESTS:    TODAY'S TREATMENT:  Eval HEP creation and review with demonstration and trial set preformed, see below for details Manual therapy for mobilization with movement to T1-2 moving into cervical side bend and rotation. skilled palpation and Trigger Point Dry-Needling  Treatment instructions: Expect mild to moderate muscle soreness. Patient Consent Given: Yes Education handout provided: verbally provided Muscles treated: Cervical-thoracic paraspinals and multifidi, upper traps, levator, suboccipitals Treatment response/outcome: good overall tolerance,twitch response noted   IFC TENS to neck X 12 min and Vasopnuematic device X 10 min, medium compression, 34 deg to Rt knee     PATIENT EDUCATION: Education details: HEP, PT plan of care Person educated: Patient Education method: Explanation, Demonstration, Verbal cues, and Handouts Education comprehension: verbalized understanding and needs further  education  HOME EXERCISE PROGRAM: Access Code: [email or web address] URL: [email or web address] Date: 01/08/2022 Prepared by: Ivery Quale  Exercises - Seated Scapular Retraction  - 2 x daily - 6 x weekly - 1 sets - 10 reps - 5 sec hold - Seated Assisted Cervical Rotation with Towel  - 2 x daily - 6 x weekly - 1 sets - 10 reps - 5 sec hold - Seated Cervical Sidebending Stretch (Mirrored)  - 2 x daily - 6 x weekly - 1 sets - 2-3 reps - 20 sec hold - Seated Levator Scapulae Stretch  - 2 x daily - 6 x weekly - 2-3 reps - 20 sec hold - Seated Straight Leg Raise with Quad Contraction  - 2 x daily - 6 x weekly - 1-2 sets - 10 reps - Seated Hamstring Stretch  - 2 x daily - 6 x weekly - 1 sets - 2 reps - 30 hold - Sit to Stand with Armchair  - 2 x daily - 6 x weekly - 1-2 sets - 5 reps  Patient Education - TENS Therapy  ASSESSMENT:  CLINICAL IMPRESSION: Patient referred to PT for acute neck pain/strain with symptoms of facet arthropathy and acute Rt knee pain with symptoms of hamstring strain .Patient will benefit from skilled PT to address below impairments, limitations and improve overall function.  OBJECTIVE IMPAIRMENTS: decreased activity tolerance, decreased  mobility, decreased ROM, decreased strength, impaired flexibility, impaired UE/LE use, postural dysfunction, and pain.  ACTIVITY LIMITATIONS: reaching, lifting, carry,  cleaning, driving, and or occupation  PERSONAL FACTORS:  also affecting patient's functional outcome.  REHAB POTENTIAL: Good  CLINICAL DECISION MAKING: Stable/uncomplicated  EVALUATION COMPLEXITY: Low    GOALS: Short term PT Goals Target date: 02/05/2022   Pt will be I and compliant with HEP. Baseline:  Goal status: New Pt will decrease pain by 25% overall Baseline: Goal status: New  Long term PT goals Target date:03/05/2022   Pt will improve neck AROM to [facility] to improve functional scanning Baseline: Goal status: New Pt will improve FOTO to at  least 60% functional to show improved function Baseline: Goal status: New Pt will reduce pain to overall 2/10 or less with usual activity and work activity. Baseline: Goal status: New  PLAN: PT FREQUENCY: 1 times per week   PT DURATION: 8 weeks  PLANNED INTERVENTIONS (unless contraindicated): aquatic PT, Canalith repositioning, cryotherapy, Electrical stimulation, Iontophoresis with 4 mg/ml dexamethasome, Moist heat, traction, Ultrasound, gait training, Therapeutic exercise, balance training, neuromuscular re-education, patient/family education, prosthetic training, manual techniques, passive ROM, dry needling, taping, vasopnuematic device, vestibular, spinal manipulations, joint manipulations  PLAN FOR NEXT SESSION: review HEP, DN and or TENS if preferred. Test UE strength   April Manson, PT,DPT 01/08/2022, 9:03 AM   PHYSICAL THERAPY DISCHARGE SUMMARY  Visits from Start of Care: 1  Current functional level related to goals / functional outcomes: See note   Remaining deficits: See note   Education / Equipment: HEP  Patient goals were not met. Patient is being discharged due to not returning since the last visit.  Chyrel Masson, PT, DPT, OCS, ATC 02/12/22  8:26 AM

## 2022-01-08 ENCOUNTER — Other Ambulatory Visit: Payer: Self-pay

## 2022-01-08 ENCOUNTER — Ambulatory Visit: Payer: 59 | Admitting: Physical Therapy

## 2022-01-08 ENCOUNTER — Encounter: Payer: Self-pay | Admitting: Physical Therapy

## 2022-01-08 DIAGNOSIS — M25561 Pain in right knee: Secondary | ICD-10-CM

## 2022-01-08 DIAGNOSIS — R600 Localized edema: Secondary | ICD-10-CM | POA: Diagnosis not present

## 2022-01-08 DIAGNOSIS — M542 Cervicalgia: Secondary | ICD-10-CM

## 2022-01-08 DIAGNOSIS — R262 Difficulty in walking, not elsewhere classified: Secondary | ICD-10-CM | POA: Diagnosis not present

## 2022-01-22 ENCOUNTER — Telehealth: Payer: Self-pay | Admitting: Medical

## 2022-01-22 ENCOUNTER — Other Ambulatory Visit: Payer: Self-pay | Admitting: Medical

## 2022-01-22 MED ORDER — BETAMETHASONE DIPROPIONATE 0.05 % EX CREA: TOPICAL_CREAM | Freq: Every day | CUTANEOUS | 0 refills | Status: DC | PRN

## 2022-01-22 NOTE — Telephone Encounter (Signed)
Pt called and states he told you he did not need anymore betamethasone-dipropionate but he does need it. Can use [id number] [place], [place] - [address]

## 2022-02-05 ENCOUNTER — Telehealth: Payer: Self-pay | Admitting: Physical Therapy

## 2022-02-05 ENCOUNTER — Encounter: Payer: 59 | Admitting: Physical Therapy

## 2022-02-05 NOTE — Telephone Encounter (Signed)
Pt did not show for PT appointment today. They were contacted and informed of this via voicemail. They did not have any more appointments so they were instructed to call us back to set up any more if they still feel like they need PT.  Elsie Ra, PT, DPT 02/05/22 9:12 AM

## 2022-02-19 ENCOUNTER — Ambulatory Visit: Payer: No Typology Code available for payment source | Admitting: Medical

## 2022-02-19 VITALS — BP 120/80 | HR 80 | Temp 97.7°F | Resp 16 | Wt 158.8 lb

## 2022-02-19 DIAGNOSIS — M62830 Muscle spasm of back: Secondary | ICD-10-CM

## 2022-02-19 DIAGNOSIS — J019 Acute sinusitis, unspecified: Secondary | ICD-10-CM | POA: Diagnosis not present

## 2022-02-19 DIAGNOSIS — R062 Wheezing: Secondary | ICD-10-CM | POA: Diagnosis not present

## 2022-02-19 DIAGNOSIS — M545 Low back pain, unspecified: Secondary | ICD-10-CM

## 2022-02-19 DIAGNOSIS — R052 Subacute cough: Secondary | ICD-10-CM

## 2022-02-19 MED ORDER — DM-GUAIFENESIN ER 30-600 MG PO TB12: 1.0000 | ORAL_TABLET | Freq: Two times a day (BID) | ORAL | 0 refills | Status: DC

## 2022-02-19 MED ORDER — AMOXICILLIN 875 MG PO TABS: 875.0000 mg | ORAL_TABLET | Freq: Two times a day (BID) | ORAL | 0 refills | Status: AC

## 2022-02-19 MED ORDER — TIZANIDINE HCL 4 MG PO TABS: 4.0000 mg | ORAL_TABLET | Freq: Every evening | ORAL | 0 refills | Status: DC | PRN

## 2022-02-19 MED ORDER — ALBUTEROL SULFATE HFA 108 (90 BASE) MCG/ACT IN AERS: 2.0000 | INHALATION_SPRAY | Freq: Four times a day (QID) | RESPIRATORY_TRACT | 0 refills | Status: DC | PRN

## 2022-02-19 NOTE — Patient Instructions (Signed)
Fortunately your lungs sound clear today but you do have thicker mucus in your nasal passages and behind your eardrums and the sinuses  Recommendations: Begin a course of amoxicillin antibiotic twice a day for 10 days I recommend you try some Mucinex DM twice daily for 5 days to help with cough and congestion.  I sent this to the pharmacy but it is available over-the-counter.  This is safe for you to take Since you had some wheezing at night, if desired you can use an albuterol inhaler.  Albuterol inhaler is a medication to help open up the air sacs in the lungs which can help with shortness of breath, wheezing, cough, mucus plugging.  It will make you feel little jittery after use to be aware of this. Make sure you are drinking plenty of water and clear fluids over the next few days to help clear out mucus You can continue honey/lemon mixture Consider salt water gargles to get some of the mucus out the back of your throat Consider nasal saline flush to flush out mucus from the nasal passages Since you have been stopped up in the nose at night, if needed you can try over-the-counter Flonase allergy medicine to reduce swelling or congestion in the nostrils before you go to bed  Avoid over-the-counter decongestants both nasal and oral as they can interfere with the heart rate and blood pressure  Using Saline Nose Drops with Bulb Syringe A bulb syringe is used to clear your nose. You may use it when you have a stuffy nose, nasal congestion, sinus pressure, or sneezing.   SALINE SOLUTION You can buy nose drops at your local drug store. You can also make nose drops yourself. Mix 1 cup of water with  teaspoon of salt. Stir. Store this mixture at room temperature. Make a new batch daily.  USE THE BULB IN COMBINATION WITH SALINE NOSE DROPS Squeeze the air out of the bulb before suctioning the saline mixture. While still squeezing the bulb flat, place the tip of the bulb into the saline mixture.  Let  air come back into the bulb.  This will suction up the saline mixture. Gently flush one nostril at a time. Salt water nose drops will then moisten your  congested nose and loosen secretions before suctioning. Use the bulb syringe as directed below to suction.  USING THE BULB SYRINGE TO SUCTION While still squeezing the bulb flat, place the tip of the bulb into a nostril. Let air come back into the bulb. The suction will pull snot out of the nose and into the bulb. Repeat on the other nostril. Squeeze syringe several times into a tissue.  CLEANING THE BULB SYRINGE Clean the bulb syringe every day with hot soapy water. Clean the inside of the bulb by squeezing the bulb while the tip is in soapy water. Rinse by squeezing the bulb while the tip is in clean hot water. Store the bulb with the tip side down on paper towel.  HOME CARE INSTRUCTIONS  Use saline nose drops often to keep the nose open and not stuffy. Throw away used salt water. Make a new solution every time. Do not use the same solution and dropper for another person If you do not prefer to use nasal saline flush, other options include nasal saline spray or the [employer], both of which are available over the counter at your pharmacy.

## 2022-02-19 NOTE — Progress Notes (Signed)
Subjective:  Adrian Willis is a 67 y.o. male who presents for Chief Complaint  Patient presents with   wheezing and lower back pain    Wheezing when laying down since 02/09/22- cough for 2 weeks, and congestion- runny nose-  and lower back pain-bending over to clean- happen Wednesday and pain will go down leg.      Here for ongoing congestion.  Started with cold symptoms 2 weeks ago, had stuffy nose, congestion, cough, couldn't sleep well.   Was using mixture of honey and lime juice.  Was in cold weather 2 weeks ago while in [place], attributed some of the symptoms to being in the colder climate at that time.  Still has some ongoing concerns, cough, congestion, sounds in his chest/wheeze, particularly left side.   No fever in last week, has had some chills.  2 days ago had some blood tinged mucous from nose.  Has had some productive cough but some clear as well.  Has had some sick contacts in recent weeks.  Had been using some OTC cough medications.  No hx/o asthma.  When lying down recently hears wheeze.    He also reports some low back pain on the right.  Been going on last several days.  He may have pulled or turned a certain way lifting something off the floor.  He also drove back from [place] yesterday.  No pain down the legs.  No numbness or ting or weakness.  No fever.  No incontinence.  No other aggravating or relieving factors.    No other c/o.  Past Medical History:  Diagnosis Date   BPH (benign prostatic hyperplasia)    CHF (congestive heart failure) (HCC)    CVA (cerebral vascular accident) ([place]) 2011   numbness right index finger and right thumb and right thigh   GERD (gastroesophageal reflux disease)    Hyperlipidemia    Hypertension    Current Outpatient Medications on File Prior to Visit  Medication Sig Dispense Refill   ascorbic acid (VITAMIN C) 1000 MG tablet Take 1,000 mg by mouth daily.     aspirin EC (ASPIRIN LOW DOSE) 81 MG tablet Take 1 tablet (81 mg total)  by mouth 5 (five) times daily. SWALLOW WHOLE. 90 tablet 3   betamethasone dipropionate 0.05 % cream Apply topically daily as needed. 45 g 0   dapagliflozin propanediol (FARXIGA) 10 MG TABS tablet Take 1 tablet (10 mg total) by mouth daily before breakfast. 90 tablet 3   sacubitril-valsartan (ENTRESTO) 97-103 MG Take 1 tablet by mouth 2 (two) times daily. 180 tablet 3   simvastatin (ZOCOR) 80 MG tablet Take 1 tablet (80 mg total) by mouth daily. 90 tablet 3   nitroGLYCERIN (NITROSTAT) 0.4 MG SL tablet Place 1 tablet (0.4 mg total) under the tongue every 5 (five) minutes as needed for chest pain. If you require more than two tablets five minutes apart go to the nearest ER via EMS. 30 tablet 0   tiZANidine (ZANAFLEX) 4 MG tablet Take 1 tablet (4 mg total) by mouth 2 (two) times daily as needed for muscle spasms. (Patient not taking: Reported on 02/19/2022) 20 tablet 0   No current facility-administered medications on file prior to visit.     The following portions of the patient's history were reviewed and updated as appropriate: allergies, current medications, past family history, past medical history, past social history, past surgical history and problem list.  ROS Otherwise as in subjective above    Objective: BP 120/80  Pulse 80   Temp 97.7 F (36.5 C)   Resp 16   Wt 158 lb 12.8 oz (72 kg)   SpO2 98%   BMI 24.15 kg/m   General appearance: alert, no distress, well developed, well nourished HEENT: normocephalic, sclerae anicteric, conjunctiva pink and moist, air-fluid levels behind the TMs, nares with mucoid discharge swollen turbinates and erythema, pharynx with mucoid drainage and erythema Oral cavity: MMM, no lesions Neck: supple, no lymphadenopathy, no thyromegaly, no masses Heart: RRR, normal S1, S2, no murmurs Lungs: CTA bilaterally, no wheezes, rhonchi, or rales Back tenderness of lumbar spine paraspinal, otherwise nontender, range of motion decreased in general Legs  nontender Neuro: Negative straight leg raise, normal strength of legs normal sensation DTRs Pulses: 2+ radial pulses, 2+ pedal pulses, normal cap refill Ext: no edema    Assessment: Encounter Diagnoses  Name Primary?   Wheezing Yes   Acute non-recurrent sinusitis, unspecified location    Subacute cough    Acute right-sided low back pain without sciatica    Back spasm      Plan: Begin amoxicillin for sinus congestion, cough and congestion residual from 2 weeks ago, albuterol as needed, Mucinex DM, plenty water intake.  If not much improved over the next 3 to 4 days then let me know  Back spasm, back pain and strain-advise relative rest, stretching, range of motion activity, heat and massage, can use a few days of over-the-counter Aleve, can use tizanidine once or twice a day for the next few days.  Caution on sedation.  If not resolved within the next week then let me know  Adrian Willis was seen today for wheezing and lower back pain.  Diagnoses and all orders for this visit:  Wheezing  Acute non-recurrent sinusitis, unspecified location  Subacute cough  Acute right-sided low back pain without sciatica  Back spasm  Other orders -     dextromethorphan-guaiFENesin (MUCINEX DM) 30-600 MG 12hr tablet; Take 1 tablet by mouth 2 (two) times daily. -     amoxicillin (AMOXIL) 875 MG tablet; Take 1 tablet (875 mg total) by mouth 2 (two) times daily for 10 days. -     albuterol (VENTOLIN HFA) 108 (90 Base) MCG/ACT inhaler; Inhale 2 puffs into the lungs every 6 (six) hours as needed for wheezing or shortness of breath. -     tiZANidine (ZANAFLEX) 4 MG tablet; Take 1 tablet (4 mg total) by mouth at bedtime as needed for muscle spasms.    Follow up: As needed

## 2022-03-04 ENCOUNTER — Telehealth: Payer: Self-pay

## 2022-03-04 DIAGNOSIS — I5020 Unspecified systolic (congestive) heart failure: Secondary | ICD-10-CM

## 2022-03-04 MED ORDER — DAPAGLIFLOZIN PROPANEDIOL 10 MG PO TABS: 10.0000 mg | ORAL_TABLET | Freq: Every day | ORAL | 3 refills | Status: DC

## 2022-03-04 NOTE — Telephone Encounter (Signed)
Patient called requesting samples.

## 2022-03-15 ENCOUNTER — Other Ambulatory Visit: Payer: Self-pay | Admitting: Medical

## 2022-05-06 ENCOUNTER — Ambulatory Visit: Payer: No Typology Code available for payment source | Admitting: Cardiology

## 2022-05-14 ENCOUNTER — Encounter: Payer: Self-pay | Admitting: Cardiology

## 2022-05-14 ENCOUNTER — Ambulatory Visit: Payer: No Typology Code available for payment source | Admitting: Cardiology

## 2022-05-14 VITALS — BP 127/83 | HR 65 | Resp 16 | Ht 68.0 in | Wt 161.8 lb

## 2022-05-14 DIAGNOSIS — I5020 Unspecified systolic (congestive) heart failure: Secondary | ICD-10-CM

## 2022-05-14 DIAGNOSIS — I10 Essential (primary) hypertension: Secondary | ICD-10-CM

## 2022-05-14 DIAGNOSIS — I429 Cardiomyopathy, unspecified: Secondary | ICD-10-CM

## 2022-05-14 DIAGNOSIS — E782 Mixed hyperlipidemia: Secondary | ICD-10-CM

## 2022-05-14 DIAGNOSIS — R002 Palpitations: Secondary | ICD-10-CM

## 2022-05-14 MED ORDER — METOPROLOL SUCCINATE ER 25 MG PO TB24: 12.5000 mg | ORAL_TABLET | Freq: Every morning | ORAL | 0 refills | Status: DC

## 2022-05-14 NOTE — Progress Notes (Signed)
ID:  Adrian Willis, DOB 1955-04-20, MRN [id number]  PCP:  Jac Canavan, PA-C  Cardiologist:  Tessa Lerner, DO, [facility] (established care 06/20/2019)  Date: 05/14/22 Last Office Visit: 11/06/2021  Chief Complaint  Patient presents with   Cardiomyopathy   HFrEF   Follow-up    6 months    HPI  Adrian Willis is a 67 y.o. male whose past medical history and cardiovascular risk factors include history of stroke 2011, hypertension, hyperlipidemia, and heart failure with reduced EF, stage B, NYHA class II, advanced age.    Patient is being followed by the practice for HFpEF.  Echocardiogram in the past and noted moderately reduced LVEF and ischemic workup illustrated a small sized, mild intensity, perfusion defect involving the RCA distribution.  However, invasive angiography or coronary CTA was not undertaken as per patient's wishes since he was asymptomatic and overall functional capacity is preserved.  Since then his medical therapy has been uptitrated and now presents for 85-month follow-up visit.  No use of sublingual nitroglycerin tablets.  Overall functional capacity remains stable.  No longer on Toprol-XL as he ran out of prescription.  No hospitalizations or urgent care visits for cardiovascular reasons.  He enjoys working and does tae bo regularly.  At the last office visit we decided to proceed with a cardiac MRI given his underlying cardiomyopathy; however, this is still pending.  FUNCTIONAL STATUS: Regularly does taebo.   ALLERGIES: No Known Allergies  MEDICATION LIST PRIOR TO VISIT: Current Meds  Medication Sig   metoprolol succinate (TOPROL XL) 25 MG 24 hr tablet Take 0.5 tablets (12.5 mg total) by mouth every morning. Hold if systolic blood pressure (top number) less than 100 mmHg or pulse less than 60 bpm.     PAST MEDICAL HISTORY: Past Medical History:  Diagnosis Date   BPH (benign prostatic hyperplasia)    CHF (congestive heart failure) (HCC)    CVA  (cerebral vascular accident) (HCC) 2011   numbness right index finger and right thumb and right thigh   GERD (gastroesophageal reflux disease)    Hyperlipidemia    Hypertension     PAST SURGICAL HISTORY: Past Surgical History:  Procedure Laterality Date   CARPAL TUNNEL RELEASE Right    CYSTOSCOPY WITH INSERTION OF UROLIFT     LASIK     thumb     right, nerve injury repair   TRANSURETHRAL RESECTION OF PROSTATE N/A 12/03/2019   Procedure: TRANSURETHRAL RESECTION OF THE PROSTATE (TURP), BIPOLAR;  Surgeon: Jannifer Hick, MD;  Location: [facility];  Service: Urology;  Laterality: N/A;  ONLY NEEDS 60 MIN    FAMILY HISTORY: The patient family history includes Benign prostatic hyperplasia in his father; Cancer in his sister; Hypertension in his mother; Other in his father.  SOCIAL HISTORY:  The patient  reports that he has never smoked. He has never used smokeless tobacco. He reports that he does not drink alcohol and does not use drugs.  REVIEW OF SYSTEMS: Review of Systems  Cardiovascular:  Negative for chest pain, claudication, dyspnea on exertion, irregular heartbeat, leg swelling, near-syncope, orthopnea, palpitations, paroxysmal nocturnal dyspnea and syncope.  Respiratory:  Negative for shortness of breath.   Hematologic/Lymphatic: Negative for bleeding problem.  Musculoskeletal:  Negative for muscle cramps and myalgias.  Neurological:  Negative for dizziness and light-headedness.    PHYSICAL EXAM:    05/14/2022   11:48 AM 02/19/2022   12:01 PM 01/06/2022   10:15 AM  Vitals with BMI  Height  5\' 8"   5\' 8"   Weight 161 lbs 13 oz 158 lbs 13 oz 164 lbs 13 oz  BMI 24.61  25.06  Systolic 127 120 161  Diastolic 83 80 72  Pulse 65 80 56   Physical Exam  Constitutional: No distress.  Age appropriate, hemodynamically stable.   Neck: No JVD present.  Cardiovascular: Normal rate, regular rhythm, S1 normal, S2 normal, intact distal pulses and normal pulses. Exam reveals no gallop, no S3  and no S4.  No murmur heard. Pulmonary/Chest: Effort normal and breath sounds normal. No stridor. He has no wheezes. He has no rales.  Abdominal: Soft. Bowel sounds are normal. He exhibits no distension. There is no abdominal tenderness.  Musculoskeletal:        General: No edema.     Cervical back: Neck supple.  Neurological: He is alert and oriented to person, place, and time. He has intact cranial nerves (2-12).  Skin: Skin is warm and moist.   CARDIAC DATABASE: EKG: May 14, 2022 sinus rhythm, 63 bpm,, without underlying ischemia or injury pattern.  Echocardiogram: 06/27/2019: Moderate global hypokinesis. LVEF 35-40%.  Grade I (impaired) diastolic dysfunction, normal LAP. Mild TR.   01/05/2021:  Left ventricle cavity is normal in size. Normal left ventricular wall thickness. Normal diastolic filling pattern. Hypokinetic global wall motion. Moderately depressed LV systolic function with visual EF 35-40%. Left atrial cavity is mildly dilated. Structurally normal trileaflet aortic valve. Trace aortic regurgitation. Structurally normal tricuspid valve. No evidence of tricuspid stenosis. Mild tricuspid regurgitation. No significant change from 06/27/2019. (On personal review grade 1 diastolic impairment).   Stress Testing: Lexiscan Sestamibi Stress Test 08/15/2019:  Nondiagnostic ECG stress.  Myocardial perfusion is normal.  There is a fixed mild defect in the inferior region suggestive of diaphragmatic attenuation.  Normal LV size. Overall LV systolic function is abnormal without regional wall motion abnormalities. Stress LV EF: 47%.  No previous exam available for comparison. Low risk.  Heart Catheterization: None  Event monitor 06/27/2019 - 07/11/2019: Diagnostic time: 100%  Dominant rhythm: Normal Sinus. HR 51-154 bpm. Avg HR 80 bpm. No atrial fibrillation, high grade AV block, or sinus pause >3sec noted. Heart rate >100bpm for approximately 9% of the monitoring period.   Heart rate <60bpm for <1% of the monitoring period.  Total ventricular ectopy <1%.  No patient triggered events recorded.   Carotid Duplex 06/27/2019: No hemodynamically significant arterial disease in the internal carotid artery bilaterally. Minimal increased intimal thickness bilaterally. Antegrade right vertebral artery flow. Antegrade left vertebral artery flow.  LABORATORY DATA:    Latest Ref Rng & Units 01/06/2022   10:55 AM 02/27/2021   10:28 AM 01/05/2021   11:08 AM  CBC  WBC 3.4 - 10.8 x10E3/uL 4.1  3.7  4.6   Hemoglobin 13.0 - 17.7 g/dL 09.6  04.5  40.9   Hematocrit 37.5 - 51.0 % 49.1  46.6  49.6   Platelets 150 - 450 x10E3/uL 184  191  200        Latest Ref Rng & Units 01/06/2022   10:55 AM 02/27/2021   10:28 AM 01/05/2021   11:08 AM  CMP  Glucose 70 - 99 mg/dL 86  811  86   BUN 8 - 27 mg/dL 9  9  9    Creatinine 0.76 - 1.27 mg/dL 9.14  7.82  9.56   Sodium 134 - 144 mmol/L 139  139  141   Potassium 3.5 - 5.2 mmol/L 4.1  3.9  4.8   Chloride  96 - 106 mmol/L 101  104  101   CO2 20 - 29 mmol/L 25  28  22    Calcium 8.6 - 10.2 mg/dL 9.1  9.4  9.3   Total Protein 6.0 - 8.5 g/dL 7.1   7.2   Total Bilirubin 0.0 - 1.2 mg/dL 0.5   0.4   Alkaline Phos 44 - 121 IU/L 60   62   AST 0 - 40 IU/L 33   24   ALT 0 - 44 IU/L 38   23     Lipid Panel     Component Value Date/Time   CHOL 151 01/06/2022 1055   TRIG 134 01/06/2022 1055   HDL 61 01/06/2022 1055   CHOLHDL 2.5 01/06/2022 1055   LDLCALC 67 01/06/2022 1055   LABVLDL 23 01/06/2022 1055    No results found for: "HGBA1C" No components found for: "NTPROBNP" Lab Results  Component Value Date   TSH 0.576 05/24/2019    BMP Recent Labs    01/06/22 1055  NA 139  K 4.1  CL 101  CO2 25  GLUCOSE 86  BUN 9  CREATININE 1.03  CALCIUM 9.1   BNP (last 3 results) No results for input(s): "PROBNP" in the last 8760 hours.   TSH No results for input(s): "TSH" in the last 8760 hours.   CHOLESTEROL Recent Labs     01/06/22 1055  CHOL 151     Hepatic Function Panel Recent Labs    01/06/22 1055  PROT 7.1  ALBUMIN 4.3  AST 33  ALT 38  ALKPHOS 60  BILITOT 0.5     IMPRESSION:    ICD-10-CM   1. HFrEF (heart failure with reduced ejection fraction) (HCC)  I50.20 EKG 12-Lead    metoprolol succinate (TOPROL XL) 25 MG 24 hr tablet    2. Essential hypertension  I10     3. Mixed hyperlipidemia  E78.2     4. Palpitations  R00.2         RECOMMENDATIONS: Adrian Willis is a 67 y.o. male whose past medical history and cardiac risk factors include:  history of stroke, hypertension, hyperlipidemia, and and heart failure with reduced EF, stage B, NYHA class II, advanced age.    HFrEF (heart failure with reduced ejection fraction) (HCC) Cardiomyopathy Stage B, NYHA class II. Compensated. Medications reconciled. Blood pressure and weights have remained stable. No use of sublingual nitroglycerin tablets since last office visit. Cardiac MRI is still pending.  Will need to have a CBC prior to his cardiac MRI.  Will await his appointment prior to placing labs Refill Toprol-XL  Essential hypertension Office blood pressures are well-controlled. No changes warranted at this time  Mixed hyperlipidemia Currently on simvastatin.   He denies myalgia or other side effects. Most recent lipids dated December 2023, independently reviewed as noted above. Currently managed by primary care provider.  Palpitations Stable Refill Toprol-XL 12.5 mg p.o. daily with holding parameters  FINAL MEDICATION LIST END OF ENCOUNTER: Meds ordered this encounter  Medications   metoprolol succinate (TOPROL XL) 25 MG 24 hr tablet    Sig: Take 0.5 tablets (12.5 mg total) by mouth every morning. Hold if systolic blood pressure (top number) less than 100 mmHg or pulse less than 60 bpm.    Dispense:  45 tablet    Refill:  0     There are no discontinued medications.     Current Outpatient Medications:     metoprolol succinate (TOPROL XL) 25 MG 24 hr  tablet, Take 0.5 tablets (12.5 mg total) by mouth every morning. Hold if systolic blood pressure (top number) less than 100 mmHg or pulse less than 60 bpm., Disp: 45 tablet, Rfl: 0   albuterol (VENTOLIN HFA) 108 (90 Base) MCG/ACT inhaler, TAKE 2 PUFFS BY MOUTH EVERY 6 HOURS AS NEEDED FOR WHEEZE OR SHORTNESS OF BREATH, Disp: 18 each, Rfl: 0   ascorbic acid (VITAMIN C) 1000 MG tablet, Take 1,000 mg by mouth daily., Disp: , Rfl:    aspirin EC (ASPIRIN LOW DOSE) 81 MG tablet, Take 1 tablet (81 mg total) by mouth 5 (five) times daily. SWALLOW WHOLE., Disp: 90 tablet, Rfl: 3   betamethasone dipropionate 0.05 % cream, Apply topically daily as needed., Disp: 45 g, Rfl: 0   dapagliflozin propanediol (FARXIGA) 10 MG TABS tablet, Take 1 tablet (10 mg total) by mouth daily before breakfast., Disp: 90 tablet, Rfl: 3   dextromethorphan-guaiFENesin (MUCINEX DM) 30-600 MG 12hr tablet, Take 1 tablet by mouth 2 (two) times daily., Disp: 10 tablet, Rfl: 0   nitroGLYCERIN (NITROSTAT) 0.4 MG SL tablet, Place 1 tablet (0.4 mg total) under the tongue every 5 (five) minutes as needed for chest pain. If you require more than two tablets five minutes apart go to the nearest ER via EMS., Disp: 30 tablet, Rfl: 0   sacubitril-valsartan (ENTRESTO) 97-103 MG, Take 1 tablet by mouth 2 (two) times daily., Disp: 180 tablet, Rfl: 3   simvastatin (ZOCOR) 80 MG tablet, Take 1 tablet (80 mg total) by mouth daily., Disp: 90 tablet, Rfl: 3   tiZANidine (ZANAFLEX) 4 MG tablet, Take 1 tablet (4 mg total) by mouth 2 (two) times daily as needed for muscle spasms. (Patient not taking: Reported on 02/19/2022), Disp: 20 tablet, Rfl: 0   tiZANidine (ZANAFLEX) 4 MG tablet, Take 1 tablet (4 mg total) by mouth at bedtime as needed for muscle spasms., Disp: 15 tablet, Rfl: 0  Orders Placed This Encounter  Procedures   EKG 12-Lead    --Continue cardiac medications as reconciled in final medication  list. --Return in about 6 months (around 11/13/2022) for Follow up cardiac MRI and cardiomyopathy. . Or sooner if needed. --Continue follow-up with your primary care physician regarding the management of your other chronic comorbid conditions.  Patient's questions and concerns were addressed to his satisfaction. He voices understanding of the instructions provided during this encounter.   This note was created using a voice recognition software as a result there may be grammatical errors inadvertently enclosed that do not reflect the nature of this encounter. Every attempt is made to correct such errors.  Tessa Lerner, [place], [facility]  Pager:  [phone] Office: [phone]

## 2022-06-01 ENCOUNTER — Ambulatory Visit (HOSPITAL_COMMUNITY): Payer: No Typology Code available for payment source

## 2022-06-15 ENCOUNTER — Telehealth (HOSPITAL_COMMUNITY): Payer: Self-pay | Admitting: *Deleted

## 2022-06-15 NOTE — Telephone Encounter (Signed)
Reaching out to patient to offer assistance regarding upcoming cardiac imaging study; pt verbalizes understanding of appt date/time, parking situation and where to check in, and verified current allergies; name and call back number provided for further questions should they arise ? ?Ewel Lona RN Navigator Cardiac Imaging ?[facility] ?[phone] office ?[phone] cell ? ?Patient denies metal or claustrophobia. ?

## 2022-06-16 ENCOUNTER — Other Ambulatory Visit: Payer: Self-pay | Admitting: Cardiology

## 2022-06-16 ENCOUNTER — Ambulatory Visit (HOSPITAL_COMMUNITY)
Admission: RE | Admit: 2022-06-16 | Discharge: 2022-06-16 | Disposition: A | Discharge status: Home / Self Care | Payer: No Typology Code available for payment source | Source: Ambulatory Visit | Attending: Cardiology | Admitting: Cardiology

## 2022-06-16 ENCOUNTER — Ambulatory Visit: Payer: No Typology Code available for payment source | Admitting: Medical

## 2022-06-16 VITALS — BP 110/80 | HR 64 | Temp 97.3°F | Wt 165.6 lb

## 2022-06-16 DIAGNOSIS — R443 Hallucinations, unspecified: Secondary | ICD-10-CM | POA: Diagnosis not present

## 2022-06-16 DIAGNOSIS — I5020 Unspecified systolic (congestive) heart failure: Secondary | ICD-10-CM

## 2022-06-16 DIAGNOSIS — G479 Sleep disorder, unspecified: Secondary | ICD-10-CM | POA: Diagnosis not present

## 2022-06-16 DIAGNOSIS — R251 Tremor, unspecified: Secondary | ICD-10-CM

## 2022-06-16 DIAGNOSIS — Z8673 Personal history of transient ischemic attack (TIA), and cerebral infarction without residual deficits: Secondary | ICD-10-CM | POA: Diagnosis not present

## 2022-06-16 DIAGNOSIS — I429 Cardiomyopathy, unspecified: Secondary | ICD-10-CM | POA: Diagnosis not present

## 2022-06-16 DIAGNOSIS — I10 Essential (primary) hypertension: Secondary | ICD-10-CM

## 2022-06-16 LAB — POCT URINALYSIS DIP (PROADVANTAGE DEVICE)
Bilirubin, UA: NEGATIVE
Blood, UA: NEGATIVE
Glucose, UA: >=1000 mg/dL — AB
Ketones, POC UA: NEGATIVE mg/dL
Leukocytes, UA: NEGATIVE
Nitrite, UA: NEGATIVE
Protein Ur, POC: NEGATIVE mg/dL
Specific Gravity, Urine: 1.02
Urobilinogen, Ur: NEGATIVE
pH, UA: 6 (ref 5.0–8.0)

## 2022-06-16 LAB — BASIC METABOLIC PANEL

## 2022-06-16 LAB — CBC
MCHC: 31.5 g/dL (ref 31.5–35.7)
Platelets: 200 10*3/uL (ref 150–450)

## 2022-06-16 MED ORDER — GADOBUTROL 1 MMOL/ML IV SOLN
10.0000 mL | Freq: Once | INTRAVENOUS | Status: AC | PRN
  Administered 2022-06-16: 10 mL via INTRAVENOUS

## 2022-06-16 NOTE — Progress Notes (Signed)
Subjective:  Adrian Willis is a 67 y.o. male who presents for Chief Complaint  Patient presents with   Medical Management of Chronic Issues    Hands shaking- been going on for a while Having hallucinations as he feels people are touching him but no one is there or feels that someone is watching him. This has been going on for 3 weeks     Here for some concerns for past few weeks.  Lately been having some shaking in his hands. Happening 3 months.  Difficulty writing things at times, gets stuck on a letter or worse, also been having this issue for about 3 months.    Lately for past few weeks sometimes when he awakes, feels like he sees somebody in the room.  Several times he has awoken, saw someone and jumped up to defer himself, but realized no one was there.   Lately was out of town on business, in hotel in [place], and slept with the light own due to these hallucinations.  Not sleeping well in general when traveling for work.  Has hx/o prostate issues, nocturia, so does sometimes get up to urinate, sees urology for this.   Sometimes when he has jumped out of the bed thinking he has seen someone in his hotel room, he will feel tingling on his face.  No other new weakness or tingling or numbness  No chest pain or SOB.  No urinary or bowel issues.   Wife encouraged him to come in for eval.  He feels like this is related to prior stroke and travel stressors..  When traveling for work, will be gone for 10 days at a time twice monthly.  So basically is out of town 20 days per month, home 10 days per month  At home yesterday slept great, no problems  Denies drug or alcohol use.  Nonsmoker.   No other aggravating or relieving factors.    No other c/o.  Review of Systems  Constitutional:  Negative for chills, fever, malaise/fatigue and weight loss.  HENT:  Negative for congestion, ear pain, hearing loss, sore throat and tinnitus.   Eyes:  Negative for blurred vision, pain and  redness.  Respiratory:  Negative for cough, hemoptysis and shortness of breath.   Cardiovascular:  Negative for chest pain, palpitations, orthopnea, claudication and leg swelling.  Gastrointestinal:  Negative for abdominal pain, blood in stool, constipation, diarrhea, nausea and vomiting.  Genitourinary:  Positive for frequency. Negative for dysuria, flank pain, hematuria and urgency.  Musculoskeletal:  Positive for joint pain. Negative for falls and myalgias.  Skin:  Negative for itching and rash.  Neurological:  Negative for dizziness, tingling, speech change, weakness and headaches.  Endo/Heme/Allergies:  Negative for polydipsia. Does not bruise/bleed easily.  Psychiatric/Behavioral:  Negative for depression and memory loss. The patient has insomnia. The patient is not nervous/anxious.     Past Medical History:  Diagnosis Date   BPH (benign prostatic hyperplasia)    CHF (congestive heart failure) (HCC)    CVA (cerebral vascular accident) (HCC) 2011   numbness right index finger and right thumb and right thigh   GERD (gastroesophageal reflux disease)    Hyperlipidemia    Hypertension    Current Outpatient Medications on File Prior to Visit  Medication Sig Dispense Refill   ascorbic acid (VITAMIN C) 1000 MG tablet Take 1,000 mg by mouth daily.     aspirin EC (ASPIRIN LOW DOSE) 81 MG tablet Take 1 tablet (81 mg total) by mouth  5 (five) times daily. SWALLOW WHOLE. 90 tablet 3   betamethasone dipropionate 0.05 % cream Apply topically daily as needed. 45 g 0   dapagliflozin propanediol (FARXIGA) 10 MG TABS tablet Take 1 tablet (10 mg total) by mouth daily before breakfast. 90 tablet 3   metoprolol succinate (TOPROL XL) 25 MG 24 hr tablet Take 0.5 tablets (12.5 mg total) by mouth every morning. Hold if systolic blood pressure (top number) less than 100 mmHg or pulse less than 60 bpm. 45 tablet 0   sacubitril-valsartan (ENTRESTO) 97-103 MG Take 1 tablet by mouth 2 (two) times daily. 180  tablet 3   simvastatin (ZOCOR) 80 MG tablet Take 1 tablet (80 mg total) by mouth daily. 90 tablet 3   nitroGLYCERIN (NITROSTAT) 0.4 MG SL tablet Place 1 tablet (0.4 mg total) under the tongue every 5 (five) minutes as needed for chest pain. If you require more than two tablets five minutes apart go to the nearest ER via EMS. 30 tablet 0   No current facility-administered medications on file prior to visit.     The following portions of the patient's history were reviewed and updated as appropriate: allergies, current medications, past family history, past medical history, past social history, past surgical history and problem list.  ROS Otherwise as in subjective above  Objective: BP 110/80   Pulse 64   Temp (!) 97.3 F (36.3 C)   Wt 165 lb 9.6 oz (75.1 kg)   BMI 25.18 kg/m   Wt Readings from Last 3 Encounters:  06/16/22 165 lb 9.6 oz (75.1 kg)  05/14/22 161 lb 12.8 oz (73.4 kg)  02/19/22 158 lb 12.8 oz (72 kg)    General appearance: alert, no distress, well developed, well nourished HEENT: normocephalic, sclerae anicteric, conjunctiva pink and moist, TMs pearly, nares patent, no discharge or erythema, pharynx normal Oral cavity: MMM, no lesions Neck: supple, no lymphadenopathy, no thyromegaly, no masses Heart: RRR, normal S1, S2, no murmurs Lungs: CTA bilaterally, no wheezes, rhonchi, or rales Pulses: 2+ radial pulses, 2+ pedal pulses, normal cap refill Ext: no edema Neuro: mild weakness of left leg compared to right but otherwise CN2-12 instant, normal strength and sensation, no cerebellar signs, neuro otherwise unremarkable, no obvious shaking or tremor Psych: pleasant, good eye contact, answers questions appropriately   Assessment: Encounter Diagnoses  Name Primary?   Sleep disturbance Yes   Hallucination    Shakiness    History of stroke    Essential hypertension, benign    HFrEF (heart failure with reduced ejection fraction) (HCC)      Plan: I suspect his  delirium symptoms are related to frequent travel, poor sleep, and stress.  Reviewed MMSE, 20/29. Hearing and vision screen reviewed  Labs today  Advised he try some medication when traveling for sleep.  Pending labs, will likely have him do trial of Lunesta.  Discussed risks/benefits and proper use of medication.  Discussed sleep hygiene, other measures to help feel safe and secure in hotels/traveling.  If symptoms continue, may need to see neurology or other, or potential take a little time off to rest.   Isao was seen today for medical management of chronic issues.  Diagnoses and all orders for this visit:  Sleep disturbance -     TSH -     Vitamin B12 -     POCT Urinalysis DIP (Proadvantage Device) -     CBC -     Basic metabolic panel  Hallucination -  TSH -     Vitamin B12 -     POCT Urinalysis DIP (Proadvantage Device) -     CBC -     Basic metabolic panel  Shakiness -     TSH -     Vitamin B12 -     POCT Urinalysis DIP (Proadvantage Device) -     CBC -     Basic metabolic panel  History of stroke  Essential hypertension, benign  HFrEF (heart failure with reduced ejection fraction) (HCC)    Follow up: pending labs

## 2022-06-17 ENCOUNTER — Other Ambulatory Visit: Payer: Self-pay | Admitting: Medical

## 2022-06-17 LAB — BASIC METABOLIC PANEL
BUN/Creatinine Ratio: 10 (ref 10–24)
CO2: 24 mmol/L (ref 20–29)
Creatinine, Ser: 0.96 mg/dL (ref 0.76–1.27)
Glucose: 68 mg/dL — ABNORMAL LOW (ref 70–99)
eGFR: 86 mL/min/{1.73_m2} (ref >59)

## 2022-06-17 LAB — CBC
Hematocrit: 48.5 % (ref 37.5–51.0)
Hemoglobin: 15.3 g/dL (ref 13.0–17.7)
MCH: 27 pg (ref 26.6–33.0)
MCV: 86 fL (ref 79–97)
RBC: 5.66 x10E6/uL (ref 4.14–5.80)
RDW: 13.4 % (ref 11.6–15.4)
WBC: 4.5 10*3/uL (ref 3.4–10.8)

## 2022-06-17 LAB — VITAMIN B12: Vitamin B-12: 493 pg/mL (ref 232–1245)

## 2022-06-17 LAB — TSH: TSH: 0.79 u[IU]/mL (ref 0.450–4.500)

## 2022-06-17 MED ORDER — ESZOPICLONE 2 MG PO TABS: 2.0000 mg | ORAL_TABLET | Freq: Every day | ORAL | 0 refills | Status: DC

## 2022-06-17 NOTE — Progress Notes (Signed)
Labs show low glucose.   Otherwise electrolytes, thyroid, b12, blood counts normal  Given the glucose findings, I want to add the following labs (either see if Adrian Willis can add or come back for morning fasting labs) - Morning cortisol, insulin, and prolactin level  Begin trial of Lunesta sleep aid.   Consider taking a little time off or working here locally if possible for a few weeks

## 2022-06-18 ENCOUNTER — Other Ambulatory Visit: Payer: Self-pay | Admitting: Medical

## 2022-06-18 ENCOUNTER — Encounter: Payer: Self-pay | Admitting: Internal Medicine

## 2022-06-18 DIAGNOSIS — R270 Ataxia, unspecified: Secondary | ICD-10-CM

## 2022-06-18 DIAGNOSIS — R7309 Other abnormal glucose: Secondary | ICD-10-CM

## 2022-06-18 DIAGNOSIS — R410 Disorientation, unspecified: Secondary | ICD-10-CM

## 2022-06-18 DIAGNOSIS — G479 Sleep disorder, unspecified: Secondary | ICD-10-CM

## 2022-06-18 DIAGNOSIS — R443 Hallucinations, unspecified: Secondary | ICD-10-CM

## 2022-06-18 LAB — INSULIN, RANDOM

## 2022-06-18 LAB — PROLACTIN

## 2022-06-20 ENCOUNTER — Other Ambulatory Visit: Payer: Self-pay | Admitting: Cardiology

## 2022-06-20 DIAGNOSIS — I059 Rheumatic mitral valve disease, unspecified: Secondary | ICD-10-CM

## 2022-06-20 DIAGNOSIS — I5020 Unspecified systolic (congestive) heart failure: Secondary | ICD-10-CM

## 2022-06-20 DIAGNOSIS — I429 Cardiomyopathy, unspecified: Secondary | ICD-10-CM

## 2022-06-20 NOTE — Progress Notes (Signed)
cMRI raises the clinical suspicion for mitral regurgitation.  Will order an echocardiogram for further evaluation.  Shamond Skelton [place], DO, [facility]

## 2022-06-21 ENCOUNTER — Other Ambulatory Visit: Payer: No Typology Code available for payment source

## 2022-06-21 DIAGNOSIS — R443 Hallucinations, unspecified: Secondary | ICD-10-CM

## 2022-06-21 DIAGNOSIS — R410 Disorientation, unspecified: Secondary | ICD-10-CM

## 2022-06-21 DIAGNOSIS — G479 Sleep disorder, unspecified: Secondary | ICD-10-CM

## 2022-06-21 DIAGNOSIS — R7309 Other abnormal glucose: Secondary | ICD-10-CM

## 2022-06-21 DIAGNOSIS — R270 Ataxia, unspecified: Secondary | ICD-10-CM

## 2022-06-22 LAB — CORTISOL: Cortisol: 5.5 ug/dL — ABNORMAL LOW (ref 6.2–19.4)

## 2022-06-22 NOTE — Progress Notes (Signed)
Called patient no answer left a vm

## 2022-06-25 ENCOUNTER — Ambulatory Visit: Payer: No Typology Code available for payment source

## 2022-06-25 DIAGNOSIS — I059 Rheumatic mitral valve disease, unspecified: Secondary | ICD-10-CM

## 2022-06-25 DIAGNOSIS — I5020 Unspecified systolic (congestive) heart failure: Secondary | ICD-10-CM

## 2022-06-25 DIAGNOSIS — I429 Cardiomyopathy, unspecified: Secondary | ICD-10-CM

## 2022-06-29 NOTE — Progress Notes (Signed)
Called and spoke with patient regarding his echocardiogram results.  ?

## 2022-06-30 LAB — INSULIN, FREE AND TOTAL
Free Insulin: 7.1 uU/mL
Total Insulin: 7.1 uU/mL

## 2022-06-30 LAB — CORTISOL: Cortisol: 6.5 ug/dL (ref 6.2–19.4)

## 2022-06-30 LAB — PROLACTIN: Prolactin: 9.4 ng/mL (ref 3.6–25.2)

## 2022-07-01 ENCOUNTER — Other Ambulatory Visit: Payer: Self-pay

## 2022-07-01 DIAGNOSIS — R270 Ataxia, unspecified: Secondary | ICD-10-CM

## 2022-07-01 DIAGNOSIS — R443 Hallucinations, unspecified: Secondary | ICD-10-CM

## 2022-07-01 DIAGNOSIS — R410 Disorientation, unspecified: Secondary | ICD-10-CM

## 2022-07-01 NOTE — Progress Notes (Signed)
The additional labs were normal.  At this point I recommend a follow-up here soon, and if still having some of the same symptoms lets go ahead and refer to neurology

## 2022-07-09 LAB — SPECIMEN STATUS REPORT

## 2022-07-11 NOTE — Progress Notes (Signed)
Lets get him back on the schedule to recheck from his recent visit and discuss results

## 2022-08-06 ENCOUNTER — Ambulatory Visit (INDEPENDENT_AMBULATORY_CARE_PROVIDER_SITE_OTHER): Payer: No Typology Code available for payment source | Admitting: Medical

## 2022-08-06 VITALS — BP 120/70 | HR 67 | Wt 167.6 lb

## 2022-08-06 DIAGNOSIS — I10 Essential (primary) hypertension: Secondary | ICD-10-CM

## 2022-08-06 DIAGNOSIS — R270 Ataxia, unspecified: Secondary | ICD-10-CM

## 2022-08-06 DIAGNOSIS — R7309 Other abnormal glucose: Secondary | ICD-10-CM | POA: Diagnosis not present

## 2022-08-06 DIAGNOSIS — R251 Tremor, unspecified: Secondary | ICD-10-CM

## 2022-08-06 DIAGNOSIS — Z8673 Personal history of transient ischemic attack (TIA), and cerebral infarction without residual deficits: Secondary | ICD-10-CM

## 2022-08-06 DIAGNOSIS — G479 Sleep disorder, unspecified: Secondary | ICD-10-CM

## 2022-08-06 DIAGNOSIS — E559 Vitamin D deficiency, unspecified: Secondary | ICD-10-CM

## 2022-08-06 NOTE — Patient Instructions (Addendum)
Recommendations Don't skip meals Take a variety of foods with you when traveling so you have options Eat 2-3 fruits daily, vegetables every meal Eat nuts, fish, use olive oil, and consider supplement with daily multivitamin and fish oil Consider occupational or physical therapy referral Consider brain MRI imaging Follow up with neurology as scheduled

## 2022-08-06 NOTE — Progress Notes (Signed)
Subjective:  Adrian Willis is a 67 y.o. male who presents for Chief Complaint  Patient presents with   Consult    Discuss lab results     Here for follow up.  I saw him in late May for concerns.  At that time he noted hand shaking, difficulty writing things at times, gets stuck on a letter or worse, was having some hallucinations at night or waking up feeling like someone was in his hotel room.  He travels a lot for work and doesn't sleep well unless at home.     Since last visit, he feels things have improved. Still getting shakes.  Sleep has improved significantly. Still traveling a lot but sleeping better.  He notes when eating or doing something, feels some shakes in hands.  Also notes it writing. He avoids writing in front of people given the shakes.  When sleeping well, handwriting improves as well.   Is non fasting today, last ate 2 hours ago.  No chest pain or SOB.  No urinary or bowel issues.  Denies drug or alcohol use.  Nonsmoker.   Lately has had sensation of glands swollen in face before ears.  He had tonsillitis and similar glands swollen as a child  Diet has been varied when travel gin as some small towns have little options for good food,healthy food.  No other aggravating or relieving factors.    No other c/o.  Past Medical History:  Diagnosis Date   BPH (benign prostatic hyperplasia)    CHF (congestive heart failure) (HCC)    CVA (cerebral vascular accident) (HCC) 2011   numbness right index finger and right thumb and right thigh   GERD (gastroesophageal reflux disease)    Hyperlipidemia    Hypertension    Current Outpatient Medications on File Prior to Visit  Medication Sig Dispense Refill   ascorbic acid (VITAMIN C) 1000 MG tablet Take 1,000 mg by mouth daily.     aspirin EC (ASPIRIN LOW DOSE) 81 MG tablet Take 1 tablet (81 mg total) by mouth 5 (five) times daily. SWALLOW WHOLE. 90 tablet 3   betamethasone dipropionate 0.05 % cream Apply topically  daily as needed. 45 g 0   dapagliflozin propanediol (FARXIGA) 10 MG TABS tablet Take 1 tablet (10 mg total) by mouth daily before breakfast. 90 tablet 3   eszopiclone (LUNESTA) 2 MG TABS tablet Take 1 tablet (2 mg total) by mouth at bedtime. Take immediately before bedtime 20 tablet 0   metoprolol succinate (TOPROL XL) 25 MG 24 hr tablet Take 0.5 tablets (12.5 mg total) by mouth every morning. Hold if systolic blood pressure (top number) less than 100 mmHg or pulse less than 60 bpm. 45 tablet 0   sacubitril-valsartan (ENTRESTO) 97-103 MG Take 1 tablet by mouth 2 (two) times daily. 180 tablet 3   simvastatin (ZOCOR) 80 MG tablet Take 1 tablet (80 mg total) by mouth daily. 90 tablet 3   nitroGLYCERIN (NITROSTAT) 0.4 MG SL tablet Place 1 tablet (0.4 mg total) under the tongue every 5 (five) minutes as needed for chest pain. If you require more than two tablets five minutes apart go to the nearest ER via EMS. 30 tablet 0   No current facility-administered medications on file prior to visit.     Review of Systems  Constitutional:  Negative for chills, fever, malaise/fatigue and weight loss.  HENT:  Negative for congestion, ear pain, hearing loss, sore throat and tinnitus.   Eyes:  Negative for blurred  vision, pain and redness.  Respiratory:  Negative for cough, hemoptysis and shortness of breath.   Cardiovascular:  Negative for chest pain, palpitations, orthopnea, claudication and leg swelling.  Gastrointestinal:  Negative for abdominal pain, blood in stool, constipation, diarrhea, nausea and vomiting.  Genitourinary:  Negative for dysuria, flank pain, frequency, hematuria and urgency.  Musculoskeletal:  Negative for falls, joint pain and myalgias.  Skin:  Negative for itching and rash.  Neurological:  Positive for tremors. Negative for dizziness, tingling, speech change, weakness and headaches.  Endo/Heme/Allergies:  Negative for polydipsia. Does not bruise/bleed easily.  Psychiatric/Behavioral:   Negative for depression and memory loss. The patient has insomnia. The patient is not nervous/anxious.     Past Medical History:  Diagnosis Date   BPH (benign prostatic hyperplasia)    CHF (congestive heart failure) (HCC)    CVA (cerebral vascular accident) (HCC) 2011   numbness right index finger and right thumb and right thigh   GERD (gastroesophageal reflux disease)    Hyperlipidemia    Hypertension      Current Outpatient Medications on File Prior to Visit  Medication Sig Dispense Refill   ascorbic acid (VITAMIN C) 1000 MG tablet Take 1,000 mg by mouth daily.     aspirin EC (ASPIRIN LOW DOSE) 81 MG tablet Take 1 tablet (81 mg total) by mouth 5 (five) times daily. SWALLOW WHOLE. 90 tablet 3   betamethasone dipropionate 0.05 % cream Apply topically daily as needed. 45 g 0   dapagliflozin propanediol (FARXIGA) 10 MG TABS tablet Take 1 tablet (10 mg total) by mouth daily before breakfast. 90 tablet 3   eszopiclone (LUNESTA) 2 MG TABS tablet Take 1 tablet (2 mg total) by mouth at bedtime. Take immediately before bedtime 20 tablet 0   metoprolol succinate (TOPROL XL) 25 MG 24 hr tablet Take 0.5 tablets (12.5 mg total) by mouth every morning. Hold if systolic blood pressure (top number) less than 100 mmHg or pulse less than 60 bpm. 45 tablet 0   sacubitril-valsartan (ENTRESTO) 97-103 MG Take 1 tablet by mouth 2 (two) times daily. 180 tablet 3   simvastatin (ZOCOR) 80 MG tablet Take 1 tablet (80 mg total) by mouth daily. 90 tablet 3   nitroGLYCERIN (NITROSTAT) 0.4 MG SL tablet Place 1 tablet (0.4 mg total) under the tongue every 5 (five) minutes as needed for chest pain. If you require more than two tablets five minutes apart go to the nearest ER via EMS. 30 tablet 0   No current facility-administered medications on file prior to visit.     The following portions of the patient's history were reviewed and updated as appropriate: allergies, current medications, past family history, past  medical history, past social history, past surgical history and problem list.  ROS Otherwise as in subjective above    Objective: BP 120/70   Pulse 67   Wt 167 lb 9.6 oz (76 kg)   BMI 25.48 kg/m   Wt Readings from Last 3 Encounters:  08/06/22 167 lb 9.6 oz (76 kg)  06/16/22 165 lb 9.6 oz (75.1 kg)  05/14/22 161 lb 12.8 oz (73.4 kg)    General appearance: alert, no distress, well developed, well nourished HEENT: normocephalic, sclerae anicteric, conjunctiva pink and moist, TMs pearly, nares patent, no discharge or erythema, pharynx normal, no parotid swelling Oral cavity: MMM, no lesions Neck: supple, no lymphadenopathy, no thyromegaly, no masses, no bruits Heart: RRR, normal S1, S2, no murmurs Lungs: CTA bilaterally, no wheezes, rhonchi, or rales Pulses:  2+ radial pulses, 2+ pedal pulses, normal cap refill Ext: no edema Neuro: mild physiological tremor noted, no unusual gait, normal finger to nose and neg rhomberg, otherwise CN2-12 intact, normal UE and LE strength and sensation Psych: pleasant, good eye contact, answers questions appropriately   Assessment: Encounter Diagnoses  Name Primary?   Tremor Yes   Low glucose level    Sleep disturbance    Vitamin D deficiency, unspecified    Ataxia    History of stroke    Essential hypertension, benign       Plan: Discussed last visit symptoms including hallucinations, shakes, fatigue, difficulty coming up with words Reviewed  last labs a few month ago and current symptoms.  Although sleep and symptoms improved from last visit, continue plan to see neurology. Advised MRI brain and therapy referral for OT/PT but he declines.  Advised that he is on beta blocker which is low dose but not helping with tremor.  Doubt we could increase dose of Toprol given pulse rate, but consider primidone for tremor or sleep aid as tremor worse with poor sleep.  He declines sleep aid or other prescription today as sleep is improved, no  additional hallucinations.  Discussed important of sleep hygiene and getting 7-8 hours daily Avoid skipping meals given symptoms and low sugar reading recently Counseled on diet, sleep, supplements such as vitamin D and multivitamins  Labs as below today.  Follow up with neurology consult as planned  Dorion was seen today for consult.  Diagnoses and all orders for this visit:  Tremor -     Basic metabolic panel -     VITAMIN D 25 Hydroxy (Vit-D Deficiency, Fractures)  Low glucose level -     Basic metabolic panel -     VITAMIN D 25 Hydroxy (Vit-D Deficiency, Fractures)  Sleep disturbance -     Basic metabolic panel -     VITAMIN D 25 Hydroxy (Vit-D Deficiency, Fractures)  Vitamin D deficiency, unspecified -     VITAMIN D 25 Hydroxy (Vit-D Deficiency, Fractures)  Ataxia  History of stroke  Essential hypertension, benign     Follow up: pending labs

## 2022-08-07 LAB — BASIC METABOLIC PANEL
BUN/Creatinine Ratio: 7 — ABNORMAL LOW (ref 10–24)
BUN: 8 mg/dL (ref 8–27)
CO2: 27 mmol/L (ref 20–29)
Calcium: 9.2 mg/dL (ref 8.6–10.2)
Chloride: 101 mmol/L (ref 96–106)
Creatinine, Ser: 1.2 mg/dL (ref 0.76–1.27)
Glucose: 84 mg/dL (ref 70–99)
Potassium: 4.9 mmol/L (ref 3.5–5.2)
Sodium: 142 mmol/L (ref 134–144)
eGFR: 66 mL/min/{1.73_m2} (ref >59)

## 2022-08-07 LAB — VITAMIN D 25 HYDROXY (VIT D DEFICIENCY, FRACTURES): Vit D, 25-Hydroxy: 19.9 ng/mL — ABNORMAL LOW (ref 30.0–100.0)

## 2022-08-08 ENCOUNTER — Other Ambulatory Visit: Payer: Self-pay | Admitting: Medical

## 2022-08-08 MED ORDER — VITAMIN D (ERGOCALCIFEROL) 1.25 MG (50000 UNIT) PO CAPS: 50000.0000 [IU] | ORAL_CAPSULE | ORAL | 0 refills | Status: DC

## 2022-08-08 NOTE — Progress Notes (Signed)
Results sent through MyChart

## 2022-09-02 ENCOUNTER — Other Ambulatory Visit: Payer: Self-pay

## 2022-09-02 DIAGNOSIS — I5020 Unspecified systolic (congestive) heart failure: Secondary | ICD-10-CM

## 2022-09-02 MED ORDER — ENTRESTO 97-103 MG PO TABS
1.0000 | ORAL_TABLET | Freq: Two times a day (BID) | ORAL | 3 refills | Status: DC
Start: 2022-09-02 — End: 2022-10-01

## 2022-09-14 ENCOUNTER — Other Ambulatory Visit: Payer: Self-pay | Admitting: Cardiology

## 2022-09-14 DIAGNOSIS — I5020 Unspecified systolic (congestive) heart failure: Secondary | ICD-10-CM

## 2022-09-23 ENCOUNTER — Telehealth: Payer: Self-pay

## 2022-09-23 ENCOUNTER — Ambulatory Visit: Payer: Medicare Other | Admitting: Neurology

## 2022-09-23 NOTE — Telephone Encounter (Signed)
Spoke to patient, aware we will need to reschedule due to no power.

## 2022-10-01 ENCOUNTER — Other Ambulatory Visit: Payer: Self-pay | Admitting: Cardiology

## 2022-10-01 ENCOUNTER — Other Ambulatory Visit: Payer: Self-pay

## 2022-10-01 DIAGNOSIS — I5020 Unspecified systolic (congestive) heart failure: Secondary | ICD-10-CM

## 2022-10-01 MED ORDER — ENTRESTO 97-103 MG PO TABS
1.0000 | ORAL_TABLET | Freq: Two times a day (BID) | ORAL | 3 refills | Status: DC
Start: 2022-10-01 — End: 2022-10-08

## 2022-10-01 MED ORDER — ENTRESTO 97-103 MG PO TABS
1.0000 | ORAL_TABLET | Freq: Two times a day (BID) | ORAL | 3 refills | Status: DC
Start: 2022-10-01 — End: 2022-10-01

## 2022-10-08 ENCOUNTER — Other Ambulatory Visit: Payer: Self-pay

## 2022-10-08 DIAGNOSIS — I5020 Unspecified systolic (congestive) heart failure: Secondary | ICD-10-CM

## 2022-10-08 MED ORDER — ENTRESTO 97-103 MG PO TABS
1.0000 | ORAL_TABLET | Freq: Two times a day (BID) | ORAL | 3 refills | Status: DC
Start: 2022-10-08 — End: 2023-08-19

## 2022-10-10 IMAGING — MR MR SHOULDER*R* W/O CM
4 of 5 series · 20 of 40 positions shown · non-contrast
Comparison: X-ray 08/18/2020

CLINICAL DATA: Right shoulder pain for 4 months following injury

EXAM:
MRI OF THE RIGHT SHOULDER WITHOUT CONTRAST
TECHNIQUE: Multiplanar, multisequence MR imaging of the shoulder was performed.
No intravenous contrast was administered.

[Series 6: T2 fat-sat · axial · right · 3.0mm · 0.47mm/px · z∈[-25,+65]mm · 7 of 27 slices shown (1 of 3)]
[im 1/27]
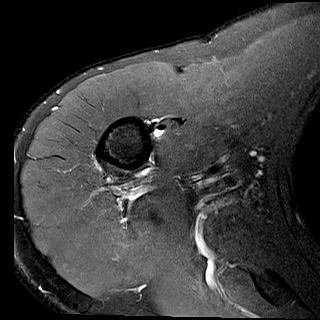
[im 3/27]
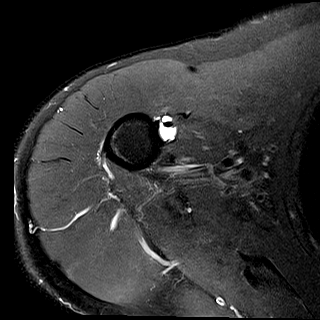
[im 9/27]
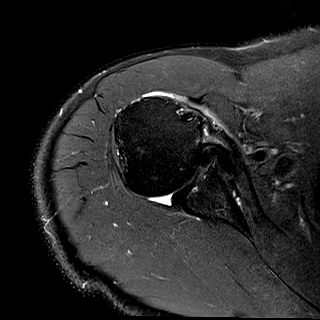
[im 12/27]
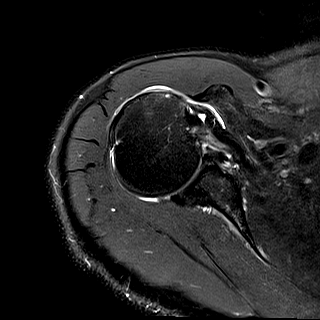
[im 15/27]
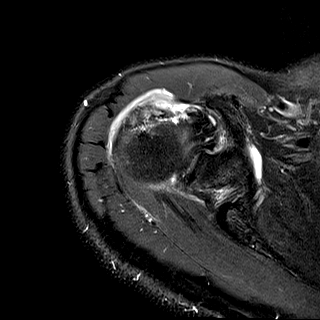
[im 18/27]
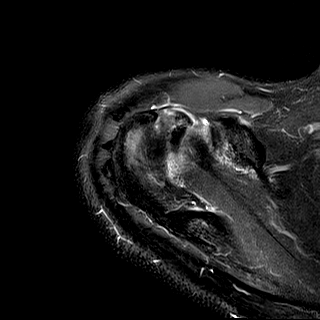
[im 24/27]
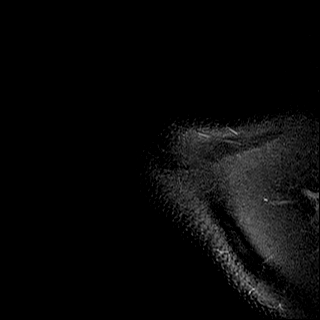

[Series 7: T2 fat-sat · sagittal · right · 4.0mm · 0.22mm/px · 3 of 21 slices shown (2 of 3)]
[im 4/21]
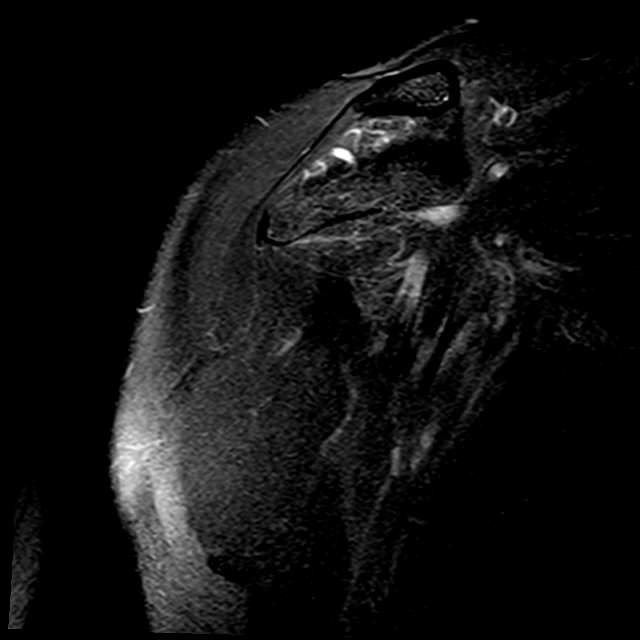
[im 11/21]
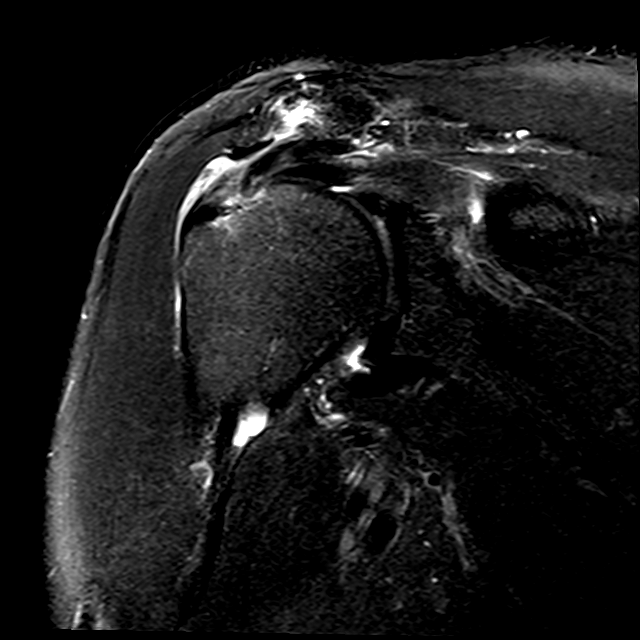
[im 17/21]
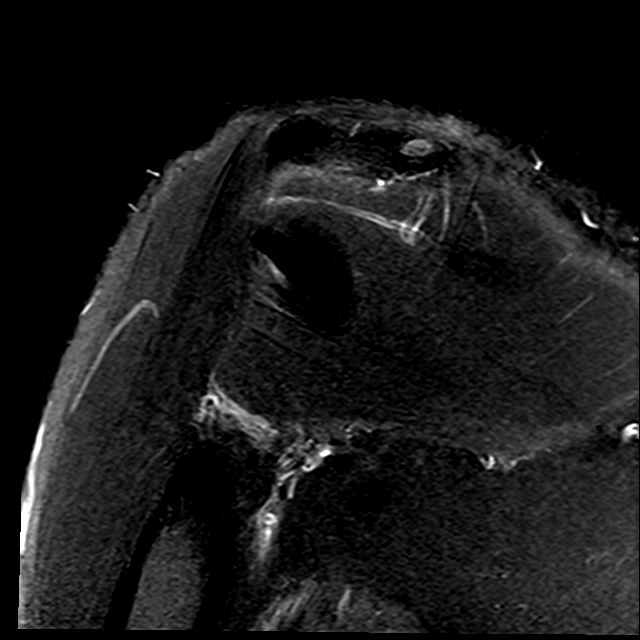

[Series 8: PD · sagittal · right · 4.0mm · 0.22mm/px · 7 of 21 slices shown]
[im 1/21]
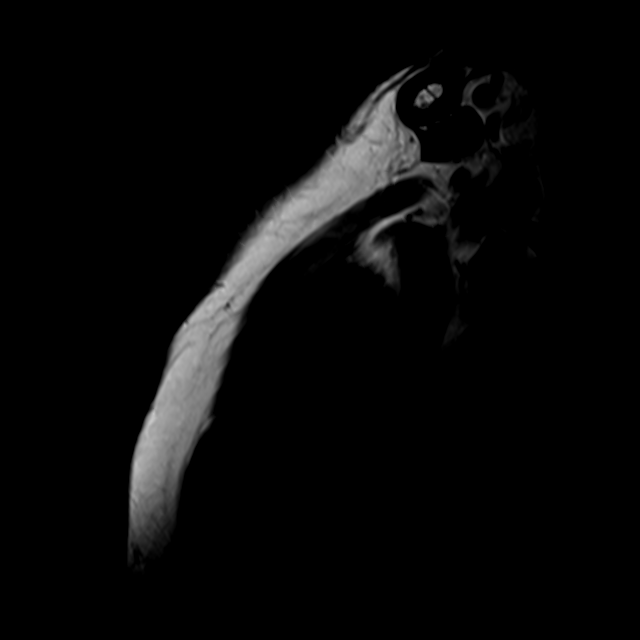
[im 4/21]
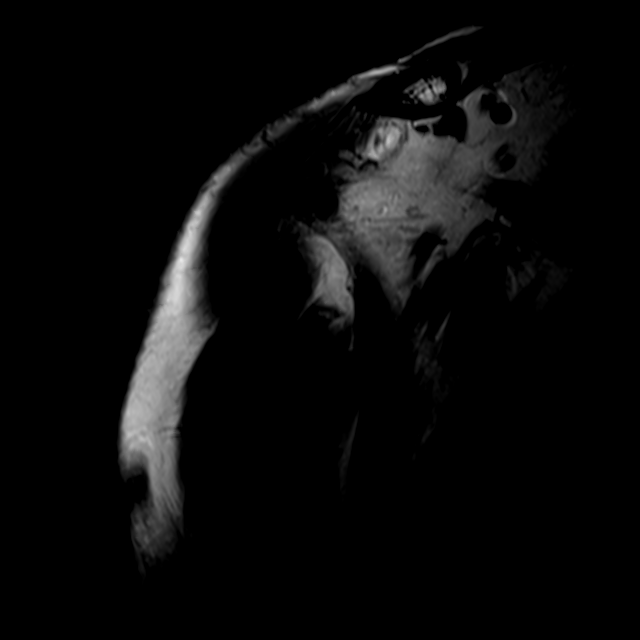
[im 7/21]
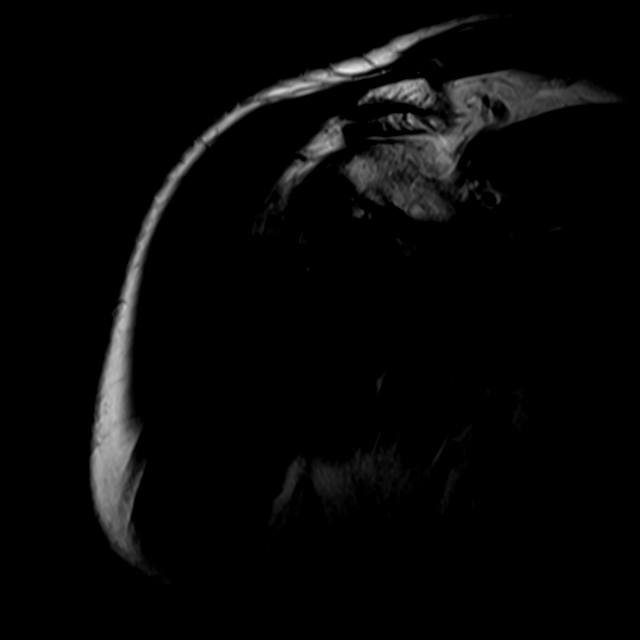
[im 11/21]
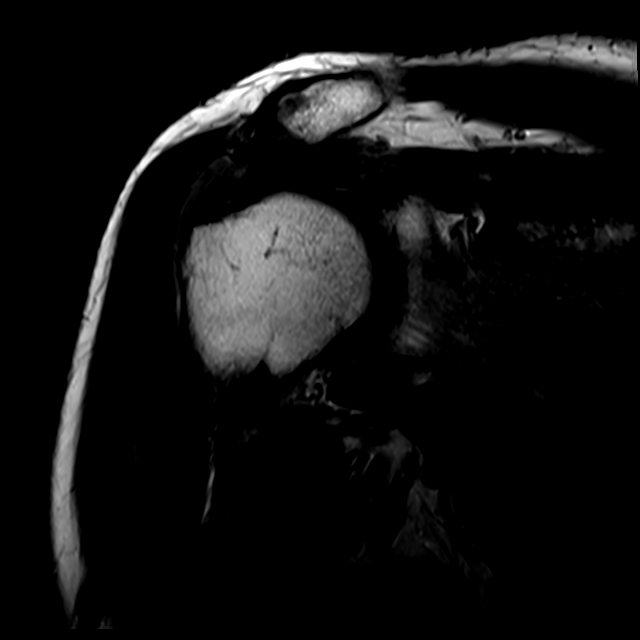
[im 14/21]
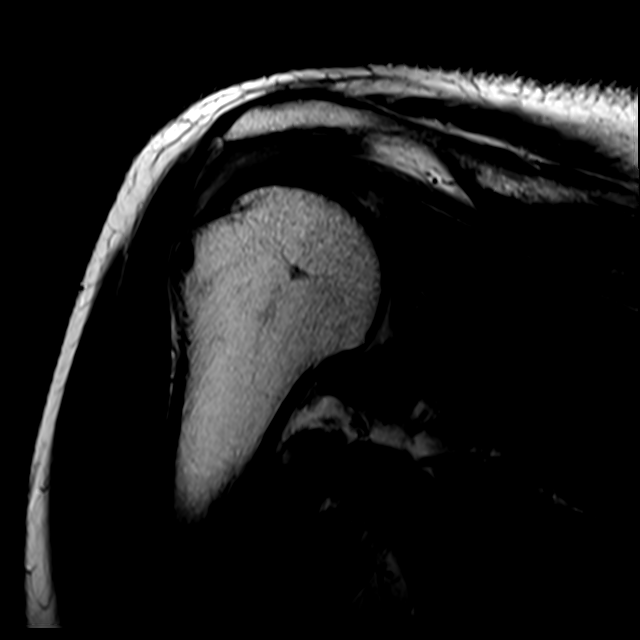
[im 17/21]
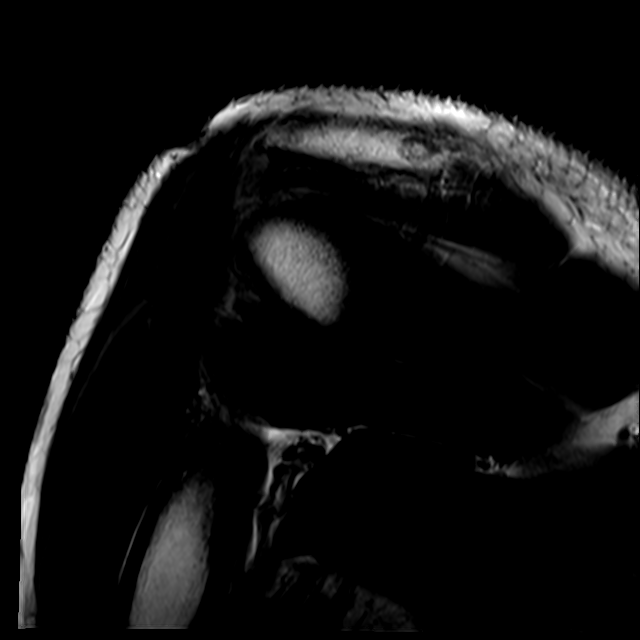
[im 21/21]
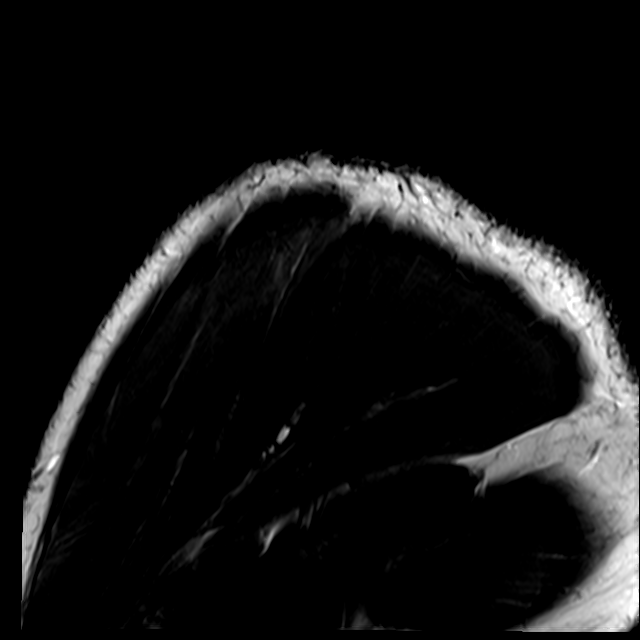

[Series 9: T2 fat-sat · oblique · right · 4.0mm · 0.44mm/px · 3 of 23 slices shown (3 of 3)]
[im 4/23]
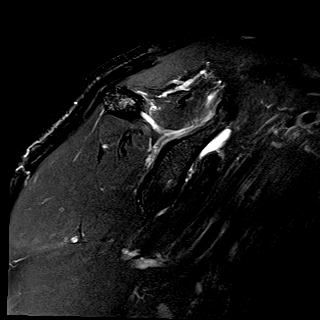
[im 13/23]
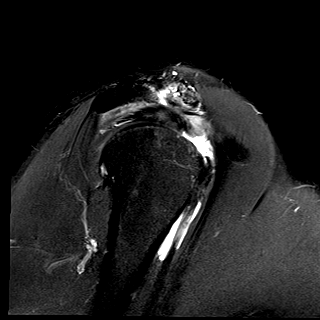
[im 19/23]
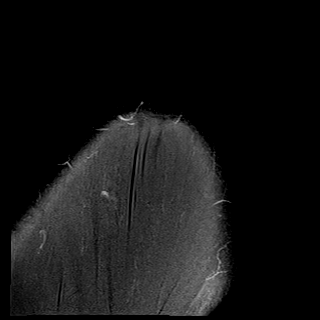

[20 of 40 positions shown; findings below may reference images not displayed]

FINDINGS: Rotator cuff: Severe supraspinatus tendinosis with full-thickness
tear of the anterior tendon insertion measuring 11 mm in AP
dimension and with up to 7 mm of tendon retraction (series 7, images
7-9). Severe subscapularis tendinosis with both bursal and articular
sided partial-thickness tearing, although no full-thickness or
retracted component. Moderate infraspinatus tendinosis without tear.
Intact teres minor.

Muscles: Preserved bulk and signal intensity of the rotator cuff
musculature without edema, atrophy, or fatty infiltration.

Biceps long head: Mild intra-articular biceps tendinosis with mild
tenosynovitis.

Acromioclavicular Joint: Mild arthropathy of the AC joint. Small
volume subacromial-subdeltoid bursal fluid.

Glenohumeral Joint: No cartilage defect.  Trace joint effusion.

Labrum: Labral degeneration most pronounced at the anteroinferior
and posterosuperior aspects with fraying although no evidence of a
detached tear or paralabral cyst.

Bones: No acute fracture or dislocation. No bone marrow edema. No
suspicious bone lesion.

Other: None.
IMPRESSION: 1. Severe supraspinatus tendinosis with full-thickness mildly
retracted tear of the anterior tendon insertion.
2. Severe subscapularis tendinosis with both bursal and articular
sided partial-thickness tearing, although no full-thickness or
retracted component.
3. Moderate infraspinatus tendinosis without tear.
4. Mild intra-articular biceps tendinosis with mild tenosynovitis.
5. Mild AC joint osteoarthritis with mild subacromial-subdeltoid
bursitis.

## 2022-10-26 ENCOUNTER — Ambulatory Visit: Payer: No Typology Code available for payment source | Admitting: Cardiology

## 2022-11-05 ENCOUNTER — Ambulatory Visit: Payer: Self-pay | Admitting: Cardiology

## 2022-12-20 ENCOUNTER — Ambulatory Visit (INDEPENDENT_AMBULATORY_CARE_PROVIDER_SITE_OTHER): Payer: No Typology Code available for payment source | Admitting: Medical

## 2022-12-20 VITALS — BP 110/64 | HR 75 | Temp 97.7°F | Wt 164.4 lb

## 2022-12-20 DIAGNOSIS — L989 Disorder of the skin and subcutaneous tissue, unspecified: Secondary | ICD-10-CM

## 2022-12-20 DIAGNOSIS — Z1211 Encounter for screening for malignant neoplasm of colon: Secondary | ICD-10-CM

## 2022-12-20 DIAGNOSIS — L299 Pruritus, unspecified: Secondary | ICD-10-CM

## 2022-12-20 MED ORDER — PREDNISONE 10 MG PO TABS: 10.0000 mg | ORAL_TABLET | Freq: Every day | ORAL | 0 refills | Status: DC

## 2022-12-20 MED ORDER — HYDROXYZINE HCL 10 MG PO TABS: 10.0000 mg | ORAL_TABLET | Freq: Three times a day (TID) | ORAL | 0 refills | Status: DC | PRN

## 2022-12-20 NOTE — Progress Notes (Signed)
Subjective:  Adrian Willis is a 67 y.o. male who presents for Chief Complaint  Patient presents with   itching all over body    Itching all over body. Has welps all over body. Happens more at night. Got a bug bite in hotel and could not find any bed bugs but found 2 stink bugs and thought that what was biting him. Once he came home he is still itching all over    Here for c/o itching all over body x 1 week.   Mostly in back and arms, but feels it other places.   No nausea, no fever, no chills, no night sweats, no change in appetite.  No blood in stool.    Still travels for work.   These symptoms started while in a hotel in [place].  Thought it was bed bug at first or detergent, however he sleeps in a night shirt and pant, and very little of his skin touched the sheets.    His clothes are always washed at home with hypoallergenic detergent.     No other new symptoms.    He has his BP cuff today from home to compare to our readings.   No other aggravating or relieving factors.    No other c/o.  Past Medical History:  Diagnosis Date   BPH (benign prostatic hyperplasia)    CHF (congestive heart failure) (HCC)    CVA (cerebral vascular accident) (HCC) 2011   numbness right index finger and right thumb and right thigh   GERD (gastroesophageal reflux disease)    Hyperlipidemia    Hypertension    Current Outpatient Medications on File Prior to Visit  Medication Sig Dispense Refill   ascorbic acid (VITAMIN C) 1000 MG tablet Take 1,000 mg by mouth daily.     aspirin EC (ASPIRIN LOW DOSE) 81 MG tablet Take 1 tablet (81 mg total) by mouth 5 (five) times daily. SWALLOW WHOLE. 90 tablet 3   FARXIGA 10 MG TABS tablet TAKE 1 TABLET BY MOUTH DAILY BEFORE BREAKFAST. 90 tablet 3   hydrocortisone cream 1 % Apply 1 Application topically 2 (two) times daily.     sacubitril-valsartan (ENTRESTO) 97-103 MG Take 1 tablet by mouth 2 (two) times daily. 180 tablet 3   simvastatin (ZOCOR) 80 MG tablet  Take 1 tablet (80 mg total) by mouth daily. 90 tablet 3   metoprolol succinate (TOPROL-XL) 25 MG 24 hr tablet TAKE 1 TABLET (25 MG TOTAL) BY MOUTH DAILY. (Patient not taking: Reported on 12/20/2022) 30 tablet 2   nitroGLYCERIN (NITROSTAT) 0.4 MG SL tablet Place 1 tablet (0.4 mg total) under the tongue every 5 (five) minutes as needed for chest pain. If you require more than two tablets five minutes apart go to the nearest ER via EMS. 30 tablet 0   No current facility-administered medications on file prior to visit.     The following portions of the patient's history were reviewed and updated as appropriate: allergies, current medications, past family history, past medical history, past social history, past surgical history and problem list.  ROS Otherwise as in subjective above  Objective: BP 110/64   Pulse 75   Temp 97.7 F (36.5 C)   Wt 164 lb 6.4 oz (74.6 kg)   BMI 25.00 kg/m   Wt Readings from Last 3 Encounters:  12/20/22 164 lb 6.4 oz (74.6 kg)  08/06/22 167 lb 9.6 oz (76 kg)  06/16/22 165 lb 9.6 oz (75.1 kg)   BP Readings from Last  3 Encounters:  12/20/22 110/64  08/06/22 120/70  06/16/22 110/80   131/84 right arm with his cuff today   General appearance: alert, no distress, well developed, well nourished Skin: right palm centrally with 44mm-5mm diameter raised flesh colored thickened lesion suggestive of verrucal lesion ,  left upper chest with a small single 3mm pink/red papule, otherwise no other rash.   Oral cavity: MMM, no lesions Neck: supple, no lymphadenopathy, no thyromegaly, no masses Abdomen: +bs, soft, non tender, non distended, no masses, no hepatomegaly, no splenomegaly Pulses: 2+ radial pulses, 2+ pedal pulses, normal cap refill Ext: no edema   Assessment: Encounter Diagnoses  Name Primary?   Pruritus Yes   Screen for colon cancer    Skin lesion      Plan: Pruritus/itching Begin Hydroxyzine tablet, 1 tablet 2-3 times daily as needed for the  next 5-7 days for itching and sensitivity of the skin Drink plenty of water throughout the day Begin 3 days of prednisone low dose, 10mg  daily for internal inflammation Use a daily moisturizing lotion such as Aquaphor, Cetaphil, Lubriderm or other You can continue the hydrocortisone 2.5% cream from your wife for skin itching or bumps If still itching by the time you return for your fasting physical, we will get additional blood work  Screen for colon cancer  Referral today for updated colon cancer screening  Skin lesion of palm If desired, I can refer you to dermatology skin doctor for complete excision of the palm lesion as well as skin surveillance   Adrian Willis was seen today for itching all over body.  Diagnoses and all orders for this visit:  Pruritus  Screen for colon cancer -     Cologuard  Skin lesion  Other orders -     hydrOXYzine (ATARAX) 10 MG tablet; Take 1 tablet (10 mg total) by mouth 3 (three) times daily as needed. -     predniSONE (DELTASONE) 10 MG tablet; Take 1 tablet (10 mg total) by mouth daily with breakfast.    Follow up: soon for well visit as planned

## 2022-12-20 NOTE — Patient Instructions (Signed)
Pruritus/itching Begin Hydroxyzine tablet, 1 tablet 2-3 times daily as needed for the next 5-7 days for itching and sensitivity of the skin Drink plenty of water throughout the day Begin 3 days of prednisone low dose, 10mg  daily for internal inflammation Use a daily moisturizing lotion such as Aquaphor, Cetaphil, Lubriderm or other You can continue the hydrocortisone 2.5% cream from your wife for skin itching or bumps If still itching by the time you return for your fasting physical, we will get additional blood work  Screen for colon cancer  Referral today for updated colon cancer screening  Skin lesion of palm If desired, I can refer you to dermatology skin doctor for complete excision of the palm lesion as well as skin surveillance

## 2023-01-12 LAB — COLOGUARD: COLOGUARD: NEGATIVE

## 2023-01-13 NOTE — Progress Notes (Signed)
Your Cologuard screening for colon cancer was negative.  This indicates a lower likelihood that colorectal cancer is present.   Lets plan to repeat this in 3 years.  However, if you develop bowel changes, blood in stool, unexpected weight loss, or other new bowel changes, then recheck.

## 2023-01-14 ENCOUNTER — Ambulatory Visit (INDEPENDENT_AMBULATORY_CARE_PROVIDER_SITE_OTHER): Payer: Medicare Other | Admitting: Medical

## 2023-01-14 VITALS — BP 124/80 | HR 77 | Ht 68.5 in | Wt 168.0 lb

## 2023-01-14 DIAGNOSIS — I5020 Unspecified systolic (congestive) heart failure: Secondary | ICD-10-CM | POA: Diagnosis not present

## 2023-01-14 DIAGNOSIS — R7301 Impaired fasting glucose: Secondary | ICD-10-CM

## 2023-01-14 DIAGNOSIS — L509 Urticaria, unspecified: Secondary | ICD-10-CM

## 2023-01-14 DIAGNOSIS — I10 Essential (primary) hypertension: Secondary | ICD-10-CM | POA: Diagnosis not present

## 2023-01-14 DIAGNOSIS — Z23 Encounter for immunization: Secondary | ICD-10-CM | POA: Diagnosis not present

## 2023-01-14 DIAGNOSIS — N529 Male erectile dysfunction, unspecified: Secondary | ICD-10-CM

## 2023-01-14 DIAGNOSIS — R350 Frequency of micturition: Secondary | ICD-10-CM | POA: Diagnosis not present

## 2023-01-14 DIAGNOSIS — Z0000 Encounter for general adult medical examination without abnormal findings: Secondary | ICD-10-CM

## 2023-01-14 DIAGNOSIS — E7800 Pure hypercholesterolemia, unspecified: Secondary | ICD-10-CM

## 2023-01-14 DIAGNOSIS — Z7185 Encounter for immunization safety counseling: Secondary | ICD-10-CM

## 2023-01-14 DIAGNOSIS — N401 Enlarged prostate with lower urinary tract symptoms: Secondary | ICD-10-CM

## 2023-01-14 DIAGNOSIS — Z8673 Personal history of transient ischemic attack (TIA), and cerebral infarction without residual deficits: Secondary | ICD-10-CM

## 2023-01-14 DIAGNOSIS — Z125 Encounter for screening for malignant neoplasm of prostate: Secondary | ICD-10-CM

## 2023-01-14 DIAGNOSIS — E559 Vitamin D deficiency, unspecified: Secondary | ICD-10-CM | POA: Insufficient documentation

## 2023-01-14 DIAGNOSIS — Z1389 Encounter for screening for other disorder: Secondary | ICD-10-CM

## 2023-01-14 LAB — LIPID PANEL

## 2023-01-14 LAB — POCT URINALYSIS DIP (PROADVANTAGE DEVICE)
Bilirubin, UA: NEGATIVE
Blood, UA: NEGATIVE
Glucose, UA: 500 mg/dL — AB
Ketones, POC UA: NEGATIVE mg/dL
Leukocytes, UA: NEGATIVE
Nitrite, UA: NEGATIVE
Protein Ur, POC: NEGATIVE mg/dL
Specific Gravity, Urine: 1.015
Urobilinogen, Ur: NEGATIVE
pH, UA: 6 (ref 5.0–8.0)

## 2023-01-14 NOTE — Progress Notes (Addendum)
Subjective:   HPI  Adrian Willis is a 67 y.o. male who presents for Chief Complaint  Patient presents with   Annual Exam    Fasting cpe, rash under arms and pelvic and right side and back.     Patient Care Team: Daley Mooradian, Cleda Mccreedy as PCP - General (Family Medicine) Sees dentist Dr. Tessa Lerner, cardiology Dr. Norton Blizzard, sports medicine Dr. Jettie Pagan, [facility] Dr. Otho Bellows, eye doctor   Concerns: I saw him a few weeks ago for rash and itching.  He is still having rash and itching.  He took a video of the rash.  The video appears to be urticarial rash on his right lower torso and left axilla.  He says he continues to get this rash intermittently on various parts of his body including the armpits and torso.  Wife has been getting a similar rash at home.  The only other issue they have had at home was ants a few months ago but they have terminated recommend for that.  They had switched to hypoallergenic detergent, does sensitive skin soap.  And yet the rash continues.  He and wife are both using hydroxyzine.  Compliant with medications otherwise.   Reviewed their medical, surgical, family, social, medication, and allergy history and updated chart as appropriate.  Past Medical History:  Diagnosis Date   BPH (benign prostatic hyperplasia)    CHF (congestive heart failure) (HCC)    CVA (cerebral vascular accident) (HCC) 2011   numbness right index finger and right thumb and right thigh   GERD (gastroesophageal reflux disease)    Hyperlipidemia    Hypertension     Past Surgical History:  Procedure Laterality Date   CARPAL TUNNEL RELEASE Right    CYSTOSCOPY WITH INSERTION OF UROLIFT     LASIK     thumb     right, nerve injury repair   TRANSURETHRAL RESECTION OF PROSTATE N/A 12/03/2019   Procedure: TRANSURETHRAL RESECTION OF THE PROSTATE (TURP), BIPOLAR;  Surgeon: Jannifer Hick, MD;  Location: [facility];  Service: Urology;  Laterality: N/A;  ONLY NEEDS  60 MIN    Family History  Problem Relation Age of Onset   Hypertension Mother    Benign prostatic hyperplasia Father    Other Father        died after horrible bed sore and demise   Cancer Sister    Heart disease Neg Hx    Stroke Neg Hx      Current Outpatient Medications:    ascorbic acid (VITAMIN C) 1000 MG tablet, Take 1,000 mg by mouth daily., Disp: , Rfl:    aspirin EC (ASPIRIN LOW DOSE) 81 MG tablet, Take 1 tablet (81 mg total) by mouth 5 (five) times daily. SWALLOW WHOLE., Disp: 90 tablet, Rfl: 3   FARXIGA 10 MG TABS tablet, TAKE 1 TABLET BY MOUTH DAILY BEFORE BREAKFAST., Disp: 90 tablet, Rfl: 3   hydrocortisone cream 1 %, Apply 1 Application topically 2 (two) times daily., Disp: , Rfl:    sacubitril-valsartan (ENTRESTO) 97-103 MG, Take 1 tablet by mouth 2 (two) times daily., Disp: 180 tablet, Rfl: 3   simvastatin (ZOCOR) 80 MG tablet, Take 1 tablet (80 mg total) by mouth daily., Disp: 90 tablet, Rfl: 3   hydrOXYzine (ATARAX) 10 MG tablet, Take 1 tablet (10 mg total) by mouth 3 (three) times daily as needed. (Patient not taking: Reported on 01/14/2023), Disp: 30 tablet, Rfl: 0   metoprolol succinate (TOPROL-XL) 25 MG 24 hr tablet,  TAKE 1 TABLET (25 MG TOTAL) BY MOUTH DAILY. (Patient not taking: Reported on 01/14/2023), Disp: 30 tablet, Rfl: 2   nitroGLYCERIN (NITROSTAT) 0.4 MG SL tablet, Place 1 tablet (0.4 mg total) under the tongue every 5 (five) minutes as needed for chest pain. If you require more than two tablets five minutes apart go to the nearest ER via EMS., Disp: 30 tablet, Rfl: 0  Review of Systems  Constitutional:  Negative for chills, fever, malaise/fatigue and weight loss.  HENT:  Negative for congestion, ear pain, hearing loss, sore throat and tinnitus.   Eyes:  Negative for blurred vision, pain and redness.  Respiratory:  Negative for cough, hemoptysis and shortness of breath.   Cardiovascular:  Negative for chest pain, palpitations, orthopnea, claudication and  leg swelling.  Gastrointestinal:  Negative for abdominal pain, blood in stool, constipation, diarrhea, nausea and vomiting.  Genitourinary:  Negative for dysuria, flank pain, frequency, hematuria and urgency.  Musculoskeletal:  Negative for falls, joint pain and myalgias.  Skin:  Positive for itching and rash.  Neurological:  Negative for dizziness, tingling, speech change, weakness and headaches.  Endo/Heme/Allergies:  Negative for polydipsia. Does not bruise/bleed easily.  Psychiatric/Behavioral:  Negative for depression and memory loss. The patient is not nervous/anxious and does not have insomnia.        01/14/2023   10:22 AM 01/06/2022   10:15 AM 01/05/2021   10:19 AM 09/23/2020   12:29 PM 07/09/2019   12:18 PM  Depression screen PHQ 2/9  Decreased Interest 0 0 0 0 0  Down, Depressed, Hopeless 0 0 0 0 0  PHQ - 2 Score 0 0 0 0 0        Objective:  BP 124/80   Pulse 77   Ht 5' 8.5" (1.74 m)   Wt 168 lb (76.2 kg)   BMI 25.17 kg/m   Wt Readings from Last 3 Encounters:  01/14/23 168 lb (76.2 kg)  12/20/22 164 lb 6.4 oz (74.6 kg)  08/06/22 167 lb 9.6 oz (76 kg)    General appearance: alert, no distress, WD/WN, [place] male Skin: No rash today but on his video, there was clearly urticarial hive rash on his right lower abdomen a few days ago no other worrisome lesions HEENT: normocephalic, conjunctiva/corneas normal, sclerae anicteric, PERRLA, EOMi Neck: supple, no lymphadenopathy, no thyromegaly, no masses, no bruits Chest: non tender, normal shape and expansion Heart: RRR, normal S1, S2, no murmurs Lungs: CTA bilaterally, no wheezes, rhonchi, or rales Abdomen: +bs, soft, non tender, non distended, no masses, no hepatomegaly, no splenomegaly, no bruits Back: non tender, normal ROM, no scoliosis Musculoskeletal: upper extremities non tender, no obvious deformity, normal ROM throughout, lower extremities non tender, no obvious deformity, normal ROM  throughout Extremities: no edema, no cyanosis, no clubbing Pulses: 2+ symmetric, upper and lower extremities, normal cap refill Neurological: alert, oriented x 3, CN2-12 intact, strength normal upper extremities and lower extremities, sensation normal throughout, DTRs 2+ throughout, no cerebellar signs, gait normal Psychiatric: normal affect, behavior normal, pleasant  GU: normal male external genitalia,circumcised, nontender, no masses, no hernia, no lymphadenopathy Rectal: anus normal tone, prostate firm but no specific nodule, mild to moderate enlarged   Assessment and Plan :   Encounter Diagnoses  Name Primary?   Encounter for health maintenance examination in adult Yes   Essential hypertension, benign    HFrEF (heart failure with reduced ejection fraction) (HCC)    Erectile dysfunction, unspecified erectile dysfunction type    Benign prostatic hyperplasia  with urinary frequency    Vaccine counseling    Screening for prostate cancer    Pure hypercholesterolemia    History of stroke    Screening for hematuria or proteinuria    Vitamin D deficiency    Hives    Impaired fasting blood sugar      This visit was a preventative care visit, also known as wellness visit or routine physical.   Topics typically include healthy lifestyle, diet, exercise, preventative care, vaccinations, sick and well care, proper use of emergency dept and after hours care, as well as other concerns.     Recommendations: Continue to return yearly for your annual wellness and preventative care visits.  This gives [place] a chance to discuss healthy lifestyle, exercise, vaccinations, review your chart record, and perform screenings where appropriate.  I recommend you establish with an eye doctor and see them yearly for routine follow up.  I recommend you see your dentist yearly for routine dental care including hygiene visits twice yearly.   Vaccination recommendations were reviewed Immunization History   Administered Date(s) Administered   Influenza Inj Mdck Quad Pf 10/23/2020   Influenza, High Dose Seasonal PF 11/20/2022   Influenza-Unspecified 10/18/2021   Moderna Sars-Covid-2 Vaccination 01/24/2019, 02/21/2019, 11/22/2019   PPD Test 03/24/2021, 03/30/2021   Pfizer(Comirnaty)Fall Seasonal Vaccine 12 years and older 01/14/2023   Pneumococcal Conjugate-13 07/09/2019   Tdap 01/06/2022   Zoster Recombinant(Shingrix) 08/01/2019, 10/03/2019   Consider Prevnar 20.   Screening for cancer: Colon cancer screening: I reviewed Cologuard negative screen from 2024   We discussed PSA, prostate exam, and prostate cancer screening risks/benefits.     Skin cancer screening: Check your skin regularly for new changes, growing lesions, or other lesions of concern Come in for evaluation if you have skin lesions of concern.  Lung cancer screening: If you have a greater than 20 pack year history of tobacco use, then you may qualify for lung cancer screening with a chest CT scan.   Please call your insurance company to inquire about coverage for this test.  We currently don't have screenings for other cancers besides breast, cervical, colon, and lung cancers.  If you have a strong family history of cancer or have other cancer screening concerns, please let me know.    Bone health: Get at least 150 minutes of aerobic exercise weekly Get weight bearing exercise at least once weekly Bone density test:  A bone density test is an imaging test that uses a type of X-ray to measure the amount of calcium and other minerals in your bones. The test may be used to diagnose or screen you for a condition that causes weak or thin bones (osteoporosis), predict your risk for a broken bone (fracture), or determine how well your osteoporosis treatment is working. The bone density test is recommended for females 65 and older, or females or males <65 if certain risk factors such as thyroid disease, long term use of  steroids such as for asthma or rheumatological issues, vitamin D deficiency, estrogen deficiency, family history of osteoporosis, self or family history of fragility fracture in first degree relative.    Heart health: Get at least 150 minutes of aerobic exercise weekly Limit alcohol It is important to maintain a healthy blood pressure and healthy cholesterol numbers  Heart disease screening: Screening for heart disease includes screening for blood pressure, fasting lipids, glucose/diabetes screening, BMI height to weight ratio, reviewed of smoking status, physical activity, and diet.    Goals include blood  pressure 120/80 or less, maintaining a healthy lipid/cholesterol profile, preventing diabetes or keeping diabetes numbers under good control, not smoking or using tobacco products, exercising most days per week or at least 150 minutes per week of exercise, and eating healthy variety of fruits and vegetables, healthy oils, and avoiding unhealthy food choices like fried food, fast food, high sugar and high cholesterol foods.    Follow up with cardiology today as planned   Medical care options: I recommend you continue to seek care here first for routine care.  We try really hard to have available appointments Monday through Friday daytime hours for sick visits, acute visits, and physicals.  Urgent care should be used for after hours and weekends for significant issues that cannot wait till the next day.  The emergency department should be used for significant potentially life-threatening emergencies.  The emergency department is expensive, can often have long wait times for less significant concerns, so try to utilize primary care, urgent care, or telemedicine when possible to avoid unnecessary trips to the emergency department.  Virtual visits and telemedicine have been introduced since the pandemic started in 2020, and can be convenient ways to receive medical care.  We offer virtual appointments  as well to assist you in a variety of options to seek medical care.   Advanced Directives: I recommend you consider completing a Health Care Power of Attorney and Living Will.   These documents respect your wishes and help alleviate burdens on your loved ones if you were to become terminally ill or be in a position to need those documents enforced.    You can complete Advanced Directives yourself, have them notarized, then have copies made for our office, for you and for anybody you feel should have them in safe keeping.  Or, you can have an attorney prepare these documents.   If you haven't updated your Last Will and Testament in a while, it may be worthwhile having an attorney prepare these documents together and save on some costs.       Separate significant issues discussed: HTN , Heart failure - continue current therapy and follow up with cardiology  Hypercholesteremia - continue statin, labs today.  Given that there are more efficacious statins we may end up switching to Crestor or Lipitor instead of simvastatin  Urticaria/hives-labs today, will likely need to refer to allergist for further evaluation  Janlucas was seen today for annual exam.  Diagnoses and all orders for this visit:  Encounter for health maintenance examination in adult -     Comprehensive metabolic panel -     CBC with Differential/Platelet -     Lipid panel -     VITAMIN D 25 Hydroxy (Vit-D Deficiency, Fractures) -     POCT Urinalysis DIP (Proadvantage Device) -     Cortisol -     PSA -     Hemoglobin A1c -     Insulin, random  Essential hypertension, benign -     CBC with Differential/Platelet  HFrEF (heart failure with reduced ejection fraction) (HCC)  Erectile dysfunction, unspecified erectile dysfunction type  Benign prostatic hyperplasia with urinary frequency -     CBC with Differential/Platelet -     PSA  Vaccine counseling -     Pfizer Comirnaty Covid -19 Vaccine 42yrs and  older  Screening for prostate cancer -     PSA  Pure hypercholesterolemia -     Lipid panel  History of stroke  Screening for hematuria or  proteinuria -     POCT Urinalysis DIP (Proadvantage Device)  Vitamin D deficiency -     VITAMIN D 25 Hydroxy (Vit-D Deficiency, Fractures)  Hives  Impaired fasting blood sugar -     Hemoglobin A1c -     Insulin, random    Follow-up pending labs, yearly for physical

## 2023-01-14 NOTE — Addendum Note (Signed)
Addended by: Jac Canavan on: 01/14/2023 01:38 PM   Modules accepted: Orders

## 2023-01-14 NOTE — Addendum Note (Signed)
Addended by: Jac Canavan on: 01/14/2023 12:52 PM   Modules accepted: Orders

## 2023-01-15 ENCOUNTER — Other Ambulatory Visit: Payer: Self-pay | Admitting: Medical

## 2023-01-15 DIAGNOSIS — Z8673 Personal history of transient ischemic attack (TIA), and cerebral infarction without residual deficits: Secondary | ICD-10-CM

## 2023-01-15 LAB — CBC WITH DIFFERENTIAL/PLATELET
Basophils Absolute: 0 10*3/uL (ref 0.0–0.2)
Basos: 0 %
EOS (ABSOLUTE): 0.1 10*3/uL (ref 0.0–0.4)
Eos: 3 %
Hematocrit: 52.2 % — ABNORMAL HIGH (ref 37.5–51.0)
Hemoglobin: 16 g/dL (ref 13.0–17.7)
Immature Grans (Abs): 0 10*3/uL (ref 0.0–0.1)
Immature Granulocytes: 0 %
Lymphocytes Absolute: 1.6 10*3/uL (ref 0.7–3.1)
Lymphs: 41 %
MCH: 26.9 pg (ref 26.6–33.0)
MCHC: 30.7 g/dL — ABNORMAL LOW (ref 31.5–35.7)
MCV: 88 fL (ref 79–97)
Monocytes Absolute: 0.5 10*3/uL (ref 0.1–0.9)
Monocytes: 13 %
Neutrophils Absolute: 1.7 10*3/uL (ref 1.4–7.0)
Neutrophils: 43 %
Platelets: 215 10*3/uL (ref 150–450)
RBC: 5.94 x10E6/uL — ABNORMAL HIGH (ref 4.14–5.80)
RDW: 13.1 % (ref 11.6–15.4)
WBC: 3.9 10*3/uL (ref 3.4–10.8)

## 2023-01-15 LAB — COMPREHENSIVE METABOLIC PANEL
ALT: 23 IU/L (ref 0–44)
AST: 25 IU/L (ref 0–40)
Albumin: 4.1 g/dL (ref 3.9–4.9)
Alkaline Phosphatase: 70 IU/L (ref 44–121)
BUN/Creatinine Ratio: 10 (ref 10–24)
BUN: 11 mg/dL (ref 8–27)
Bilirubin Total: 0.5 mg/dL (ref 0.0–1.2)
CO2: 23 mmol/L (ref 20–29)
Calcium: 9.8 mg/dL (ref 8.6–10.2)
Chloride: 101 mmol/L (ref 96–106)
Creatinine, Ser: 1.11 mg/dL (ref 0.76–1.27)
Globulin, Total: 3.3 g/dL (ref 1.5–4.5)
Glucose: 76 mg/dL (ref 70–99)
Potassium: 4.8 mmol/L (ref 3.5–5.2)
Sodium: 144 mmol/L (ref 134–144)
Total Protein: 7.4 g/dL (ref 6.0–8.5)
eGFR: 72 mL/min/{1.73_m2} (ref >59)

## 2023-01-15 LAB — LIPID PANEL
Cholesterol, Total: 157 mg/dL (ref 100–199)
HDL: 54 mg/dL (ref >39)
LDL CALC COMMENT:: 2.9 ratio (ref 0.0–5.0)
LDL Chol Calc (NIH): 83 mg/dL (ref 0–99)
Triglycerides: 109 mg/dL (ref 0–149)
VLDL Cholesterol Cal: 20 mg/dL (ref 5–40)

## 2023-01-15 LAB — CORTISOL: Cortisol: 8 ug/dL (ref 6.2–19.4)

## 2023-01-15 LAB — VITAMIN D 25 HYDROXY (VIT D DEFICIENCY, FRACTURES): Vit D, 25-Hydroxy: 19.6 ng/mL — ABNORMAL LOW (ref 30.0–100.0)

## 2023-01-15 LAB — PSA: Prostate Specific Ag, Serum: 1 ng/mL (ref 0.0–4.0)

## 2023-01-15 MED ORDER — ASPIRIN 81 MG PO TBEC: 81.0000 mg | DELAYED_RELEASE_TABLET | Freq: Every day | ORAL | 3 refills | Status: DC

## 2023-01-15 MED ORDER — VITAMIN D (ERGOCALCIFEROL) 1.25 MG (50000 UNIT) PO CAPS: 50000.0000 [IU] | ORAL_CAPSULE | ORAL | 3 refills | Status: DC

## 2023-01-15 NOTE — Progress Notes (Signed)
Vitamin D level is low. I would like you to begin prescription Vitamin D 50,000 IU weekly.  We will plan to check vitamin D periodically along with calcium, such as repeating labs in 3 months.  Foods that contain vitamin D include seafood such as oysters, shrimp, salmon, herring, cod, egg yolks, mushrooms, milk, and foods fortified with Vitamin D such as orange juice, cereals.  Its also important to get some sun exposure regularly to absorb vitamin D.  Still pending hemoglobin A1c diabetes marker.  Your prostate marker was normal.  Liver kidney and electrolytes normal.  Cholesterol looks okay.  Red cells are slightly elevated.  Not sure exactly what is going on here but given the hives, this could be a related factor.  I would recommend donating blood at [facility] or [facility] to dilute your blood counts a little bit which could possibly resolve the hives as well.  There are several reasons or factors to cause elevated blood counts.  But this is the way I would approach this initially.  The other option for additional recommendation would be to see allergist.  Let me know if agreeable to referral to allergist given the hives  Regarding cholesterol, I would recommend as we discussed changing from simvastatin to Crestor or Lipitor for better lowering of the cholesterol.  Let me know if agreeable to this change

## 2023-01-17 LAB — SPECIMEN STATUS REPORT

## 2023-01-17 LAB — HEMOGLOBIN A1C
Est. average glucose Bld gHb Est-mCnc: 120 mg/dL
Hgb A1c MFr Bld: 5.8 % — ABNORMAL HIGH (ref 4.8–5.6)

## 2023-01-17 NOTE — Progress Notes (Signed)
Diabetes marker is in the prediabetic range 5.8%.  Continue to eat healthy diet, avoid junk food and sweets, avoid sugary drinks, and exercise regularly

## 2023-01-18 ENCOUNTER — Other Ambulatory Visit: Payer: Self-pay | Admitting: Medical

## 2023-01-18 DIAGNOSIS — L509 Urticaria, unspecified: Secondary | ICD-10-CM

## 2023-01-18 MED ORDER — HYDROXYZINE HCL 10 MG PO TABS: 10.0000 mg | ORAL_TABLET | Freq: Three times a day (TID) | ORAL | 0 refills | Status: DC | PRN

## 2023-01-18 MED ORDER — ROSUVASTATIN CALCIUM 40 MG PO TABS: 40.0000 mg | ORAL_TABLET | Freq: Every day | ORAL | 0 refills | Status: DC

## 2023-01-18 NOTE — Telephone Encounter (Signed)
 This was discontinued and switched to Crestor

## 2023-01-18 NOTE — Progress Notes (Signed)
I sent refill hydroxyzine given the hives.  Begin Crestor and stop simvastatin  I put in a referral to allergist.  In the meantime just to see if this makes a difference have him stop the [place].  It is possible that one of his medications could be causing problems.  Lets try stopping this first for a couple weeks and see if the hives go away or come back  Have him call back within 2 weeks to let me know how do not after stopping the [place]

## 2023-02-04 ENCOUNTER — Telehealth: Payer: Self-pay | Admitting: Medical

## 2023-02-04 ENCOUNTER — Encounter: Payer: Self-pay | Admitting: Allergy

## 2023-02-04 ENCOUNTER — Other Ambulatory Visit: Payer: Self-pay

## 2023-02-04 ENCOUNTER — Other Ambulatory Visit: Payer: Self-pay | Admitting: Medical

## 2023-02-04 ENCOUNTER — Ambulatory Visit: Payer: No Typology Code available for payment source | Admitting: Allergy

## 2023-02-04 VITALS — BP 110/80 | HR 70 | Temp 97.2°F | Resp 16 | Ht 67.72 in | Wt 163.3 lb

## 2023-02-04 DIAGNOSIS — L508 Other urticaria: Secondary | ICD-10-CM

## 2023-02-04 DIAGNOSIS — L509 Urticaria, unspecified: Secondary | ICD-10-CM

## 2023-02-04 MED ORDER — FAMOTIDINE 20 MG PO TABS: 20.0000 mg | ORAL_TABLET | Freq: Two times a day (BID) | ORAL | 2 refills | Status: DC

## 2023-02-04 MED ORDER — FEXOFENADINE HCL 180 MG PO TABS: 180.0000 mg | ORAL_TABLET | Freq: Two times a day (BID) | ORAL | 2 refills | Status: DC

## 2023-02-04 MED ORDER — HYDROXYZINE HCL 10 MG PO TABS: 10.0000 mg | ORAL_TABLET | Freq: Three times a day (TID) | ORAL | 0 refills | Status: DC | PRN

## 2023-02-04 NOTE — Telephone Encounter (Signed)
Pt informed rx sent in, They were able to get in with allergist today and they are there now

## 2023-02-04 NOTE — Telephone Encounter (Signed)
Med for his allergic reaction not helping, only relief he gets is with ice  Does not see allergist until 1/30  is there anything else you can prescribe

## 2023-02-04 NOTE — Patient Instructions (Signed)
Chronic Urticaria and Angioedema Daily hives and swelling since December 2024. No identifiable triggers. Hives are migratory. Hives are worse in pressure areas. Hydroxyzine provided initial relief but is no longer effective and causes excessive sedation. -Order labs for chronic urticaria workup including thyroid studies, autoimmune markers, environmental allergens, and red meat allergy. -Can use Hydroxyzine as needed at bedtime if having itching or swelling interfering with sleep. -Start Allegra 180mg  1 tab twice a day (up to 4 tablets daily as needed) and Pepcid 20mg  1 tab twice daily for hive control. -Consider adding Singulair or starting Xolair injections if high-dose antihistamine regimen is not effective.  Let me know if the above Allegra and Pepcid regimen is not effective enough in controlling hives.   Follow-up in 2-3 months or sooner if needed

## 2023-02-04 NOTE — Progress Notes (Signed)
New Patient Note  RE: Adrian Willis MRN: [id number] DOB: July 16, 1955 Date of Office Visit: 02/04/2023  Primary care provider: Jac Canavan, PA-C  Chief Complaint: hives, swelling  History of present illness: Adrian Willis is a 68 y.o. male presenting today for evaluation of urticaria with angioedema.  He presents today with his wife. Discussed the use of AI scribe software for clinical note transcription with the patient, who gave verbal consent to proceed.  The patient, with no prior history of hives or swelling, began experiencing these symptoms daily around December 9th or 10th. Initially, the hives would disappear during the day and reappear at night. However, over time, the hives persisted throughout the day. The patient sought medical attention a week after the onset of symptoms, but the hives had temporarily disappeared at the time of the visit. The patient was prescribed hydroxyzine, which initially seemed to help but eventually became ineffective as the hives worsened.  The hives are primarily located on the patient's arm, wrist, feet, and palms, with severe itching particularly on the arm. The patient has noticed some discoloration and scarring due to itching and and scratching especially over the area on his shoulder where he states he was scratching a lot. The only relief from the itching has been the application of ice. The patient has not identified any specific triggers for the hives, despite attempts to identify potential causes such as diet or detergents.  The patient has not been ill in the month prior to the onset of the hives. However, he did receive a COVID-19 vaccine and a flu shot in November. The patient has also noted a sensation similar to heartburn since he has had these hives..  The patient has tried various over-the-counter medications, including Allegra which she started yesterday and Benadryl, with limited success. The patient travels for work to  [place] often and plans to go next week and is concerned about managing the hives during this time.      Review of systems: 10pt ROS negative unless noted above in HPI  All other systems negative unless noted above in HPI  Past medical history: Past Medical History:  Diagnosis Date   BPH (benign prostatic hyperplasia)    CHF (congestive heart failure) (HCC)    CVA (cerebral vascular accident) (HCC) 2011   numbness right index finger and right thumb and right thigh   GERD (gastroesophageal reflux disease)    Hyperlipidemia    Hypertension     Past surgical history: Past Surgical History:  Procedure Laterality Date   CARPAL TUNNEL RELEASE Right    CYSTOSCOPY WITH INSERTION OF UROLIFT     LASIK     thumb     right, nerve injury repair   TRANSURETHRAL RESECTION OF PROSTATE N/A 12/03/2019   Procedure: TRANSURETHRAL RESECTION OF THE PROSTATE (TURP), BIPOLAR;  Surgeon: Jannifer Hick, MD;  Location: [facility];  Service: Urology;  Laterality: N/A;  ONLY NEEDS 60 MIN    Family history:  Family History  Problem Relation Age of Onset   Hypertension Mother    Benign prostatic hyperplasia Father    Other Father        died after horrible bed sore and demise   Cancer Sister    Heart disease Neg Hx    Stroke Neg Hx     Social history: Lives in a home with carpeting in the family room with gas heating and central cooling.  No pets in the home.  There is  no concern for water damage, mildew or roaches in the home.  He works in Insurance account manager.  He denies a smoking history.   Medication List: Current Outpatient Medications  Medication Sig Dispense Refill   ascorbic acid (VITAMIN C) 1000 MG tablet Take 1,000 mg by mouth daily.     aspirin EC (ASPIRIN LOW DOSE) 81 MG tablet Take 1 tablet (81 mg total) by mouth 5 (five) times daily. SWALLOW WHOLE. 90 tablet 3   famotidine (PEPCID) 20 MG tablet Take 1 tablet (20 mg total) by mouth 2 (two) times daily. 60 tablet 2   FARXIGA 10 MG TABS tablet  TAKE 1 TABLET BY MOUTH DAILY BEFORE BREAKFAST. 90 tablet 3   fexofenadine (ALLEGRA ALLERGY) 180 MG tablet Take 1 tablet (180 mg total) by mouth 2 (two) times daily. 60 tablet 2   hydrocortisone cream 1 % Apply 1 Application topically 2 (two) times daily.     hydrOXYzine (ATARAX) 10 MG tablet Take 1 tablet (10 mg total) by mouth 3 (three) times daily as needed. 90 tablet 0   rosuvastatin (CRESTOR) 40 MG tablet Take 1 tablet (40 mg total) by mouth daily. 90 tablet 0   sacubitril-valsartan (ENTRESTO) 97-103 MG Take 1 tablet by mouth 2 (two) times daily. 180 tablet 3   Vitamin D, Ergocalciferol, (DRISDOL) 1.25 MG (50000 UNIT) CAPS capsule Take 1 capsule (50,000 Units total) by mouth every 7 (seven) days. 12 capsule 3   metoprolol succinate (TOPROL-XL) 25 MG 24 hr tablet TAKE 1 TABLET (25 MG TOTAL) BY MOUTH DAILY. (Patient not taking: Reported on 02/04/2023) 30 tablet 2   nitroGLYCERIN (NITROSTAT) 0.4 MG SL tablet Place 1 tablet (0.4 mg total) under the tongue every 5 (five) minutes as needed for chest pain. If you require more than two tablets five minutes apart go to the nearest ER via EMS. 30 tablet 0   No current facility-administered medications for this visit.    Known medication allergies: No Known Allergies   Physical examination: Blood pressure 110/80, pulse 70, temperature (!) 97.2 F (36.2 C), resp. rate 16, height 5' 7.72" (1.72 m), weight 163 lb 4.8 oz (74.1 kg), SpO2 99%.  General: Alert, interactive, in no acute distress. HEENT: PERRLA, TMs pearly gray, turbinates non-edematous without discharge, post-pharynx non erythematous. Neck: Supple without lymphadenopathy. Lungs: Clear to auscultation without wheezing, rhonchi or rales. {no increased work of breathing. CV: Normal S1, S2 without murmurs. Abdomen: Nondistended, nontender. Skin: Scattered erythematous urticarial type lesions primarily located right flank, left palm, behind left ear , nonvesicular. Extremities:  No clubbing,  cyanosis or edema. Neuro:   Grossly intact.  Diagnositics/Labs: Labs:  Component     Latest Ref Rng 01/14/2023  WBC     3.4 - 10.8 x10E3/uL 3.9   RBC     4.14 - 5.80 x10E6/uL 5.94 (H)   Hemoglobin     13.0 - 17.7 g/dL 95.6   HCT     21.3 - 08.6 % 52.2 (H)   MCV     79 - 97 fL 88   MCH     26.6 - 33.0 pg 26.9   MCHC     31.5 - 35.7 g/dL 57.8 (L)   RDW     46.9 - 15.4 % 13.1   Platelets     150 - 450 x10E3/uL 215   Neutrophils     Not Estab. % 43   Lymphs     Not Estab. % 41   Monocytes     Not  Estab. % 13   Eos     Not Estab. % 3   Basos     Not Estab. % 0   NEUT#     1.4 - 7.0 x10E3/uL 1.7   Lymphs Abs     0.7 - 3.1 x10E3/uL 1.6   Monocytes Absolute     0.1 - 0.9 x10E3/uL 0.5   EOS (ABSOLUTE)     0.0 - 0.4 x10E3/uL 0.1   Basophils Absolute     0.0 - 0.2 x10E3/uL 0.0   Immature Granulocytes     Not Estab. % 0   Immature Grans (Abs)     0.0 - 0.1 x10E3/uL 0.0   Glucose     70 - 99 mg/dL 76   BUN     8 - 27 mg/dL 11   Creatinine     0.63 - 1.27 mg/dL 0.16   eGFR     >01 UX/NAT/5.57 72   BUN/Creatinine Ratio     10 - 24  10   Sodium     134 - 144 mmol/L 144   Potassium     3.5 - 5.2 mmol/L 4.8   Chloride     96 - 106 mmol/L 101   CO2     20 - 29 mmol/L 23   Calcium     8.6 - 10.2 mg/dL 9.8   Total Protein     6.0 - 8.5 g/dL 7.4   Albumin     3.9 - 4.9 g/dL 4.1   Globulin, Total     1.5 - 4.5 g/dL 3.3   Total Bilirubin     0.0 - 1.2 mg/dL 0.5   Alkaline Phosphatase     44 - 121 IU/L 70   AST     0 - 40 IU/L 25   ALT     0 - 44 IU/L 23     Assessment and plan: Chronic Urticaria and Angioedema Daily hives and swelling since December 2024. No identifiable triggers. Hives are migratory. Hives are worse in pressure areas. Hydroxyzine provided initial relief but is no longer effective and causes excessive sedation. -Order labs for chronic urticaria workup including thyroid studies, autoimmune markers, environmental allergens, and red meat  allergy. -Can use Hydroxyzine as needed at bedtime if having itching or swelling interfering with sleep. -Start Allegra 180mg  1 tab twice a day (up to 4 tablets daily as needed) and Pepcid 20mg  1 tab twice daily for hive control. -Consider adding Singulair or starting Xolair injections if high-dose antihistamine regimen is not effective.  Let me know if the above Allegra and Pepcid regimen is not effective enough in controlling hives.   Follow-up in 2-3 months or sooner if needed  I appreciate the opportunity to take part in Odis's care. Please do not hesitate to contact me with questions.  Sincerely,   Margo Aye, MD Allergy/Immunology [facility]

## 2023-02-11 ENCOUNTER — Telehealth: Payer: Self-pay | Admitting: Allergy

## 2023-02-11 NOTE — Telephone Encounter (Signed)
Patient wife called and stated patient was seen last week and have not received a call back regarding his results for his Allergy Testing.  Best Contact: [phone]

## 2023-02-11 NOTE — Telephone Encounter (Signed)
Labs have not been resulted yet. We will call patient as soon as they have been released from the provider.

## 2023-02-16 LAB — ALLERGENS W/TOTAL IGE AREA 2

## 2023-02-16 LAB — CHRONIC URTICARIA: cu index: <2.3 (ref <10)

## 2023-02-16 LAB — THYROID ANTIBODIES
Thyroglobulin Antibody: <1 [IU]/mL (ref 0.0–0.9)
Thyroperoxidase Ab SerPl-aCnc: 17 [IU]/mL (ref 0–34)

## 2023-02-16 LAB — ALPHA-GAL PANEL
Allergen Lamb IgE: <0.1 kU/L
Beef IgE: <0.1 kU/L
IgE (Immunoglobulin E), Serum: 141 [IU]/mL (ref 6–495)
O215-IgE Alpha-Gal: <0.1 kU/L
Pork IgE: <0.1 kU/L

## 2023-02-16 LAB — TSH: TSH: 0.459 u[IU]/mL (ref 0.450–4.500)

## 2023-02-16 LAB — TRYPTASE: Tryptase: 5.5 ug/L (ref 2.2–13.2)

## 2023-02-17 ENCOUNTER — Ambulatory Visit: Payer: Medicare Other | Admitting: Allergy

## 2023-02-18 ENCOUNTER — Other Ambulatory Visit: Payer: Self-pay | Admitting: Allergy

## 2023-02-18 MED ORDER — MONTELUKAST SODIUM 10 MG PO TABS: 10.0000 mg | ORAL_TABLET | Freq: Every day | ORAL | 5 refills | Status: DC

## 2023-02-18 NOTE — Telephone Encounter (Signed)
Called patient and he expressed that he is in discomfort with the hives. Patient is currently out of town but he scheduled an appointment for Monday. He is currently taking the allegra and Pepcid. He is currently using ice on the skin to help with the itching and discomfort.   Patient also requested to have labs dictated and get started on Xolair ASAP as he is suffering.   Please advise if and what patient can do until his appointment Monday.

## 2023-02-18 NOTE — Progress Notes (Signed)
 See telephone encounter.

## 2023-02-18 NOTE — Telephone Encounter (Addendum)
Patient called stating he is covered in hives from head to toe. Patient is wondering if the labs have come back and wanted to let Dr. Delorse Lek know he is interested in Xolair.   Patient is wondering what he can do to get some relief at this time. He is doing the Allegra and Pepcid regimen without relief.

## 2023-02-20 NOTE — Progress Notes (Unsigned)
Follow Up Note  RE: Adrian Willis MRN: [id number] DOB: 1955/10/20 Date of Office Visit: 02/21/2023  Referring provider: Jac Canavan, PA-C Primary care provider: Jac Canavan, PA-C  Chief Complaint: No chief complaint on file.  History of Present Illness: I had the pleasure of seeing Adrian Willis for a follow up visit at the [facility] on 02/20/2023. He is a 68 y.o. male, who is being followed for chronic urticaria with angioedema. His previous allergy office visit was on 02/04/2023 with Dr. Delorse Lek. Today is a new complaint visit of persistent hives .  Discussed the use of AI scribe software for clinical note transcription with the patient, who gave verbal consent to proceed.  History of Present Illness            I have notified [name] to go ahead with Xolair approval.  He has been instructed on max dosing of his antihistamine and is already on pepcid.  I will send in singulair for him to get started on too while awaiting Xolair.   Assessment and Plan: [name] is a 68 y.o. male with: Chronic Urticaria and Angioedema Daily hives and swelling since December 2024. No identifiable triggers. Hives are migratory. Hives are worse in pressure areas. Hydroxyzine provided initial relief but is no longer effective and causes excessive sedation. -Order labs for chronic urticaria workup including thyroid studies, autoimmune markers, environmental allergens, and red meat allergy. -Can use Hydroxyzine as needed at bedtime if having itching or swelling interfering with sleep. -Start Allegra 180mg  1 tab twice a day (up to 4 tablets daily as needed) and Pepcid 20mg  1 tab twice daily for hive control. -Consider adding Singulair or starting Xolair injections if high-dose antihistamine regimen is not effective.  Let me know if the above Allegra and Pepcid regimen is not effective enough in controlling hives.  Assessment and Plan              No  follow-ups on file.  No orders of the defined types were placed in this encounter.  Lab Orders  No laboratory test(s) ordered today    Diagnostics: Spirometry:  Tracings reviewed. His effort: {Blank single:19197::"Good reproducible efforts.","It was hard to get consistent efforts and there is a question as to whether this reflects a maximal maneuver.","Poor effort, data can not be interpreted."} FVC: ***L FEV1: ***L, ***% predicted FEV1/FVC ratio: ***% Interpretation: {Blank single:19197::"Spirometry consistent with mild obstructive disease","Spirometry consistent with moderate obstructive disease","Spirometry consistent with severe obstructive disease","Spirometry consistent with possible restrictive disease","Spirometry consistent with mixed obstructive and restrictive disease","Spirometry uninterpretable due to technique","Spirometry consistent with normal pattern","No overt abnormalities noted given today's efforts"}.  Please see scanned spirometry results for details.  Skin Testing: {Blank single:19197::"Select foods","Environmental allergy panel","Environmental allergy panel and select foods","Food allergy panel","None","Deferred due to recent antihistamines use"}. *** Results discussed with patient/family.   Medication List:  Current Outpatient Medications  Medication Sig Dispense Refill   ascorbic acid (VITAMIN C) 1000 MG tablet Take 1,000 mg by mouth daily.     aspirin EC (ASPIRIN LOW DOSE) 81 MG tablet Take 1 tablet (81 mg total) by mouth 5 (five) times daily. SWALLOW WHOLE. 90 tablet 3   famotidine (PEPCID) 20 MG tablet Take 1 tablet (20 mg total) by mouth 2 (two) times daily. 60 tablet 2   FARXIGA 10 MG TABS tablet TAKE 1 TABLET BY MOUTH DAILY BEFORE BREAKFAST. 90 tablet 3   fexofenadine (ALLEGRA ALLERGY) 180 MG tablet Take 1 tablet (180 mg total) by  mouth 2 (two) times daily. 60 tablet 2   hydrocortisone cream 1 % Apply 1 Application topically 2 (two) times daily.      hydrOXYzine (ATARAX) 10 MG tablet Take 1 tablet (10 mg total) by mouth 3 (three) times daily as needed. 90 tablet 0   metoprolol succinate (TOPROL-XL) 25 MG 24 hr tablet TAKE 1 TABLET (25 MG TOTAL) BY MOUTH DAILY. (Patient not taking: Reported on 02/04/2023) 30 tablet 2   montelukast (SINGULAIR) 10 MG tablet Take 1 tablet (10 mg total) by mouth at bedtime. For hive control 30 tablet 5   nitroGLYCERIN (NITROSTAT) 0.4 MG SL tablet Place 1 tablet (0.4 mg total) under the tongue every 5 (five) minutes as needed for chest pain. If you require more than two tablets five minutes apart go to the nearest ER via EMS. 30 tablet 0   rosuvastatin (CRESTOR) 40 MG tablet Take 1 tablet (40 mg total) by mouth daily. 90 tablet 0   sacubitril-valsartan (ENTRESTO) 97-103 MG Take 1 tablet by mouth 2 (two) times daily. 180 tablet 3   Vitamin D, Ergocalciferol, (DRISDOL) 1.25 MG (50000 UNIT) CAPS capsule Take 1 capsule (50,000 Units total) by mouth every 7 (seven) days. 12 capsule 3   No current facility-administered medications for this visit.   Allergies: No Known Allergies I reviewed his past medical history, social history, family history, and environmental history and no significant changes have been reported from his previous visit.  Review of Systems  Constitutional:  Negative for appetite change, chills, fever and unexpected weight change.  HENT:  Negative for congestion and rhinorrhea.   Eyes:  Negative for itching.  Respiratory:  Negative for cough, chest tightness, shortness of breath and wheezing.   Cardiovascular:  Negative for chest pain.  Gastrointestinal:  Negative for abdominal pain.  Genitourinary:  Negative for difficulty urinating.  Skin:  Negative for rash.  Neurological:  Negative for headaches.    Objective: There were no vitals taken for this visit. There is no height or weight on file to calculate BMI. Physical Exam Vitals and nursing note reviewed.  Constitutional:      Appearance:  Normal appearance. He is well-developed.  HENT:     Head: Normocephalic and atraumatic.     Right Ear: Tympanic membrane and external ear normal.     Left Ear: Tympanic membrane and external ear normal.     Nose: Nose normal.     Mouth/Throat:     Mouth: Mucous membranes are moist.     Pharynx: Oropharynx is clear.  Eyes:     Conjunctiva/sclera: Conjunctivae normal.  Cardiovascular:     Rate and Rhythm: Normal rate and regular rhythm.     Heart sounds: Normal heart sounds. No murmur heard.    No friction rub. No gallop.  Pulmonary:     Effort: Pulmonary effort is normal.     Breath sounds: Normal breath sounds. No wheezing, rhonchi or rales.  Musculoskeletal:     Cervical back: Neck supple.  Skin:    General: Skin is warm.     Findings: No rash.  Neurological:     Mental Status: He is alert and oriented to person, place, and time.  Psychiatric:        Behavior: Behavior normal.    Previous notes and tests were reviewed. The plan was reviewed with the patient/family, and all questions/concerned were addressed.  It was my pleasure to see Antar today and participate in his care. Please feel free to contact me  with any questions or concerns.  Sincerely,  Wyline Mood, DO Allergy & Immunology  [facility] office: [phone] [facility] office: [phone]

## 2023-02-21 ENCOUNTER — Ambulatory Visit: Payer: No Typology Code available for payment source | Admitting: Allergy

## 2023-02-21 ENCOUNTER — Other Ambulatory Visit: Payer: Self-pay

## 2023-02-21 ENCOUNTER — Encounter: Payer: Self-pay | Admitting: Allergy

## 2023-02-21 VITALS — BP 110/76 | HR 73 | Temp 98.0°F | Resp 18 | Ht 66.75 in | Wt 165.3 lb

## 2023-02-21 DIAGNOSIS — L508 Other urticaria: Secondary | ICD-10-CM

## 2023-02-21 MED ORDER — EPINEPHRINE 0.3 MG/0.3ML IJ SOAJ
0.3000 mg | INTRAMUSCULAR | 1 refills | Status: AC | PRN
Start: 2023-02-21 — End: ?

## 2023-02-21 MED ORDER — PREDNISONE 20 MG PO TABS: 20.0000 mg | ORAL_TABLET | Freq: Every day | ORAL | 0 refills | Status: DC

## 2023-02-21 NOTE — Telephone Encounter (Addendum)
Per Provider:  I have notified [name] to go ahead with Xolair approval.   He has been instructed on max dosing of his antihistamine and is already on pepcid.   I will send in singulair for him to get started on too while awaiting Xolair.   Called patient - DOB verified - advised of the above provider notation.  Patient has appt. @ 3:30 pm w/Dr. Selena Batten regarding hives.

## 2023-02-21 NOTE — Patient Instructions (Addendum)
Hives: Start prednisone 20mg  once a day for 5 days.  Start Xolair 300mg  injections every 4 weeks. Xolair paperwork signed. Come tomorrow at 11AM for the sample dose. Please call on your way here so we can defrost the medication. Please plan on being here for 1 hour after the injection.  I have prescribed epinephrine injectable device and demonstrated proper use. For mild symptoms you can take over the counter antihistamines such as Benadryl 1-2 tablets = 25-50mg  and monitor symptoms closely. If symptoms worsen or if you have severe symptoms including breathing issues, throat closure, significant swelling, whole body hives, severe diarrhea and vomiting, lightheadedness then inject epinephrine and seek immediate medical care afterwards. Emergency action plan given.  Increase allegra (fexofenadine) 180mg  (2 tablets) twice a day. If symptoms are not controlled or causes drowsiness let us know. Continue Pepcid (famotidine) 20mg  twice a day.  Avoid the following potential triggers: alcohol, tight clothing, NSAIDs, hot showers and getting overheated. Continue proper skin care.   Return in about 2 months (around 04/21/2023). Or sooner if needed. With Dr. Delorse Lek.  Skin care recommendations  Bath time: Always use lukewarm water. AVOID very hot or cold water. Keep bathing time to 5-10 minutes. Do NOT use bubble bath. Use a mild soap and use just enough to wash the dirty areas. Do NOT scrub skin vigorously.  After bathing, pat dry your skin with a towel. Do NOT rub or scrub the skin.  Moisturizers and prescriptions:  ALWAYS apply moisturizers immediately after bathing (within 3 minutes). This helps to lock-in moisture. Use the moisturizer several times a day over the whole body. Good summer moisturizers include: Aveeno, CeraVe, Cetaphil. Good winter moisturizers include: Aquaphor, Vaseline, Cerave, Cetaphil, Eucerin, Vanicream. When using moisturizers along with medications, the moisturizer  should be applied about one hour after applying the medication to prevent diluting effect of the medication or moisturize around where you applied the medications. When not using medications, the moisturizer can be continued twice daily as maintenance.  Laundry and clothing: Avoid laundry products with added color or perfumes. Use unscented hypo-allergenic laundry products such as Tide free, Cheer free & gentle, and All free and clear.  If the skin still seems dry or sensitive, you can try double-rinsing the clothes. Avoid tight or scratchy clothing such as wool. Do not use fabric softeners or dyer sheets.

## 2023-02-22 ENCOUNTER — Ambulatory Visit (INDEPENDENT_AMBULATORY_CARE_PROVIDER_SITE_OTHER): Payer: No Typology Code available for payment source

## 2023-02-22 DIAGNOSIS — L509 Urticaria, unspecified: Secondary | ICD-10-CM | POA: Diagnosis not present

## 2023-02-22 MED ORDER — OMALIZUMAB 300 MG/2  ML ~~LOC~~ SOSY
300.0000 mg | PREFILLED_SYRINGE | Freq: Once | SUBCUTANEOUS | Status: AC
  Administered 2023-02-22: 300 mg via SUBCUTANEOUS

## 2023-02-22 NOTE — Progress Notes (Signed)
 Immunotherapy   Patient Details  Name: Adrian Willis MRN: [id number] Date of Birth: October 10, 1955  02/22/2023  [name] hives. Patient received a 300 mg sample dose of Xolair . Patient waited an hour with no problems.  Epi-Pen:Epi-Pen Available  Consent signed and patient instructions given.   [name] 02/22/2023, 10:03 AM

## 2023-02-28 ENCOUNTER — Telehealth: Payer: Self-pay | Admitting: *Deleted

## 2023-02-28 NOTE — Telephone Encounter (Signed)
-----   Message from [employer] Padgett sent at 02/18/2023  4:36 PM EST ----- Please let pt know the following: -Thyroid  studies are normal -alpha gal is negative thus does not have red meat allergy -typtase is normal thus does not have 'hyperactive' allergy cells -chronic hive index is normal thus does not have direct autoimmune form of hives -environmental allergy panel is negative  Khaalid Lefkowitz- go ahead with Xolair  approval

## 2023-02-28 NOTE — Telephone Encounter (Signed)
 L/m for patient to advised approval, copay card and submit to [facility]

## 2023-03-11 NOTE — Telephone Encounter (Signed)
L/m for patient again to contact me ?

## 2023-03-14 NOTE — Telephone Encounter (Signed)
 Spoke to patient and advised approval and submit to [employer] will reach back out to him to make appt for next injection once delivery set

## 2023-03-21 ENCOUNTER — Other Ambulatory Visit: Payer: Self-pay | Admitting: Medical

## 2023-03-21 DIAGNOSIS — I5020 Unspecified systolic (congestive) heart failure: Secondary | ICD-10-CM

## 2023-03-22 ENCOUNTER — Ambulatory Visit: Payer: No Typology Code available for payment source

## 2023-04-01 ENCOUNTER — Ambulatory Visit (INDEPENDENT_AMBULATORY_CARE_PROVIDER_SITE_OTHER): Admitting: *Deleted

## 2023-04-01 DIAGNOSIS — L501 Idiopathic urticaria: Secondary | ICD-10-CM | POA: Diagnosis not present

## 2023-04-01 MED ORDER — OMALIZUMAB 150 MG/ML ~~LOC~~ SOSY
300.0000 mg | PREFILLED_SYRINGE | SUBCUTANEOUS | Status: AC
  Administered 2023-04-01 – 2024-01-18 (×9): 300 mg via SUBCUTANEOUS

## 2023-04-18 ENCOUNTER — Other Ambulatory Visit: Payer: Self-pay | Admitting: Medical

## 2023-04-28 ENCOUNTER — Ambulatory Visit

## 2023-04-28 DIAGNOSIS — L501 Idiopathic urticaria: Secondary | ICD-10-CM | POA: Diagnosis not present

## 2023-05-05 ENCOUNTER — Ambulatory Visit: Payer: Medicare Other | Admitting: Allergy

## 2023-05-12 ENCOUNTER — Ambulatory Visit (INDEPENDENT_AMBULATORY_CARE_PROVIDER_SITE_OTHER): Admitting: Family Medicine

## 2023-05-12 ENCOUNTER — Other Ambulatory Visit: Payer: Self-pay

## 2023-05-12 ENCOUNTER — Encounter: Payer: Self-pay | Admitting: Family Medicine

## 2023-05-12 VITALS — BP 112/76 | HR 72 | Temp 97.8°F | Ht 67.32 in | Wt 157.8 lb

## 2023-05-12 DIAGNOSIS — L508 Other urticaria: Secondary | ICD-10-CM | POA: Diagnosis not present

## 2023-05-12 NOTE — Progress Notes (Signed)
 [address] [place] [address] Dept: [phone]  FOLLOW UP NOTE  Patient ID: Adrian Willis, male    DOB: 02-03-1955  Age: 68 y.o. MRN: [id number] Date of Office Visit: 05/12/2023  Assessment  Chief Complaint: Idiopathic urticaria and Follow-up (He reported, he has been doing very well the shot. Little bumps this few days, it itches and it comes go.)  HPI Adrian Willis is a 68 year old male who presents to the clinic for a follow-up visit.  He was last seen in this clinic on 02/21/2023 by Dr. 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  He had a large chronic urticaria workup with normal lab work on 02/04/2023.  At today's visit, he reports that his urticaria has been well-controlled with occasional intermittent hives occurring 1-3 at a time for which he continues Allegra  as needed with relief of symptoms.  He continues Xolair  injections once every 4 weeks with no large or local reactions.  He reports a significant decrease in his symptoms of urticaria while continuing on Xolair  injections.  He is asking what length of time he will need to be on Xolair  for hive control.  We did discuss the possibility of increasing the interval of injections from 4 weeks to 6 weeks when he feels as though his hives are well-controlled.  EpiPen  sent is up-to-date.  His current medications are listed in the chart.  Drug Allergies:  No Known Allergies  Physical Exam: BP 112/76 (BP Location: Right Arm, Patient Position: Sitting, Cuff Size: Normal)   Pulse 72   Temp 97.8 F (36.6 C) (Temporal)   Ht 5' 7.32" (1.71 m)   Wt 157 lb 12.8 oz (71.6 kg)   SpO2 99%   BMI 24.48 kg/m    Physical Exam Vitals reviewed.  Constitutional:      Appearance: Normal appearance.  HENT:     Head: Normocephalic and atraumatic.     Right Ear: Tympanic membrane normal.     Left Ear: Tympanic membrane normal.     Nose:     Comments:  Bilateral nares edematous and pale with thin clear nasal drainage noted.  Pharynx normal.  Ears normal.  Eyes normal.    Mouth/Throat:     Pharynx: Oropharynx is clear.  Eyes:     Conjunctiva/sclera: Conjunctivae normal.  Cardiovascular:     Rate and Rhythm: Normal rate and regular rhythm.     Heart sounds: Normal heart sounds. No murmur heard. Pulmonary:     Effort: Pulmonary effort is normal.     Breath sounds: Normal breath sounds.     Comments: Lungs clear to auscultation Musculoskeletal:        General: Normal range of motion.     Cervical back: Normal range of motion and neck supple.  Skin:    General: Skin is warm and dry.  Neurological:     Mental Status: He is alert and oriented to person, place, and time.  Psychiatric:        Mood and Affect: Mood normal.        Behavior: Behavior normal.        Thought Content: Thought content normal.        Judgment: Judgment normal.    Assessment and Plan: 1. Chronic urticaria     Patient Instructions  Chronic urticaria/angioedema Take the least amount of medications while remaining hive free Fexofenadine  (Allegra ) 180 mg twice a day and famotidine  (Pepcid ) 20 mg  twice a day. If no symptoms for 7-14 days then decrease to. Fexofenadine  (Allegra ) 180 mg twice a day and famotidine  (Pepcid ) 20 mg once a day.  If no symptoms for 7-14 days then decrease to. Fexofenadine  (Allegra ) 180 mg twice a day.  If no symptoms for 7-14 days then decrease to. Fexofenadine  (Allegra ) 180 mg once a day.   Continue Xolair  300 mg injections once every 4 weeks and have access to an epinephrine  autoinjector set per protocol  Call the clinic if this treatment plan is not working well for you.  Follow up in 6 months or sooner if needed.  Return in about 6 months (around 11/11/2023), or if symptoms worsen or fail to improve.    Thank you for the opportunity to care for this patient.  Please do not hesitate to contact me with questions.  Marinus Sic,  FNP [facility] 

## 2023-05-12 NOTE — Patient Instructions (Signed)
 Chronic urticaria/angioedema Take the least amount of medications while remaining hive free Fexofenadine  (Allegra ) 180 mg twice a day and famotidine  (Pepcid ) 20 mg twice a day. If no symptoms for 7-14 days then decrease to. Fexofenadine  (Allegra ) 180 mg twice a day and famotidine  (Pepcid ) 20 mg once a day.  If no symptoms for 7-14 days then decrease to. Fexofenadine  (Allegra ) 180 mg twice a day.  If no symptoms for 7-14 days then decrease to. Fexofenadine  (Allegra ) 180 mg once a day.   Continue Xolair  300 mg injections once every 4 weeks and have access to an epinephrine  autoinjector set per protocol  Call the clinic if this treatment plan is not working well for you.  Follow up in 6 months or sooner if needed.

## 2023-05-26 ENCOUNTER — Ambulatory Visit

## 2023-05-26 DIAGNOSIS — L501 Idiopathic urticaria: Secondary | ICD-10-CM

## 2023-06-10 ENCOUNTER — Telehealth: Payer: Self-pay

## 2023-06-10 ENCOUNTER — Ambulatory Visit
Admission: EM | Admit: 2023-06-10 | Discharge: 2023-06-10 | Disposition: A | Discharge status: Home / Self Care | Attending: Nurse Practitioner | Admitting: Nurse Practitioner

## 2023-06-10 DIAGNOSIS — N50819 Testicular pain, unspecified: Secondary | ICD-10-CM | POA: Diagnosis not present

## 2023-06-10 DIAGNOSIS — M79662 Pain in left lower leg: Secondary | ICD-10-CM

## 2023-06-10 LAB — POCT URINALYSIS DIP (MANUAL ENTRY)
Bilirubin, UA: NEGATIVE
Blood, UA: NEGATIVE
Glucose, UA: >=1000 mg/dL — AB
Ketones, POC UA: NEGATIVE mg/dL
Leukocytes, UA: NEGATIVE
Nitrite, UA: NEGATIVE
Protein Ur, POC: NEGATIVE mg/dL
Spec Grav, UA: 1.02 (ref 1.010–1.025)
Urobilinogen, UA: 0.2 U/dL
pH, UA: 5.5 (ref 5.0–8.0)

## 2023-06-10 LAB — POCT FASTING CBG KUC MANUAL ENTRY: POCT Glucose (KUC): 96 mg/dL (ref 70–99)

## 2023-06-10 MED ORDER — PREDNISONE 20 MG PO TABS
40.0000 mg | ORAL_TABLET | Freq: Every day | ORAL | 0 refills | Status: AC
Start: 2023-06-10 — End: 2023-06-15

## 2023-06-10 NOTE — ED Triage Notes (Signed)
 Pt states burning pain to his left calf for the past 3 days.  Also states he is having pain in his testicles on and off  for the past week.

## 2023-06-10 NOTE — Discharge Instructions (Addendum)
 Take medication as prescribed. Recommend warm Epsom salt soaks to help with pain or discomfort of the left lower leg. You may apply ice or heat as needed.  Apply ice for pain or swelling, heat for spasm or stiffness.  Apply for 20 minutes, remove for 1 hour, repeat as much as possible. Try to remain as active as possible. If symptoms do not improve with this treatment, it is recommended that you follow-up with your primary care physician for further evaluation.  For your testicular symptoms: Your urine does not show any signs of infection; however, you do have glucose in your urine.  I would like for you to follow-up with your primary care physician for further evaluation. As discussed, please follow-up with urology within the next 5 to 7 days for reevaluation of your testicular symptoms. Go to the emergency department immediately if you develop severe pain in the testicles/scrotum, discoloration of the testicles or scrotum, or become unable to urinate. Follow-up as needed.

## 2023-06-10 NOTE — ED Provider Notes (Signed)
 [facility]    CSN: [id number] Arrival date & time: 06/10/23  1013      History   Chief Complaint Chief Complaint  Patient presents with   Leg Pain    HPI ALANDO COLLERAN is a 68 y.o. male.   The history is provided by the patient.   Patient presents with a 3-day history of burning pain in the left calf.  Patient states that prior to her symptoms starting, he was out trimming hedges in his yard.  States he had to increase his speed to complete the work, states that he developed symptoms later that evening.  He denies injury, trauma, numbness, tingling, tenderness to the left lower leg, redness, swelling, or pain with ambulation.  Patient states he has been using avocado oil and rubbing alcohol for treatment.  Denies prior history of blood clot, hypertension, smoking, or recent travel.  Patient also complains of pain in his testicles that have been on and off for the past week.  He states he has had 2 episodes including the 1 from today over the past week.  He does have underlying history of BPH.  States that he did mention ongoing history of testicular tenderness to both his PCP and urologist, but 1 thing was done.  He states he is concerned now because he has pain that radiates from his testicles into his lower abdomen.  Denies testicular/scrotal swelling/redness, urinary frequency, urgency, dysuria, or decreased urine stream.  Past Medical History:  Diagnosis Date   BPH (benign prostatic hyperplasia)    CHF (congestive heart failure) (HCC)    CVA (cerebral vascular accident) (HCC) 2011   numbness right index finger and right thumb and right thigh   GERD (gastroesophageal reflux disease)    Hyperlipidemia    Hypertension     Patient Active Problem List   Diagnosis Date Noted   Chronic urticaria 05/12/2023   Vitamin D  deficiency 01/14/2023   Encounter for health maintenance examination in adult 08/11/2020   Screening for prostate cancer 08/11/2020   HFrEF  (heart failure with reduced ejection fraction) (HCC) 08/11/2020   Hyperlipidemia 07/09/2019   Screen for colon cancer 07/09/2019   Need for pneumococcal vaccination 07/09/2019   Vaccine counseling 07/09/2019   Ataxia 05/24/2019   Essential hypertension, benign 05/24/2019   History of stroke 05/24/2019   Benign prostatic hyperplasia with urinary frequency 05/24/2019   Erectile dysfunction 05/24/2019    Past Surgical History:  Procedure Laterality Date   CARPAL TUNNEL RELEASE Right    CYSTOSCOPY WITH INSERTION OF UROLIFT     LASIK     thumb     right, nerve injury repair   TRANSURETHRAL RESECTION OF PROSTATE N/A 12/03/2019   Procedure: TRANSURETHRAL RESECTION OF THE PROSTATE (TURP), BIPOLAR;  Surgeon: Lahoma Pigg, MD;  Location: [facility];  Service: Urology;  Laterality: N/A;  ONLY NEEDS 60 MIN       Home Medications    Prior to Admission medications   Medication Sig Start Date End Date Taking? Authorizing Provider  ascorbic acid (VITAMIN C) 1000 MG tablet Take 1,000 mg by mouth daily.    [provider]  aspirin  EC (ASPIRIN  LOW DOSE) 81 MG tablet Take 1 tablet (81 mg total) by mouth 5 (five) times daily. SWALLOW WHOLE. 01/15/23   Tysinger, Christiane Cowing, PA-C  dapagliflozin  propanediol (FARXIGA ) 10 MG TABS tablet TAKE 1 TABLET BY MOUTH DAILY BEFORE BREAKFAST. 03/21/23   Tysinger, Christiane Cowing, PA-C  EPINEPHrine  0.3 mg/0.3 mL IJ SOAJ  injection Inject 0.3 mg into the muscle as needed for anaphylaxis. 02/21/23   Trudy Fusi, DO  famotidine  (PEPCID ) 20 MG tablet Take 1 tablet (20 mg total) by mouth 2 (two) times daily. 02/04/23   Brian Campanile, MD  fexofenadine  (ALLEGRA  ALLERGY) 180 MG tablet Take 1 tablet (180 mg total) by mouth 2 (two) times daily. 02/04/23   Brian Campanile, MD  hydrocortisone cream 1 % Apply 1 Application topically 2 (two) times daily.    [provider]  montelukast  (SINGULAIR ) 10 MG tablet Take 1 tablet (10 mg total) by mouth at bedtime.  For hive control Patient not taking: Reported on 05/12/2023 02/18/23   Brian Campanile, MD  nitroGLYCERIN  (NITROSTAT ) 0.4 MG SL tablet Place 1 tablet (0.4 mg total) under the tongue every 5 (five) minutes as needed for chest pain. If you require more than two tablets five minutes apart go to the nearest ER via EMS. 08/01/19 12/28/21  Tolia, Sunit, DO  predniSONE  (DELTASONE ) 20 MG tablet Take 1 tablet (20 mg total) by mouth daily with breakfast. 02/21/23   Trudy Fusi, DO  rosuvastatin  (CRESTOR ) 40 MG tablet TAKE 1 TABLET BY MOUTH EVERY DAY 04/18/23   Tysinger, Christiane Cowing, PA-C  sacubitril -valsartan  (ENTRESTO ) 97-103 MG Take 1 tablet by mouth 2 (two) times daily. 10/08/22   Tolia, Sunit, DO  Vitamin D , Ergocalciferol , (DRISDOL ) 1.25 MG (50000 UNIT) CAPS capsule Take 1 capsule (50,000 Units total) by mouth every 7 (seven) days. Patient not taking: Reported on 05/12/2023 01/15/23   Tysinger, Christiane Cowing, PA-C    Family History Family History  Problem Relation Age of Onset   Hypertension Mother    Benign prostatic hyperplasia Father    Other Father        died after horrible bed sore and demise   Cancer Sister    Heart disease Neg Hx    Stroke Neg Hx     Social History Social History   Tobacco Use   Smoking status: Never   Smokeless tobacco: Never  Vaping Use   Vaping status: Never Used  Substance Use Topics   Alcohol use: Never   Drug use: Never     Allergies   Patient has no known allergies.   Review of Systems Review of Systems Per HPI  Physical Exam Triage Vital Signs ED Triage Vitals  Encounter Vitals Group     BP 06/10/23 1026 127/85     Systolic BP Percentile --      Diastolic BP Percentile --      Pulse Rate 06/10/23 1026 71     Resp 06/10/23 1026 16     Temp 06/10/23 1026 98.1 F (36.7 C)     Temp Source 06/10/23 1026 Oral     SpO2 06/10/23 1026 97 %     Weight --      Height --      Head Circumference --      Peak Flow --      Pain Score 06/10/23 1028 7      Pain Loc --      Pain Education --      Exclude from Growth Chart --    No data found.  Updated Vital Signs BP 127/85 (BP Location: Right Arm)   Pulse 71   Temp 98.1 F (36.7 C) (Oral)   Resp 16   SpO2 97%   Visual Acuity Right Eye Distance:   Left Eye Distance:   Bilateral Distance:  Right Eye Near:   Left Eye Near:    Bilateral Near:     Physical Exam Vitals and nursing note reviewed.  Constitutional:      Appearance: Normal appearance.  HENT:     Head: Normocephalic.  Eyes:     Extraocular Movements: Extraocular movements intact.     Pupils: Pupils are equal, round, and reactive to light.  Cardiovascular:     Rate and Rhythm: Normal rate and regular rhythm.     Pulses: Normal pulses.     Heart sounds: Normal heart sounds.  Pulmonary:     Effort: Pulmonary effort is normal.     Breath sounds: Normal breath sounds.  Abdominal:     General: Bowel sounds are normal.     Palpations: Abdomen is soft.     Tenderness: There is no abdominal tenderness.  Musculoskeletal:     Cervical back: Normal range of motion.     Left lower leg: No swelling, deformity or tenderness. No edema.  Skin:    General: Skin is warm and dry.  Neurological:     General: No focal deficit present.     Mental Status: He is alert and oriented to person, place, and time.  Psychiatric:        Mood and Affect: Mood normal.        Behavior: Behavior normal.      UC Treatments / Results  Labs (all labs ordered are listed, but only abnormal results are displayed) Labs Reviewed  POCT URINALYSIS DIP (MANUAL ENTRY) - Abnormal; Notable for the following components:      Result Value   Glucose, UA >=1,000 (*)    All other components within normal limits  POCT FASTING CBG [facility] MANUAL ENTRY    EKG   Radiology No results found.  Procedures Procedures (including critical care time)  Medications Ordered in UC Medications - No data to display  Initial Impression / Assessment and  Plan / UC Course  I have reviewed the triage vital signs and the nursing notes.  Pertinent labs & imaging results that were available during my care of the patient were reviewed by me and considered in my medical decision making (see chart for details).  Patient with a burning pain in the left calf has been present for the past several days.  On exam, he does not have any erythema, swelling, redness, or tenderness of the left lower leg.  Negative Homans signs.  Symptoms not consistent with DVT.  Symptoms appear to be neuropathic in nature.  Will treat with prednisone  40 mg for the next 5 days.  Urinalysis does show glucose greater than 1000, patient does not have history of diabetes.  Fingerstick glucose was 96.  Will have patient follow-up with his PCP for further evaluation to rule out diabetes.  With regard to his urinary symptoms, patient has had 2 episodes of pain in the past week, symptoms not consistent with testicular torsion.  Will have patient follow-up with his urologist for further evaluation.  Patient was in agreement with this plan of care and verbalizes understanding.  All questions were answered.  Patient stable for discharge.   Final Clinical Impressions(s) / UC Diagnoses   Final diagnoses:  Pain of left calf  Pain in testicle, unspecified laterality   Discharge Instructions   None    ED Prescriptions   None    PDMP not reviewed this encounter.   Hardy Lia, NP 06/10/23 1114

## 2023-06-20 NOTE — Telephone Encounter (Signed)
Pt has had appt.  

## 2023-06-23 ENCOUNTER — Ambulatory Visit

## 2023-06-23 DIAGNOSIS — L501 Idiopathic urticaria: Secondary | ICD-10-CM

## 2023-06-30 ENCOUNTER — Ambulatory Visit: Payer: Self-pay

## 2023-06-30 NOTE — Telephone Encounter (Signed)
 This RN made first attempt to triage patient. No answer, LVM. Routing for additional attempts.   Copied From CRM [id number]. Reason for Triage: Adrian Willis, wife, states that patient has a burning sensation in his foot that hurts more at night. Painful. Patient is not with his wife. Please reach out to patient because I think the pain is worse than he advised his wife. Callback number is [phone]

## 2023-06-30 NOTE — Telephone Encounter (Signed)
 This RN made second attempt to triage patient. No answer, LVM. Routing for additional attempts.

## 2023-06-30 NOTE — Telephone Encounter (Signed)
 This RN made third attempt to triage patient. No answer, LVM. Routing to office for follow-up.

## 2023-07-07 ENCOUNTER — Ambulatory Visit: Admitting: Family Medicine

## 2023-07-07 VITALS — BP 110/80 | HR 70 | Wt 156.0 lb

## 2023-07-07 DIAGNOSIS — R208 Other disturbances of skin sensation: Secondary | ICD-10-CM

## 2023-07-07 DIAGNOSIS — Z131 Encounter for screening for diabetes mellitus: Secondary | ICD-10-CM

## 2023-07-07 DIAGNOSIS — M7582 Other shoulder lesions, left shoulder: Secondary | ICD-10-CM

## 2023-07-07 LAB — POCT GLYCOSYLATED HEMOGLOBIN (HGB A1C): Hemoglobin A1C: 5.6 % (ref 4.0–5.6)

## 2023-07-07 NOTE — Addendum Note (Signed)
 Addended by: Eddye Goodie on: 07/07/2023 02:52 PM   Modules accepted: Orders

## 2023-07-07 NOTE — Patient Instructions (Signed)
 Take 2 Aleve twice per day for the next 10 to 14 days.  You can also do heat to your shoulder for 20 minutes a couple times per day and gentle stretching after that.  If that does not work then call and we will reevaluate and possibly send you to physical therapy or possibly even get an injection

## 2023-07-07 NOTE — Progress Notes (Signed)
   Subjective:    Patient ID: Adrian Willis, male    DOB: 1955/11/26, 68 y.o.   MRN: [id number]  HPI He is here for consult concerning 2 issues.  He states that about a month ago he noted a burning sensation in the left mid calf area numbness or tingling or injury or overuse.  He did go to an urgent care center for evaluation of this and apparently a urine screen showed glucose and he was told to come here for further testing.  He also complains of difficulty with left shoulder pain.  In the past he was given an NSAID for different reason they noted that it did not indeed help his left shoulder.  He notes difficulty mainly when he abducts his shoulder.   Review of Systems     Objective:    Physical Exam Exam of the left shoulder does show some limitation of range of motion.  Neer's and Hawkins test was uncomfortable.  Negative sulcus sign.  Exam of the left lower extremity show normal pulses and questionable decreased sensation over the mid calf area medially as well as laterally.  DTRs are normal.  Skin appears normal. Fingerstick blood sugar fasting at 116 and hemoglobin A1c is 5.6      Assessment & Plan:  Rotator cuff tendinitis, left  Screening for diabetes mellitus  Burning sensation I explained that he definitely does not have diabetes in the testing probably was chemical error. I then discussed the shoulder pain and recommend 2 Aleve twice per day as well as heat and gentle stretching.  Discussed the possibility of having him return here for possible injection and then physical therapy. At this point the left calf is nondiagnostic but recommend supportive care.  He was comfortable with that.

## 2023-07-21 ENCOUNTER — Ambulatory Visit

## 2023-07-21 ENCOUNTER — Telehealth: Payer: Self-pay

## 2023-07-21 DIAGNOSIS — L501 Idiopathic urticaria: Secondary | ICD-10-CM

## 2023-07-21 NOTE — Telephone Encounter (Signed)
 Patient came in to get his Xolair  and informed [place] that he is suppose to go to every 6 weeks since his hives are controlled. Per Anne's note at patient last visit it stated He is asking what length of time he will need to be on Xolair  for hive control. We did discuss the possibility of increasing the interval of injections from 4 weeks to 6 weeks when he feels as though his hives are well-controlled. Is it okay to start the every 6 weeks, please advise?

## 2023-07-21 NOTE — Telephone Encounter (Signed)
 He can move into Xolair  injections once every 6 weeks. Thank you.

## 2023-07-25 NOTE — Telephone Encounter (Signed)
 Spoke with patient, informed him of Anne's note. Patient has been reschedule for every 6 weeks instead of every 4.

## 2023-08-15 ENCOUNTER — Other Ambulatory Visit: Payer: Self-pay | Admitting: Allergy

## 2023-08-18 ENCOUNTER — Ambulatory Visit

## 2023-08-19 ENCOUNTER — Encounter: Payer: Self-pay | Admitting: Cardiology

## 2023-08-19 ENCOUNTER — Ambulatory Visit

## 2023-08-19 ENCOUNTER — Ambulatory Visit: Attending: Cardiology | Admitting: Cardiology

## 2023-08-19 VITALS — BP 126/85 | HR 70 | Resp 16 | Ht 67.0 in | Wt 154.8 lb

## 2023-08-19 DIAGNOSIS — I10 Essential (primary) hypertension: Secondary | ICD-10-CM | POA: Diagnosis not present

## 2023-08-19 DIAGNOSIS — I5032 Chronic diastolic (congestive) heart failure: Secondary | ICD-10-CM

## 2023-08-19 DIAGNOSIS — E782 Mixed hyperlipidemia: Secondary | ICD-10-CM | POA: Diagnosis not present

## 2023-08-19 MED ORDER — SACUBITRIL-VALSARTAN 97-103 MG PO TABS: 1.0000 | ORAL_TABLET | Freq: Two times a day (BID) | ORAL | 3 refills | Status: AC

## 2023-08-19 MED ORDER — DAPAGLIFLOZIN PROPANEDIOL 10 MG PO TABS: 10.0000 mg | ORAL_TABLET | Freq: Every day | ORAL | 3 refills | Status: AC

## 2023-08-19 NOTE — Progress Notes (Signed)
 Cardiology Office Note:  .   Date:  08/19/2023  ID:  Willis Adrian Legions, DOB 1955/03/21, MRN [id number] PCP:  Bulah Alm Willis Adrian  Former Cardiology Providers: NA [facility] Providers Cardiologist:  Adrian Large, DO , [facility] (established care 06/20/2019) Electrophysiologist:  None  Click to update primary MD,subspecialty MD or APP then REFRESH:1}    Chief Complaint  Patient presents with   Follow-up    Heart failure management    History of Present Illness: .   Adrian Willis is a 68 y.o.  male whose past medical history and cardiovascular risk factors includes: history of stroke 2011, hypertension, hyperlipidemia, and hx of HFrEF and now HFimpEF, stage B, NYHA class II, advanced age.  Patient being followed by the practice for HFimpEF.  Echocardiogram in the past and noted moderately reduced LVEF and ischemic workup illustrated a small sized, mild intensity, perfusion defect involving the RCA distribution.  However, invasive angiography or coronary CTA was not undertaken as per patient's wishes since he was asymptomatic and overall functional capacity is preserved.   Since then his medical therapy has been uptitrated and he has not had any anginal chest pain and LVEF has improved to 35-40% back to 50 to 55%.  He presents today for 1 year follow-up visit.  Since last office visit patient denies any anginal chest pain or heart failure symptoms.  No hospitalizations or urgent care visits for cardiovascular reasons.  He has been compliant with his medical therapy.  No significant weight gain.  Physical endurance remains stable.  Review of Systems: .   Review of Systems  Cardiovascular:  Negative for chest pain, claudication, irregular heartbeat, leg swelling, near-syncope, orthopnea, palpitations, paroxysmal nocturnal dyspnea and syncope.  Respiratory:  Negative for shortness of breath.   Hematologic/Lymphatic: Negative for bleeding problem.    Studies Reviewed:    EKG: EKG Interpretation Date/Time:  Friday August 19 2023 13:39:38 EDT Ventricular Rate:  67 PR Interval:  182 QRS Duration:  84 QT Interval:  356 QTC Calculation: 376 R Axis:   52  Text Interpretation: Normal sinus rhythm Normal ECG No previous ECGs available Confirmed by Willis Adrian [phone]) on 08/19/2023 1:54:45 PM  Echocardiogram: 06/25/2022:  Normal LV systolic function with visual EF 50-55%. Left ventricle cavity is normal in size. Normal left ventricular wall thickness. Normal global wall motion. Normal diastolic filling pattern, normal LAP.  Mild to moderate tricuspid regurgitation. No evidence of pulmonary hypertension.  Mild pulmonic regurgitation.  Compared to 01/05/2021: LVEF improved from 35-40% to 50-55%, diastolic dysfunction has improved, mild PR is new, otherwise no significant change.   Stress Testing: Lexiscan Sestamibi Stress Test 08/15/2019:  Low risk study.  Cardiac MRI: 06/17/2022 1.  Normal LV size with mild diffuse hypokinesis, EF 49%.   2.  Normal RV size with mild hypokinesis, EF 47%.   3. Visually no significant MR though calculated regurgitant fraction suggests moderate MR (24%). Would confirm via echo.   4. No myocardial LGE, so no definitive evidence for prior MI,infiltrative disease, or myocarditis.   5.  Normal T1 and extracellular volume percentage.  Cardiac monitor: Event monitor 06/27/2019 - 07/11/2019: Diagnostic time: 100%  Dominant rhythm: Normal Sinus. HR 51-154 bpm. Avg HR 80 bpm. No atrial fibrillation, high grade AV block, or sinus pause >3sec noted. Heart rate >100bpm for approximately 9% of the monitoring period.  Heart rate <60bpm for <1% of the monitoring period.  Total ventricular ectopy <1%.  No patient triggered events recorded.   RADIOLOGY:  NA  Risk Assessment/Calculations:   NA   Labs:       Latest Ref Rng & Units 01/14/2023   11:02 AM 06/16/2022    3:22 PM 01/06/2022   10:55 AM  CBC  WBC 3.4 - 10.8  x10E3/uL 3.9  4.5  4.1   Hemoglobin 13.0 - 17.7 g/dL 83.9  84.6  84.2   Hematocrit 37.5 - 51.0 % 52.2  48.5  49.1   Platelets 150 - 450 x10E3/uL 215  200  184        Latest Ref Rng & Units 01/14/2023   11:02 AM 08/06/2022   10:56 AM 06/16/2022    3:22 PM  BMP  Glucose 70 - 99 mg/dL 76  84  68   BUN 8 - 27 mg/dL 11  8  10    Creatinine 0.76 - 1.27 mg/dL 8.88  8.79  9.03   BUN/Creat Ratio 10 - 24 10  7  10    Sodium 134 - 144 mmol/L 144  142  140   Potassium 3.5 - 5.2 mmol/L 4.8  4.9  4.9   Chloride 96 - 106 mmol/L 101  101  102   CO2 20 - 29 mmol/L 23  27  24    Calcium  8.6 - 10.2 mg/dL 9.8  9.2  9.4       Latest Ref Rng & Units 01/14/2023   11:02 AM 08/06/2022   10:56 AM 06/16/2022    3:22 PM  CMP  Glucose 70 - 99 mg/dL 76  84  68   BUN 8 - 27 mg/dL 11  8  10    Creatinine 0.76 - 1.27 mg/dL 8.88  8.79  9.03   Sodium 134 - 144 mmol/L 144  142  140   Potassium 3.5 - 5.2 mmol/L 4.8  4.9  4.9   Chloride 96 - 106 mmol/L 101  101  102   CO2 20 - 29 mmol/L 23  27  24    Calcium  8.6 - 10.2 mg/dL 9.8  9.2  9.4   Total Protein 6.0 - 8.5 g/dL 7.4     Total Bilirubin 0.0 - 1.2 mg/dL 0.5     Alkaline Phos 44 - 121 IU/L 70     AST 0 - 40 IU/L 25     ALT 0 - 44 IU/L 23       Lab Results  Component Value Date   CHOL 157 01/14/2023   HDL 54 01/14/2023   LDLCALC 83 01/14/2023   TRIG 109 01/14/2023   CHOLHDL 2.9 01/14/2023   No results for input(s): LIPOA in the last 8760 hours. No components found for: NTPROBNP No results for input(s): PROBNP in the last 8760 hours. Recent Labs    02/04/23 1044  TSH 0.459    Physical Exam:    Today's Vitals   08/19/23 1335  BP: 126/85  Pulse: 70  Resp: 16  SpO2: 97%  Weight: 154 lb 12.8 oz (70.2 kg)  Height: 5' 7 (1.702 m)   Body mass index is 24.25 kg/m. Wt Readings from Last 3 Encounters:  08/19/23 154 lb 12.8 oz (70.2 kg)  07/07/23 156 lb (70.8 kg)  05/12/23 157 lb 12.8 oz (71.6 kg)    Physical Exam  Constitutional: No  distress.  hemodynamically stable  Neck: No JVD present.  Cardiovascular: Normal rate, regular rhythm, S1 normal and S2 normal. Exam reveals no gallop, no S3 and no S4.  No murmur heard. Pulmonary/Chest: Effort normal and breath sounds normal. No stridor.  He has no wheezes. He has no rales.  Musculoskeletal:        General: No edema.     Cervical back: Neck supple.  Skin: Skin is warm.     Impression & Recommendation(s):  Impression:   ICD-10-CM   1. Heart failure with improved ejection fraction (HFimpEF) (HCC)  I50.32 EKG 12-Lead    dapagliflozin  propanediol (FARXIGA ) 10 MG TABS tablet    sacubitril -valsartan  (ENTRESTO ) 97-103 MG    2. Essential hypertension  I10     3. Mixed hyperlipidemia  E78.2        Recommendation(s):  Heart failure with improved ejection fraction (HFimpEF) (HCC) Stage B, NYHA class I/II Echo December 2022: 35 to 40%. Echo June 2024: LVEF 50 to 55% Cardiac MRI May 2024: LVEF 49% Continue Entresto  97/103 mg p.o. twice daily. Continue Farxiga  10 mg p.o. daily. Will refill Entresto  and Farxiga . Relatively stable from a cardiovascular standpoint recommend annual follow-up.  Essential hypertension Office blood pressures are well-controlled Medications as discussed above. Reemphasized importance of low-salt diet.  Mixed hyperlipidemia Continue Crestor  40 mg p.o. daily   Orders Placed:  Orders Placed This Encounter  Procedures   EKG 12-Lead     Final Medication List:    Meds ordered this encounter  Medications   dapagliflozin  propanediol (FARXIGA ) 10 MG TABS tablet    Sig: Take 1 tablet (10 mg total) by mouth daily before breakfast.    Dispense:  90 tablet    Refill:  3    DX Code Needed  .   sacubitril -valsartan  (ENTRESTO ) 97-103 MG    Sig: Take 1 tablet by mouth 2 (two) times daily.    Dispense:  180 tablet    Refill:  3    Medications Discontinued During This Encounter  Medication Reason   ascorbic acid (VITAMIN C) 1000 MG tablet  Patient Preference   famotidine  (PEPCID ) 20 MG tablet Patient Preference   fexofenadine  (ALLEGRA  ALLERGY) 180 MG tablet Patient Preference   hydrocortisone cream 1 % Patient Preference   Vitamin D , Ergocalciferol , (DRISDOL ) 1.25 MG (50000 UNIT) CAPS capsule Patient Preference   sacubitril -valsartan  (ENTRESTO ) 97-103 MG Reorder   dapagliflozin  propanediol (FARXIGA ) 10 MG TABS tablet Reorder     Current Outpatient Medications:    aspirin  EC (ASPIRIN  LOW DOSE) 81 MG tablet, Take 1 tablet (81 mg total) by mouth 5 (five) times daily. SWALLOW WHOLE., Disp: 90 tablet, Rfl: 3   EPINEPHrine  0.3 mg/0.3 mL IJ SOAJ injection, Inject 0.3 mg into the muscle as needed for anaphylaxis., Disp: 2 each, Rfl: 1   montelukast  (SINGULAIR ) 10 MG tablet, TAKE 1 TABLET (10 MG TOTAL) BY MOUTH AT BEDTIME. FOR HIVE CONTROL, Disp: 90 tablet, Rfl: 1   nitroGLYCERIN  (NITROSTAT ) 0.4 MG SL tablet, Place 1 tablet (0.4 mg total) under the tongue every 5 (five) minutes as needed for chest pain. If you require more than two tablets five minutes apart go to the nearest ER via EMS., Disp: 30 tablet, Rfl: 0   rosuvastatin  (CRESTOR ) 40 MG tablet, TAKE 1 TABLET BY MOUTH EVERY DAY, Disp: 90 tablet, Rfl: 2   dapagliflozin  propanediol (FARXIGA ) 10 MG TABS tablet, Take 1 tablet (10 mg total) by mouth daily before breakfast., Disp: 90 tablet, Rfl: 3   sacubitril -valsartan  (ENTRESTO ) 97-103 MG, Take 1 tablet by mouth 2 (two) times daily., Disp: 180 tablet, Rfl: 3  Current Facility-Administered Medications:    omalizumab  (XOLAIR ) prefilled syringe 300 mg, 300 mg, Subcutaneous, Q28 days, Luke Needle M, DO, 300 mg at  07/21/23 1105  Consent:   NA  Disposition:   1 year follow-up sooner if needed  His questions and concerns were addressed to his satisfaction. He voices understanding of the recommendations provided during this encounter.    Signed, Adrian Michele HAS, [facility]  A Division of [facility] [address] [place], [place] [address]  [place], [place] [address] 08/19/2023 5:06 PM

## 2023-08-19 NOTE — Patient Instructions (Signed)
 Medication Instructions:  Refill for Entresto  and Farxiga  has been sent to pharmacy.   *If you need a refill on your cardiac medications before your next appointment, please call your pharmacy*  Lab Work: None ordered today. If you have labs (blood work) drawn today and your tests are completely normal, you will receive your results only by: MyChart Message (if you have MyChart) OR A paper copy in the mail If you have any lab test that is abnormal or we need to change your treatment, we will call you to review the results.  Testing/Procedures: None ordered today.  Follow-Up: At [facility], you and your health needs are our priority.  As part of our continuing mission to provide you with exceptional heart care, our providers are all part of one team.  This team includes your primary Cardiologist (physician) and Advanced Practice Providers or APPs (Physician Assistants and Nurse Practitioners) who all work together to provide you with the care you need, when you need it.  Your next appointment:   1 year(s)  Provider:   Madonna Large, DO

## 2023-09-02 ENCOUNTER — Ambulatory Visit (INDEPENDENT_AMBULATORY_CARE_PROVIDER_SITE_OTHER): Admitting: *Deleted

## 2023-09-02 DIAGNOSIS — L501 Idiopathic urticaria: Secondary | ICD-10-CM

## 2023-09-29 ENCOUNTER — Ambulatory Visit: Payer: Self-pay

## 2023-09-29 NOTE — Telephone Encounter (Signed)
 FYI Only or Action Required?: FYI only for provider.  Patient was last seen in primary care on 07/07/2023 by Joyce Norleen BROCKS, MD.  Called Nurse Triage reporting Back Pain.  Symptoms began 2 days ago.  Interventions attempted: OTC medications: Aleve and Ice/heat application.  Symptoms are: unchanged.  Triage Disposition: See PCP When Office is Open (Within 3 Days)  Patient/caregiver understands and will follow disposition?: Yes  Appointment scheduled for 9/15, patient states that's the earliest he can come due to work.      Copied from CRM #[id number]. Topic: Clinical - Red Word Triage >> Sep 29, 2023 12:38 PM Graeme ORN wrote: Red Word that prompted transfer to Nurse Triage: Lower back pain. Preventing movement.       Reason for Disposition  [1] Pain radiates into the thigh or further down the leg AND [2] one leg  Answer Assessment - Initial Assessment Questions 1. ONSET: When did the pain begin? (e.g., minutes, hours, days)     2 days ago  2. LOCATION: Where does it hurt? (upper, mid or lower back)     Lower back  3. SEVERITY: How bad is the pain?  (e.g., Scale 1-10; mild, moderate, or severe)      Moderate to severe  4. PATTERN: Is the pain constant? (e.g., yes, no; constant, intermittent)       Constant  5. RADIATION: Does the pain shoot into your legs or somewhere else?     Radiates to right leg  6. CAUSE:  What do you think is causing the back pain?      Unsure  7. BACK OVERUSE:  Any recent lifting of heavy objects, strenuous work or exercise?     No 8. MEDICINES: What have you taken so far for the pain? (e.g., nothing, acetaminophen , NSAIDS)     Aleve and applied heat  9. NEUROLOGIC SYMPTOMS: Do you have any weakness, numbness, or problems with bowel/bladder control?     No 10. OTHER SYMPTOMS: Do you have any other symptoms? (e.g., fever, abdomen pain, burning with urination, blood in urine)       No  Protocols used: Back Pain-A-AH

## 2023-10-03 ENCOUNTER — Ambulatory Visit: Admitting: Medical

## 2023-10-03 VITALS — BP 110/72 | HR 78 | Temp 98.6°F | Wt 156.6 lb

## 2023-10-03 DIAGNOSIS — M79604 Pain in right leg: Secondary | ICD-10-CM | POA: Diagnosis not present

## 2023-10-03 DIAGNOSIS — M5441 Lumbago with sciatica, right side: Secondary | ICD-10-CM

## 2023-10-03 MED ORDER — TIZANIDINE HCL 4 MG PO TABS: 4.0000 mg | ORAL_TABLET | Freq: Two times a day (BID) | ORAL | 0 refills | Status: DC | PRN

## 2023-10-03 MED ORDER — IBUPROFEN 600 MG PO TABS: 600.0000 mg | ORAL_TABLET | Freq: Three times a day (TID) | ORAL | 0 refills | Status: AC | PRN

## 2023-10-03 NOTE — Patient Instructions (Signed)
 Low back pain with right-sided sciatica symptoms and possible sacroiliitis  Pain likely due to muscle strain and possible nerve involvement. Differential includes sciatica and sacroiliitis. Normal strength and reflexes. Previous medications provided relief.  - Prescribed ibuprofen  600 mg for pain and inflammation, to be taken two or three times daily as needed. - Prescribed a muscle relaxer for home use, cautioning about drowsiness.  You can use the muscle relaxer 1-2 times if needed over the next few days. - Recommended stretching exercises: toe touches, leg crosses, other stretches demonstrated. - Advised using a pillow under legs while sleeping to alleviate back pressure. - Suggested alternating heat and ice application to affected area. - Consider referral to massage therapy or chiropractic care if symptoms persist or recur. - Discussed potential need for x-rays if symptoms do not improve or recur frequently.

## 2023-10-03 NOTE — Progress Notes (Signed)
 Name: Levar Fayson Wiedeman   Date of Visit: 10/03/23   Date of last visit with me: 04/18/2023   CHIEF COMPLAINT:  Chief Complaint  Patient presents with   Acute Visit    Back pain x 2 weeks and will go down right knee. Was mopping and doing leg lifts and not sure if that had to do anything with him       HPI:  Discussed the use of AI scribe software for clinical note transcription with the patient, who gave verbal consent to proceed.  History of Present Illness  LATRELL POTEMPA is a 68 year old male who presents with back pain radiating to the right leg.  He has been experiencing back pain radiating down his right leg to the knee, primarily affecting the thigh. The pain is exacerbated when he tries to sleep on his side, causing a 'funny sensation' that requires him to change positions quickly. No pain in the calf, numbness, tingling, or buttock pain, but he does report weakness in the right leg.  The onset of symptoms occurred after mopping a large lobby area and performing leg lifts while brushing his teeth, although he did not feel any immediate discomfort. The pain began the following day while walking to work and has persisted for about a week.  He has taken a muscle relaxer and a brown tablet for pain, which provided relief. He decided not to take any pain medication last night to assess the pain's natural progression and realized the medication was helping as the pain persisted without it.  He has attempted stretching exercises when the back becomes stiff, which provides some relief. No recent falls or injuries.  During the review of symptoms, he confirmed that the pain does not extend below the knee, and he does not experience numbness, tingling, or pain in the back of the leg. He reports feeling the pain primarily in the back and right thigh, especially when sitting for extended periods.  No other aggravating or relieving factors. No other complaint.   Past Medical  History:  Diagnosis Date   BPH (benign prostatic hyperplasia)    CHF (congestive heart failure) (HCC)    CVA (cerebral vascular accident) (HCC) 2011   numbness right index finger and right thumb and right thigh   GERD (gastroesophageal reflux disease)    Hyperlipidemia    Hypertension    Current Outpatient Medications on File Prior to Visit  Medication Sig Dispense Refill   aspirin  EC (ASPIRIN  LOW DOSE) 81 MG tablet Take 1 tablet (81 mg total) by mouth 5 (five) times daily. SWALLOW WHOLE. 90 tablet 3   dapagliflozin  propanediol (FARXIGA ) 10 MG TABS tablet Take 1 tablet (10 mg total) by mouth daily before breakfast. 90 tablet 3   montelukast  (SINGULAIR ) 10 MG tablet TAKE 1 TABLET (10 MG TOTAL) BY MOUTH AT BEDTIME. FOR HIVE CONTROL 90 tablet 1   nitroGLYCERIN  (NITROSTAT ) 0.4 MG SL tablet Place 1 tablet (0.4 mg total) under the tongue every 5 (five) minutes as needed for chest pain. If you require more than two tablets five minutes apart go to the nearest ER via EMS. 30 tablet 0   rosuvastatin  (CRESTOR ) 40 MG tablet TAKE 1 TABLET BY MOUTH EVERY DAY 90 tablet 2   sacubitril -valsartan  (ENTRESTO ) 97-103 MG Take 1 tablet by mouth 2 (two) times daily. 180 tablet 3   EPINEPHrine  0.3 mg/0.3 mL IJ SOAJ injection Inject 0.3 mg into the muscle as needed for anaphylaxis. 2 each 1  Current Facility-Administered Medications on File Prior to Visit  Medication Dose Route Frequency Provider Last Rate Last Admin   omalizumab  (XOLAIR ) prefilled syringe 300 mg  300 mg Subcutaneous Q28 days Luke Orlan HERO, DO   300 mg at 09/02/23 1108      Objective: BP 110/72   Pulse 78   Temp 98.6 F (37 C)   Wt 156 lb 9.6 oz (71 kg)   BMI 24.53 kg/m   Gen: wd, wn ,nad Tender over the right SI joint mildly, tender somewhat over the right buttock sciatic notch otherwise back nontender, back flexion only to about 90 degrees with some stiffness, extension relatively normal Legs nontender normal range of motion Lower  extremity strength and sensation DTRs normal No leg edema    Assessment: Encounter Diagnoses  Name Primary?   Acute right-sided low back pain with right-sided sciatica Yes   Leg pain, right      Plan:  Low back pain with right-sided sciatica symptoms and possible sacroiliitis  Pain likely due to muscle strain and possible nerve involvement. Differential includes sciatica and sacroiliitis. Normal strength and reflexes. Previous medications provided relief.  - Prescribed ibuprofen  600 mg for pain and inflammation, to be taken two or three times daily as needed. - Prescribed a muscle relaxer for home use, cautioning about drowsiness.  You can use the muscle relaxer 1-2 times if needed over the next few days. - Recommended stretching exercises: toe touches, leg crosses, other stretches demonstrated. - Advised using a pillow under legs while sleeping to alleviate back pressure. - Suggested alternating heat and ice application to affected area. - Consider referral to massage therapy or chiropractic care if symptoms persist or recur. - Discussed potential need for x-rays if symptoms do not improve or recur frequently.  Azell was seen today for acute visit.  Diagnoses and all orders for this visit:  Acute right-sided low back pain with right-sided sciatica  Leg pain, right  Other orders -     ibuprofen  (ADVIL ) 600 MG tablet; Take 1 tablet (600 mg total) by mouth every 8 (eight) hours as needed. -     tiZANidine  (ZANAFLEX ) 4 MG tablet; Take 1 tablet (4 mg total) by mouth 2 (two) times daily as needed for muscle spasms.   F/u prn

## 2023-10-13 ENCOUNTER — Ambulatory Visit (INDEPENDENT_AMBULATORY_CARE_PROVIDER_SITE_OTHER)

## 2023-10-13 DIAGNOSIS — L501 Idiopathic urticaria: Secondary | ICD-10-CM | POA: Diagnosis not present

## 2023-10-14 ENCOUNTER — Ambulatory Visit

## 2023-10-17 ENCOUNTER — Ambulatory Visit: Payer: Self-pay | Admitting: *Deleted

## 2023-10-17 NOTE — Telephone Encounter (Signed)
 Unable to get in touch with patient. Pt will need a visit.

## 2023-10-17 NOTE — Telephone Encounter (Signed)
 This RN made the 3rd attempt to contact patient. No answer, left a voicemail with call back number. Will route to office for follow up.

## 2023-10-17 NOTE — Telephone Encounter (Signed)
 Copied from CRM #[id number]. Topic: Clinical - Red Word Triage >> Oct 17, 2023  9:10 AM Adrian Willis wrote: Red Word that prompted transfer to Nurse Triage: Pain in back & knee still.. Hard to walk / hard to lay down and sometimes can sit.    Patient has disconnected from call with agent Attempted to reach out to patient/wife- no answer- left call back message

## 2023-11-24 ENCOUNTER — Encounter: Payer: Medicare Other | Admitting: Medical

## 2023-11-27 NOTE — Progress Notes (Signed)
 " [address] [place] [address] Dept: [phone]  FOLLOW UP NOTE  Patient ID: Adrian Willis, male    DOB: [birth year]  Age: 68 y.o. MRN: [id number] Date of Office Visit: 11/28/2023  Assessment  Chief Complaint: Urticaria (About a week ago, started to have flare. Still currently active. Left side of jaw swollen up. When it is close to xolair  being due, does notice them coming back. ) and Follow-up  HPI Adrian Willis is a 68 year old male who presents to the clinic for a follow-up visit.  He was last seen in this clinic on 05/12/2023 by Arlean Mutter, FNP, for evaluation of urticaria on Xolair  once every 6 weeks.  Discussed the use of AI scribe software for clinical note transcription with the patient, who gave verbal consent to proceed.  History of Present Illness Adrian Willis is a 68 year old male with chronic hives who presents with worsening symptoms.  Over the last several weeks, while traveling, he began to experience an episode of hives.  He reports these hives appeared to look differently and may have had an indented nature as well as raised. Symptoms occur sporadically, with episodes of pruritus on his nose and other areas, including the bottom of his feet.  He has been using an over-the-counter allergy tablet and a cream borrowed from his wife who also suffers from allergies. The cream has been helpful in managing his symptoms.  He is unsure of the name of this cream and will call the clinic upon returning home.  He is currently receiving Xolair  injections every six weeks, which generally control his symptoms, except for the most recent cycle. Symptoms became more aggressive while traveling for work, particularly affecting the bottom of his feet and his body. He has been taking an antihistamine every other night for the past two weeks, whereas previously he only needed it sporadically, about once a week.  He denies concomitant cardiopulmonary or gastrointestinal  symptoms with these hives.  His work involves frequent travel, which he finds stressful, and he suspects that this stress may exacerbate his hives. He recently experienced a stressful incident involving a corporate credit card scam, which he believes contributed to his recent flare-up. He reports losing sleep over this incident. No recent illness or viral symptoms, although his nose tends to run when exposed to cold weather.  He is interested in continuing Xolair  injections once every 6 weeks while adjusting to frequent traveling.  He reports that should his hives become more problematic, he will call the clinic and we will consider moving Xolair  injections back to once every 4 weeks.  Epinephrine  autoinjector set is up-to-date.  His current medications are listed in the chart.   Drug Allergies:  No Known Allergies  Physical Exam: BP 100/80   Pulse 66   Temp (!) 97 F (36.1 C)   Resp 18   SpO2 100%    Physical Exam Vitals reviewed.  Constitutional:      Appearance: Normal appearance.  HENT:     Head: Normocephalic and atraumatic.     Right Ear: Tympanic membrane normal.     Left Ear: Tympanic membrane normal.     Nose:     Comments: Bilateral naris normal.  Pharynx normal.  Ears normal.  Eyes normal.    Mouth/Throat:     Pharynx: Oropharynx is clear.  Eyes:     Conjunctiva/sclera: Conjunctivae normal.  Cardiovascular:     Rate and Rhythm: Normal rate and regular rhythm.  Heart sounds: Normal heart sounds. No murmur heard. Pulmonary:     Effort: Pulmonary effort is normal.     Breath sounds: Normal breath sounds.     Comments: Lungs clear to auscultation Musculoskeletal:        General: Normal range of motion.     Cervical back: Normal range of motion and neck supple.  Skin:    General: Skin is warm and dry.  Neurological:     Mental Status: He is alert and oriented to person, place, and time.  Psychiatric:        Mood and Affect: Mood normal.        Behavior:  Behavior normal.        Thought Content: Thought content normal.        Judgment: Judgment normal.     Assessment and Plan: 1. Chronic urticaria     Patient Instructions  Chronic urticaria/angioedema Take the least amount of medications while remaining hive free Fexofenadine  (Allegra ) 180 mg twice a day and famotidine  (Pepcid ) 20 mg twice a day. If no symptoms for 7-14 days then decrease to Fexofenadine  (Allegra ) 180 mg twice a day and famotidine  (Pepcid ) 20 mg once a day.  If no symptoms for 7-14 days then decrease to Fexofenadine  (Allegra ) 180 mg twice a day.  If no symptoms for 7-14 days then decrease to Fexofenadine  (Allegra ) 180 mg once a day.   Continue Xolair  300 mg injections once every 6 weeks and have access to an epinephrine  autoinjector set per protocol  Call the clinic if this treatment plan is not working well for you.  Follow up in 6 months or sooner if needed.  Return in about 6 months (around 05/27/2024), or if symptoms worsen or fail to improve.    Thank you for the opportunity to care for this patient.  Please do not hesitate to contact me with questions.  Arlean Mutter, FNP [facility]       "

## 2023-11-27 NOTE — Patient Instructions (Signed)
 Chronic urticaria/angioedema Take the least amount of medications while remaining hive free Fexofenadine  (Allegra ) 180 mg twice a day and famotidine  (Pepcid ) 20 mg twice a day. If no symptoms for 7-14 days then decrease to. Fexofenadine  (Allegra ) 180 mg twice a day and famotidine  (Pepcid ) 20 mg once a day.  If no symptoms for 7-14 days then decrease to. Fexofenadine  (Allegra ) 180 mg twice a day.  If no symptoms for 7-14 days then decrease to. Fexofenadine  (Allegra ) 180 mg once a day.   Continue Xolair  300 mg injections once every 6 weeks and have access to an epinephrine  autoinjector set per protocol  Call the clinic if this treatment plan is not working well for you.  Follow up in 6 months or sooner if needed.

## 2023-11-28 ENCOUNTER — Ambulatory Visit (INDEPENDENT_AMBULATORY_CARE_PROVIDER_SITE_OTHER): Admitting: Family Medicine

## 2023-11-28 ENCOUNTER — Encounter: Payer: Self-pay | Admitting: Family Medicine

## 2023-11-28 ENCOUNTER — Other Ambulatory Visit: Payer: Self-pay

## 2023-11-28 ENCOUNTER — Ambulatory Visit

## 2023-11-28 VITALS — BP 100/80 | HR 66 | Temp 97.0°F | Resp 18

## 2023-11-28 DIAGNOSIS — L508 Other urticaria: Secondary | ICD-10-CM | POA: Diagnosis not present

## 2023-11-28 DIAGNOSIS — L501 Idiopathic urticaria: Secondary | ICD-10-CM | POA: Diagnosis not present

## 2023-12-28 ENCOUNTER — Other Ambulatory Visit (HOSPITAL_COMMUNITY): Payer: Self-pay

## 2023-12-28 ENCOUNTER — Telehealth: Payer: Self-pay

## 2023-12-28 NOTE — Telephone Encounter (Signed)
 Pharmacy Patient Advocate Encounter  Received notification from [facility] that Prior Authorization for ENTRESTO  has been DENIED.  Full denial letter will be uploaded to the media tab. See denial reason below.  PLAN REQUIRES PT TO USE BETA BLOCKER WITH ENTRESTO 

## 2023-12-28 NOTE — Telephone Encounter (Signed)
 Pharmacy Patient Advocate Encounter   Received notification from CoverMyMeds that prior authorization for ENTRESTO  is required/requested.   Insurance verification completed.   The patient is insured through [facility]   Per test claim: PA required; PA submitted to above mentioned insurance via Latent Key/confirmation #/EOC [id number] Status is pending

## 2023-12-29 NOTE — Telephone Encounter (Signed)
 He was on Toprol -XL 12.5 mg p.o. daily with holding parameters.  It could have just dropped off.  May restart and resubmit Entresto . Please update patient as well.   Dr. Trask Vosler

## 2023-12-30 ENCOUNTER — Other Ambulatory Visit (HOSPITAL_COMMUNITY): Payer: Self-pay

## 2023-12-30 NOTE — Telephone Encounter (Signed)
 Pharmacy Patient Advocate Encounter   Received notification from Physician's Office that prior authorization for ENTRESTO  is required/requested.   Insurance verification completed.   The patient is insured through [facility].   Per test claim: PA required; PA submitted to above mentioned insurance via Latent Key/confirmation #/EOC [id number] Status is pending

## 2023-12-30 NOTE — Telephone Encounter (Signed)
 Please resubmit Entresto  for brand or generic.

## 2024-01-05 NOTE — Telephone Encounter (Signed)
 Entresto  PA has been approved.

## 2024-01-09 NOTE — Telephone Encounter (Signed)
Thank-you.   Dr. Odis Hollingshead

## 2024-01-11 ENCOUNTER — Other Ambulatory Visit: Payer: Self-pay | Admitting: Medical

## 2024-01-16 ENCOUNTER — Ambulatory Visit: Admitting: Medical

## 2024-01-16 VITALS — BP 106/62 | HR 62 | Ht 68.5 in | Wt 158.8 lb

## 2024-01-16 DIAGNOSIS — H5711 Ocular pain, right eye: Secondary | ICD-10-CM

## 2024-01-16 DIAGNOSIS — Z7185 Encounter for immunization safety counseling: Secondary | ICD-10-CM

## 2024-01-16 DIAGNOSIS — Z1389 Encounter for screening for other disorder: Secondary | ICD-10-CM

## 2024-01-16 DIAGNOSIS — M7989 Other specified soft tissue disorders: Secondary | ICD-10-CM | POA: Diagnosis not present

## 2024-01-16 DIAGNOSIS — Z23 Encounter for immunization: Secondary | ICD-10-CM | POA: Diagnosis not present

## 2024-01-16 DIAGNOSIS — L509 Urticaria, unspecified: Secondary | ICD-10-CM | POA: Diagnosis not present

## 2024-01-16 DIAGNOSIS — Z8673 Personal history of transient ischemic attack (TIA), and cerebral infarction without residual deficits: Secondary | ICD-10-CM | POA: Diagnosis not present

## 2024-01-16 DIAGNOSIS — I5020 Unspecified systolic (congestive) heart failure: Secondary | ICD-10-CM

## 2024-01-16 DIAGNOSIS — Z0000 Encounter for general adult medical examination without abnormal findings: Secondary | ICD-10-CM | POA: Diagnosis not present

## 2024-01-16 DIAGNOSIS — Z125 Encounter for screening for malignant neoplasm of prostate: Secondary | ICD-10-CM

## 2024-01-16 DIAGNOSIS — H539 Unspecified visual disturbance: Secondary | ICD-10-CM | POA: Diagnosis not present

## 2024-01-16 MED ORDER — ASPIRIN 81 MG PO TBEC: 81.0000 mg | DELAYED_RELEASE_TABLET | Freq: Every day | ORAL | 3 refills | Status: AC

## 2024-01-16 MED ORDER — HYDROCORTISONE 1 % EX CREA: 1.0000 | TOPICAL_CREAM | Freq: Two times a day (BID) | CUTANEOUS | 0 refills | Status: AC

## 2024-01-16 NOTE — Progress Notes (Signed)
 "   Name: Adrian Willis   Date of Visit: 01/16/2024   Date of last visit with me: 01/11/2024   CHIEF COMPLAINT:  Chief Complaint  Patient presents with   Annual Exam    Fasting cpe, still having issues with Hives for the past year- swelling, hives- mouth, lips,whole body, palms swelling.  Needs refills on pended medications as he will be traveling and needs refill       HPI:  Discussed the use of AI scribe software for clinical note transcription with the patient, who gave verbal consent to proceed.  History of Present Illness   Patient Care Team: Loranzo Desha, Alm RAMAN, PA-C as PCP - General (Family Medicine) Michele Richardson, DO as PCP - Cardiology (Cardiology) Jeneal Danita Macintosh, MD as Consulting Physician (Allergy)  Adrian Willis is a 68 year old male who for well visit and presents with worsening allergy symptoms and vision changes.  He experiences significant allergy symptoms, including swelling in both hands, with the right hand being worse, and swelling in the throat at night, making it difficult to swallow. Symptoms also affect his arm and forehead, which have been particularly aggressive this month. Initially, his symptoms were well-controlled with allergy shots, but after the schedule was extended to every six weeks, his symptoms worsened. After his last shot, he felt perfect initially, but the following day he was in misery and unable to work.  He describes a sensation of 'grumbling' in his right eye, which feels like there is something in it, affecting his vision. He has not seen an eye doctor since moving to the area and reports losing vision in his right eye, with pain in the back of the eyeball. He uses eye pads provided by his daughter to massage his eyes, but the sensation persists. No itchy eyes, but he reports a sensation of dirt or something in his eye. He experiences pain in the back of his right eyeball.  He suspects that his hives symptoms may have  started after receiving a prior COVID-19 vaccine.  He recalls waking up in a hotel two days after the Pfizer shot with intense itching and hives, which have persisted for over a year. He also considers the possibility of having ingested a stink bug around the same time, which he believes could have triggered his symptoms.  He is currently taking aspirin  81 mg daily, Singulair , Entresto  twice a day, tizanidine  as needed, and nitroglycerin  as needed. He occasionally uses hydrocortisone cream for skin issues, which he finds helpful, and applies a daily moisturizing lotion. He is on a strict diet and exercises regularly, feeling mentally sharp despite his allergy issues.  Allergies[1]  Past Medical History:  Diagnosis Date   BPH (benign prostatic hyperplasia)    CHF (congestive heart failure) (HCC)    CVA (cerebral vascular accident) (HCC) 2011   numbness right index finger and right thumb and right thigh   GERD (gastroesophageal reflux disease)    Hyperlipidemia    Hypertension     Medications Ordered Prior to Encounter[2]   Current Medications[2]  Family History  Problem Relation Age of Onset   Hypertension Mother    Benign prostatic hyperplasia Father    Other Father        died after horrible bed sore and demise   Cancer Sister    Heart disease Neg Hx    Stroke Neg Hx     Past Surgical History:  Procedure Laterality Date   CARPAL TUNNEL RELEASE Right  CYSTOSCOPY WITH INSERTION OF UROLIFT     LASIK     thumb     right, nerve injury repair   TRANSURETHRAL RESECTION OF PROSTATE N/A 12/03/2019   Procedure: TRANSURETHRAL RESECTION OF THE PROSTATE (TURP), BIPOLAR;  Surgeon: Selma Donnice SAUNDERS, MD;  Location: [facility];  Service: Urology;  Laterality: N/A;  ONLY NEEDS 60 MIN    ROS as in subjective   Objective: BP 106/62   Pulse 62   Ht 5' 8.5 (1.74 m)   Wt 158 lb 12.8 oz (72 kg)   SpO2 96%   BMI 23.79 kg/m   General appearence: alert, no distress, WD/WN, African American  male HEENT: normocephalic, sclerae anicteric, PERRLA, EOMi, nares patent, no discharge or erythema, pharynx normal Oral cavity: MMM, no lesions, possible cataract left eye, othewrise normal appearing, visual fields normal. Neck: supple, no lymphadenopathy, no thyromegaly, no masses, no bruits Heart: RRR, normal S1, S2, no murmurs Lungs: CTA bilaterally, no wheezes, rhonchi, or rales Abdomen: +bs, soft, non tender, non distended, no masses, no hepatomegaly, no splenomegaly Back: non tender Musculoskeletal: nontender, no swelling, no obvious deformity Extremities: no edema, no cyanosis, no clubbing Pulses: 2+ symmetric, upper and lower extremities, normal cap refill Neurological: alert, oriented x 3, CN2-12 intact, strength normal upper extremities and lower extremities, sensation normal throughout, DTRs 2+ throughout, no cerebellar signs, gait normal Psychiatric: normal affect, behavior normal, pleasant  GU: Normal male, no mass, no testicular abnormality, no hernia or lymphadenopathy Rectal: Anus normal tone, prostate moderate enlargement, no nodule Skin unremarkable   Results Pathology Cologuard (12/2022): Negative     Assessment and Plan Encounter Diagnoses  Name Primary?   Encounter for health maintenance examination in adult Yes   Heart failure with improved ejection fraction (HFimpEF) (HCC)    Need for pneumococcal 20-valent conjugate vaccination    Vaccine counseling    Screening for hematuria or proteinuria    Vision changes    Eye pain, right    Screening for prostate cancer    Hand swelling    Hives    History of stroke    History of heart failure  -Reviewed his August 2025 office notes from cardiology, summary of their plan as below Stage B, NYHA class I/II Echo December 2022: 35 to 40%. Echo June 2024: LVEF 50 to 55% Cardiac MRI May 2024: LVEF 49% Continue Entresto  97/103 mg p.o. twice daily. Continue Farxiga  10 mg p.o. daily. He was also continued on  Crestor  40 mg daily  Chronic urticaria with hand swelling Chronic urticaria with nocturnal palm swelling, soreness, and heat. Allergen shots initially effective, now less so.  Hydrocortisone cream provides relief topicaly but risks skin thinning with daily use. - Use hydrocortisone cream sparingly, consider alternatives for regular use. -continue daily moisturizing lotion - Ordered routine blood work to assess overall health. - Continue f/u with allergist.  Vision changes with possible cataract and right eye pain Vision changes with possible left eye cataract and right eye pain. Symptoms include sensation of dirt in the eye and intermittent pain. No recent eye exam since relocation. - Referred to ophthalmology for comprehensive eye examination and cataract evaluation. - Encouraged regular eye examinations to monitor vision changes.  General Health Maintenance Routine health maintenance discussed. Tetanus and shingles vaccinations current. Cologuard screening negative last year. Cardiologist follow-up with Dr. Michele - Administered updated pneumonia vaccination. - Encouraged annual dental and eye examinations. - Continue routine health screenings as per guidelines.  Melquan was seen today for annual exam.  Diagnoses and all orders for this visit:  Encounter for health maintenance examination in adult -     Ambulatory referral to Ophthalmology -     CBC with Differential/Platelet -     Comprehensive metabolic panel with GFR -     Lipid panel -     PSA -     Urinalysis, Routine w reflex microscopic -     High sensitivity CRP -     ANA -     Sedimentation rate  Heart failure with improved ejection fraction (HFimpEF) (HCC)  Need for pneumococcal 20-valent conjugate vaccination  Vaccine counseling  Screening for hematuria or proteinuria -     Urinalysis, Routine w reflex microscopic  Vision changes  Eye pain, right  Screening for prostate cancer -     PSA  Hand  swelling -     High sensitivity CRP -     ANA -     Sedimentation rate  Hives -     High sensitivity CRP -     ANA -     Sedimentation rate  History of stroke -     aspirin  EC (ASPIRIN  LOW DOSE) 81 MG tablet; Take 1 tablet (81 mg total) by mouth 5 (five) times daily. SWALLOW WHOLE.    F/u pending labs      [1] No Known Allergies [2]  Current Outpatient Medications on File Prior to Visit  Medication Sig Dispense Refill   dapagliflozin  propanediol (FARXIGA ) 10 MG TABS tablet Take 1 tablet (10 mg total) by mouth daily before breakfast. 90 tablet 3   hydrocortisone 2.5 % cream Apply topically 2 (two) times daily.     ibuprofen  (ADVIL ) 600 MG tablet Take 1 tablet (600 mg total) by mouth every 8 (eight) hours as needed. 30 tablet 0   montelukast  (SINGULAIR ) 10 MG tablet TAKE 1 TABLET (10 MG TOTAL) BY MOUTH AT BEDTIME. FOR HIVE CONTROL 90 tablet 1   rosuvastatin  (CRESTOR ) 40 MG tablet TAKE 1 TABLET BY MOUTH EVERY DAY 90 tablet 0   sacubitril -valsartan  (ENTRESTO ) 97-103 MG Take 1 tablet by mouth 2 (two) times daily. 180 tablet 3   EPINEPHrine  0.3 mg/0.3 mL IJ SOAJ injection Inject 0.3 mg into the muscle as needed for anaphylaxis. 2 each 1   nitroGLYCERIN  (NITROSTAT ) 0.4 MG SL tablet Place 1 tablet (0.4 mg total) under the tongue every 5 (five) minutes as needed for chest pain. If you require more than two tablets five minutes apart go to the nearest ER via EMS. 30 tablet 0   Current Facility-Administered Medications on File Prior to Visit  Medication Dose Route Frequency Provider Last Rate Last Admin   omalizumab  (XOLAIR ) prefilled syringe 300 mg  300 mg Subcutaneous Q28 days Luke Orlan HERO, DO   300 mg at 11/28/23 1127  [2]  Current Outpatient Medications:    dapagliflozin  propanediol (FARXIGA ) 10 MG TABS tablet, Take 1 tablet (10 mg total) by mouth daily before breakfast., Disp: 90 tablet, Rfl: 3   hydrocortisone 2.5 % cream, Apply topically 2 (two) times daily., Disp: , Rfl:     ibuprofen  (ADVIL ) 600 MG tablet, Take 1 tablet (600 mg total) by mouth every 8 (eight) hours as needed., Disp: 30 tablet, Rfl: 0   montelukast  (SINGULAIR ) 10 MG tablet, TAKE 1 TABLET (10 MG TOTAL) BY MOUTH AT BEDTIME. FOR HIVE CONTROL, Disp: 90 tablet, Rfl: 1   rosuvastatin  (CRESTOR ) 40 MG tablet, TAKE 1 TABLET BY MOUTH EVERY DAY, Disp: 90 tablet, Rfl: 0  sacubitril -valsartan  (ENTRESTO ) 97-103 MG, Take 1 tablet by mouth 2 (two) times daily., Disp: 180 tablet, Rfl: 3   aspirin  EC (ASPIRIN  LOW DOSE) 81 MG tablet, Take 1 tablet (81 mg total) by mouth 5 (five) times daily. SWALLOW WHOLE., Disp: 90 tablet, Rfl: 3   EPINEPHrine  0.3 mg/0.3 mL IJ SOAJ injection, Inject 0.3 mg into the muscle as needed for anaphylaxis., Disp: 2 each, Rfl: 1   nitroGLYCERIN  (NITROSTAT ) 0.4 MG SL tablet, Place 1 tablet (0.4 mg total) under the tongue every 5 (five) minutes as needed for chest pain. If you require more than two tablets five minutes apart go to the nearest ER via EMS., Disp: 30 tablet, Rfl: 0  Current Facility-Administered Medications:    omalizumab  (XOLAIR ) prefilled syringe 300 mg, 300 mg, Subcutaneous, Q28 days, Luke Orlan HERO, DO, 300 mg at 11/28/23 1127  "

## 2024-01-17 ENCOUNTER — Ambulatory Visit

## 2024-01-17 ENCOUNTER — Ambulatory Visit: Payer: Self-pay | Admitting: Medical

## 2024-01-17 ENCOUNTER — Other Ambulatory Visit: Payer: Self-pay | Admitting: Medical

## 2024-01-17 LAB — LIPID PANEL
Chol/HDL Ratio: 2.4 ratio (ref 0.0–5.0)
Cholesterol, Total: 137 mg/dL (ref 100–199)
HDL: 57 mg/dL
LDL Chol Calc (NIH): 61 mg/dL (ref 0–99)
Triglycerides: 101 mg/dL (ref 0–149)
VLDL Cholesterol Cal: 19 mg/dL (ref 5–40)

## 2024-01-17 LAB — COMPREHENSIVE METABOLIC PANEL WITH GFR
ALT: 19 IU/L (ref 0–44)
AST: 28 IU/L (ref 0–40)
Albumin: 4.5 g/dL (ref 3.9–4.9)
Alkaline Phosphatase: 66 IU/L (ref 47–123)
BUN/Creatinine Ratio: 8 — ABNORMAL LOW (ref 10–24)
BUN: 8 mg/dL (ref 8–27)
Bilirubin Total: 0.4 mg/dL (ref 0.0–1.2)
CO2: 21 mmol/L (ref 20–29)
Calcium: 9.5 mg/dL (ref 8.6–10.2)
Chloride: 101 mmol/L (ref 96–106)
Creatinine, Ser: 0.98 mg/dL (ref 0.76–1.27)
Globulin, Total: 3 g/dL (ref 1.5–4.5)
Glucose: 83 mg/dL (ref 70–99)
Potassium: 4.2 mmol/L (ref 3.5–5.2)
Sodium: 139 mmol/L (ref 134–144)
Total Protein: 7.5 g/dL (ref 6.0–8.5)
eGFR: 83 mL/min/1.73

## 2024-01-17 LAB — URINALYSIS, ROUTINE W REFLEX MICROSCOPIC
Bilirubin, UA: NEGATIVE
Ketones, UA: NEGATIVE
Leukocytes,UA: NEGATIVE
Nitrite, UA: NEGATIVE
Protein,UA: NEGATIVE
RBC, UA: NEGATIVE
Specific Gravity, UA: 1.018 (ref 1.005–1.030)
Urobilinogen, Ur: 0.2 mg/dL (ref 0.2–1.0)
pH, UA: 6 (ref 5.0–7.5)

## 2024-01-17 LAB — ANA

## 2024-01-17 LAB — CBC WITH DIFFERENTIAL/PLATELET
Basophils Absolute: 0 x10E3/uL (ref 0.0–0.2)
Basos: 0 %
EOS (ABSOLUTE): 0.1 x10E3/uL (ref 0.0–0.4)
Eos: 3 %
Hematocrit: 50.7 % (ref 37.5–51.0)
Hemoglobin: 15.9 g/dL (ref 13.0–17.7)
Immature Grans (Abs): 0 x10E3/uL (ref 0.0–0.1)
Immature Granulocytes: 0 %
Lymphocytes Absolute: 2 x10E3/uL (ref 0.7–3.1)
Lymphs: 53 %
MCH: 27.4 pg (ref 26.6–33.0)
MCHC: 31.4 g/dL — ABNORMAL LOW (ref 31.5–35.7)
MCV: 87 fL (ref 79–97)
Monocytes Absolute: 0.5 x10E3/uL (ref 0.1–0.9)
Monocytes: 12 %
Neutrophils Absolute: 1.3 x10E3/uL — ABNORMAL LOW (ref 1.4–7.0)
Neutrophils: 32 %
Platelets: 233 x10E3/uL (ref 150–450)
RBC: 5.81 x10E6/uL — ABNORMAL HIGH (ref 4.14–5.80)
RDW: 13.1 % (ref 11.6–15.4)
WBC: 3.9 x10E3/uL (ref 3.4–10.8)

## 2024-01-17 LAB — PSA: Prostate Specific Ag, Serum: 1.1 ng/mL (ref 0.0–4.0)

## 2024-01-17 LAB — HIGH SENSITIVITY CRP: CRP, High Sensitivity: 0.47 mg/L (ref 0.00–3.00)

## 2024-01-17 LAB — SEDIMENTATION RATE: Sed Rate: 31 mm/h — ABNORMAL HIGH (ref 0–30)

## 2024-01-17 NOTE — Progress Notes (Signed)
 Results thru my chart

## 2024-01-18 ENCOUNTER — Ambulatory Visit (INDEPENDENT_AMBULATORY_CARE_PROVIDER_SITE_OTHER): Admitting: Allergy

## 2024-01-18 ENCOUNTER — Encounter: Payer: Self-pay | Admitting: Allergy

## 2024-01-18 ENCOUNTER — Ambulatory Visit (INDEPENDENT_AMBULATORY_CARE_PROVIDER_SITE_OTHER)

## 2024-01-18 ENCOUNTER — Other Ambulatory Visit: Payer: Self-pay

## 2024-01-18 VITALS — BP 122/88 | HR 78 | Temp 98.3°F | Resp 16 | Ht 68.0 in | Wt 158.4 lb

## 2024-01-18 DIAGNOSIS — L501 Idiopathic urticaria: Secondary | ICD-10-CM | POA: Diagnosis not present

## 2024-01-18 DIAGNOSIS — M7989 Other specified soft tissue disorders: Secondary | ICD-10-CM

## 2024-01-18 MED ORDER — PREDNISONE 20 MG PO TABS: 20.0000 mg | ORAL_TABLET | Freq: Every day | ORAL | 0 refills | Status: AC

## 2024-01-18 MED ORDER — FEXOFENADINE HCL 180 MG PO TABS: ORAL_TABLET | ORAL | 2 refills | Status: AC

## 2024-01-18 MED ORDER — FAMOTIDINE 20 MG PO TABS: 20.0000 mg | ORAL_TABLET | Freq: Two times a day (BID) | ORAL | 2 refills | Status: AC

## 2024-01-18 NOTE — Patient Instructions (Addendum)
 Hives: Start prednisone  20mg  once a day for 5 days.  Restart Xolair  300mg  injections every 4 weeks.  For mild symptoms you can take over the counter antihistamines (zyrtec 10mg  to 20mg ) and monitor symptoms closely.  If symptoms worsen or if you have severe symptoms including breathing issues, throat closure, significant swelling, whole body hives, severe diarrhea and vomiting, lightheadedness then use epinephrine  and seek immediate medical care afterwards. Emergency action plan in place.   Start allegra  (fexofenadine ) 180 to 360 mg (1-2 tablets) twice a day. If symptoms are not controlled or causes drowsiness let us  know. Start Pepcid  (famotidine ) 20mg  twice a day.  Avoid the following potential triggers: alcohol, tight clothing, NSAIDs, hot showers and getting overheated. Continue proper skin care.   Follow up in 2 months with Dr. Jeneal.  Skin care recommendations  Bath time: Always use lukewarm water. AVOID very hot or cold water. Keep bathing time to 5-10 minutes. Do NOT use bubble bath. Use a mild soap and use just enough to wash the dirty areas. Do NOT scrub skin vigorously.  After bathing, pat dry your skin with a towel. Do NOT rub or scrub the skin.  Moisturizers and prescriptions:  ALWAYS apply moisturizers immediately after bathing (within 3 minutes). This helps to lock-in moisture. Use the moisturizer several times a day over the whole body. Good summer moisturizers include: Aveeno, CeraVe, Cetaphil. Good winter moisturizers include: Aquaphor, Vaseline, Cerave, Cetaphil, Eucerin, Vanicream. When using moisturizers along with medications, the moisturizer should be applied about one hour after applying the medication to prevent diluting effect of the medication or moisturize around where you applied the medications. When not using medications, the moisturizer can be continued twice daily as maintenance.  Laundry and clothing: Avoid laundry products with added color or  perfumes. Use unscented hypo-allergenic laundry products such as Tide free, Cheer free & gentle, and All free and clear.  If the skin still seems dry or sensitive, you can try double-rinsing the clothes. Avoid tight or scratchy clothing such as wool. Do not use fabric softeners or dyer sheets.

## 2024-01-18 NOTE — Progress Notes (Signed)
 "  Follow Up Note  RE: Adrian Willis MRN: [id number] DOB: Sep 24, 1955 Date of Office Visit: 01/18/2024  Referring provider: Bulah Alm RAMAN, PA-C Primary care provider: Bulah Alm RAMAN, PA-C  Chief Complaint: Follow-up (He says his last injection of Xolair  since 6 weeks apart and he had some episodes of reaction - both hands swelling that were last for 12 hours. He took allergy tablet and hydrocortisone 2.5 % that helped. No issue with breathing. )  History of Present Illness: I had the pleasure of seeing Adrian Willis for a follow up visit at the [facility] on 01/18/2024. He is a 68 y.o. male, who is being followed for ciu on Xolair . His previous allergy office visit was on 11/28/2023 with Adrian Mutter, FNP. Today is a new complaint visit of hives and swelling.   Discussed the use of AI scribe software for clinical note transcription with the patient, who gave verbal consent to proceed.    After his last Xolair  injection in November, he experienced severe hives and swelling of the hands the following day. Other symptoms included difficulty swallowing, and hives on his armpit, palms, and wrists. These symptoms persisted for four days initially and have continued to occur nightly since then.  The hives flare up every night around 10 or 11 PM, causing significant itching and discomfort, which resolves by 5 AM. He has been taking an over-the-counter allergy tablet from [facility] each night to manage the symptoms.   He previously stopped taking Allegra  and famotidine , which he was initially prescribed when he started Xolair , as he felt better and was advised to reduce the frequency of Xolair  to every six weeks. However, since the last injection, he has not resumed these medications.  He also mentions experiencing bloating at night, which feels like medication is coming back up, but he does not associate this with reflux.   He takes aspirin  81 mg once daily, which was  reduced from 325 mg due to concerns about its strength. He reports having had a stroke in the past.   He reports no fever, chills, recent infections, or significant lifestyle changes. He experienced stress due to credit card fraud recently.     Assessment and Plan: Adrian Willis is a 68 y.o. male with: Chronic idiopathic urticaria Bilateral hand swelling Recurrent hives and swelling post-Xolair , possibly due to stress, increased intervals between Xolair  injections and cessation of antihistamines. Current Xolair  300mg  every 6 week schedule may be inadequate. Evaluated alternative medications such as Rhapsido but concerned about bleeding risk with aspirin  and stroke history. Decided to revert to previous antihistamine regimen and increase Xolair  frequency. Start prednisone  20mg  once a day for 5 days.  Restart Xolair  300mg  injections every 4 weeks - given today.  For mild symptoms you can take over the counter antihistamines (zyrtec 10mg  to 20mg ) and monitor symptoms closely.  If symptoms worsen or if you have severe symptoms including breathing issues, throat closure, significant swelling, whole body hives, severe diarrhea and vomiting, lightheadedness then use epinephrine  and seek immediate medical care afterwards. Emergency action plan in place.   Start allegra  (fexofenadine ) 180 to 360 mg (1-2 tablets) twice a day. If symptoms are not controlled or causes drowsiness let us  know. Start Pepcid  (famotidine ) 20mg  twice a day.  Avoid the following potential triggers: alcohol, tight clothing, NSAIDs, hot showers and getting overheated. Continue proper skin care.   Return in about 2 months (around 03/17/2024).  Meds ordered this encounter  Medications   predniSONE  (  DELTASONE ) 20 MG tablet    Sig: Take 1 tablet (20 mg total) by mouth daily with breakfast.    Dispense:  5 tablet    Refill:  0   famotidine  (PEPCID ) 20 MG tablet    Sig: Take 1 tablet (20 mg total) by mouth 2 (two) times daily.     Dispense:  60 tablet    Refill:  2   fexofenadine  (ALLEGRA  ALLERGY) 180 MG tablet    Sig: Take 1-2 tablets twice a day for hives.    Dispense:  120 tablet    Refill:  2   Lab Orders  No laboratory test(s) ordered today    Diagnostics: None.   Medication List:  Current Outpatient Medications  Medication Sig Dispense Refill   aspirin  EC (ASPIRIN  LOW DOSE) 81 MG tablet Take 1 tablet (81 mg total) by mouth 5 (five) times daily. SWALLOW WHOLE. 90 tablet 3   dapagliflozin  propanediol (FARXIGA ) 10 MG TABS tablet Take 1 tablet (10 mg total) by mouth daily before breakfast. 90 tablet 3   EPINEPHrine  0.3 mg/0.3 mL IJ SOAJ injection Inject 0.3 mg into the muscle as needed for anaphylaxis. 2 each 1   famotidine  (PEPCID ) 20 MG tablet Take 1 tablet (20 mg total) by mouth 2 (two) times daily. 60 tablet 2   fexofenadine  (ALLEGRA  ALLERGY) 180 MG tablet Take 1-2 tablets twice a day for hives. 120 tablet 2   hydrocortisone cream 1 % Apply 1 Application topically 2 (two) times daily. 454 g 0   ibuprofen  (ADVIL ) 600 MG tablet Take 1 tablet (600 mg total) by mouth every 8 (eight) hours as needed. 30 tablet 0   montelukast  (SINGULAIR ) 10 MG tablet TAKE 1 TABLET (10 MG TOTAL) BY MOUTH AT BEDTIME. FOR HIVE CONTROL 90 tablet 1   predniSONE  (DELTASONE ) 20 MG tablet Take 1 tablet (20 mg total) by mouth daily with breakfast. 5 tablet 0   rosuvastatin  (CRESTOR ) 40 MG tablet TAKE 1 TABLET BY MOUTH EVERY DAY 90 tablet 0   sacubitril -valsartan  (ENTRESTO ) 97-103 MG Take 1 tablet by mouth 2 (two) times daily. 180 tablet 3   nitroGLYCERIN  (NITROSTAT ) 0.4 MG SL tablet Place 1 tablet (0.4 mg total) under the tongue every 5 (five) minutes as needed for chest pain. If you require more than two tablets five minutes apart go to the nearest ER via EMS. (Patient not taking: Reported on 01/18/2024) 30 tablet 0   Current Facility-Administered Medications  Medication Dose Route Frequency Provider Last Rate Last Admin   omalizumab   (XOLAIR ) prefilled syringe 300 mg  300 mg Subcutaneous Q28 days Luke Orlan HERO, DO   300 mg at 01/18/24 1042   Allergies: Allergies[1] I reviewed his past medical history, social history, family history, and environmental history and no significant changes have been reported from his previous visit.  Review of Systems  Constitutional:  Negative for appetite change, chills, fever and unexpected weight change.  HENT:  Negative for congestion and rhinorrhea.   Eyes:  Negative for itching.  Respiratory:  Negative for cough, chest tightness, shortness of breath and wheezing.   Cardiovascular:  Negative for chest pain.  Gastrointestinal:  Negative for abdominal pain.  Genitourinary:  Negative for difficulty urinating.  Skin:  Positive for rash.  Neurological:  Negative for headaches.    Objective: BP 122/88 (BP Location: Left Arm, Patient Position: Sitting, Cuff Size: Normal)   Pulse 78   Temp 98.3 F (36.8 C) (Temporal)   Resp 16   Ht 5' 8 (  1.727 m)   Wt 158 lb 6.4 oz (71.8 kg)   SpO2 97%   BMI 24.08 kg/m  Body mass index is 24.08 kg/m. Physical Exam Vitals and nursing note reviewed.  Constitutional:      Appearance: Normal appearance. He is well-developed.  HENT:     Head: Normocephalic and atraumatic.     Right Ear: Tympanic membrane and external ear normal.     Left Ear: Tympanic membrane and external ear normal.     Nose: Nose normal.     Mouth/Throat:     Mouth: Mucous membranes are moist.     Pharynx: Oropharynx is clear.  Eyes:     Conjunctiva/sclera: Conjunctivae normal.  Cardiovascular:     Rate and Rhythm: Normal rate and regular rhythm.     Heart sounds: Normal heart sounds. No murmur heard.    No friction rub. No gallop.  Pulmonary:     Effort: Pulmonary effort is normal.     Breath sounds: Normal breath sounds. No wheezing, rhonchi or rales.  Musculoskeletal:     Cervical back: Neck supple.  Skin:    General: Skin is warm.     Findings: No rash.   Neurological:     Mental Status: He is alert and oriented to person, place, and time.  Psychiatric:        Behavior: Behavior normal.    Previous notes and tests were reviewed. The plan was reviewed with the patient/family, and all questions/concerned were addressed.  It was my pleasure to see Adrian Willis today and participate in his care. Please feel free to contact me with any questions or concerns.  Sincerely,  Orlan Cramp, DO Allergy & Immunology  [facility]  [facility] office: [phone] [facility] office: [phone]     [1] No Known Allergies  "

## 2024-02-16 ENCOUNTER — Ambulatory Visit

## 2024-02-28 ENCOUNTER — Ambulatory Visit

## 2024-03-01 ENCOUNTER — Ambulatory Visit

## 2024-03-02 ENCOUNTER — Ambulatory Visit: Payer: Self-pay | Admitting: Medical

## 2025-01-21 ENCOUNTER — Encounter: Admitting: Medical
